# Patient Record
Sex: Male | Born: 1975 | Race: White | Hispanic: No | Marital: Married | Smoking: Never smoker
Health system: Southern US, Community
[De-identification: ages and names within clinical notes are randomized; demographics above are authoritative.]

## PROBLEM LIST (undated history)

## (undated) DIAGNOSIS — J342 Deviated nasal septum: Principal | ICD-10-CM

## (undated) DIAGNOSIS — F419 Anxiety disorder, unspecified: Secondary | ICD-10-CM

## (undated) DIAGNOSIS — E781 Pure hyperglyceridemia: Secondary | ICD-10-CM

## (undated) DIAGNOSIS — F32A Depression, unspecified: Secondary | ICD-10-CM

## (undated) DIAGNOSIS — M5126 Other intervertebral disc displacement, lumbar region: Secondary | ICD-10-CM

## (undated) DIAGNOSIS — Z98811 Dental restoration status: Secondary | ICD-10-CM

## (undated) DIAGNOSIS — F329 Major depressive disorder, single episode, unspecified: Secondary | ICD-10-CM

## (undated) DIAGNOSIS — F429 Obsessive-compulsive disorder, unspecified: Principal | ICD-10-CM

## (undated) DIAGNOSIS — G4730 Sleep apnea, unspecified: Secondary | ICD-10-CM

## (undated) DIAGNOSIS — J343 Hypertrophy of nasal turbinates: Secondary | ICD-10-CM

## (undated) HISTORY — DX: Sleep apnea, unspecified: G47.30

## (undated) HISTORY — DX: Anxiety disorder, unspecified: F41.9

## (undated) HISTORY — DX: Obsessive-compulsive disorder, unspecified: F42.9

## (undated) HISTORY — PX: APPENDECTOMY: SHX54

---

## 2005-09-30 ENCOUNTER — Ambulatory Visit (HOSPITAL_COMMUNITY): Payer: Self-pay | Admitting: Psychiatry

## 2011-03-13 ENCOUNTER — Other Ambulatory Visit: Payer: Self-pay | Admitting: Family Medicine

## 2011-03-13 DIAGNOSIS — R7989 Other specified abnormal findings of blood chemistry: Secondary | ICD-10-CM

## 2011-03-17 ENCOUNTER — Ambulatory Visit
Admission: RE | Admit: 2011-03-17 | Discharge: 2011-03-17 | Disposition: A | Discharge status: Home / Self Care | Payer: BC Managed Care – PPO | Source: Ambulatory Visit | Attending: Family Medicine | Admitting: Family Medicine

## 2011-03-17 DIAGNOSIS — R7989 Other specified abnormal findings of blood chemistry: Secondary | ICD-10-CM

## 2011-04-15 ENCOUNTER — Encounter (HOSPITAL_BASED_OUTPATIENT_CLINIC_OR_DEPARTMENT_OTHER): Payer: BC Managed Care – PPO

## 2011-04-22 ENCOUNTER — Encounter: Payer: Self-pay | Admitting: Family Medicine

## 2011-11-23 ENCOUNTER — Ambulatory Visit (INDEPENDENT_AMBULATORY_CARE_PROVIDER_SITE_OTHER): Payer: 59

## 2011-11-23 DIAGNOSIS — R1013 Epigastric pain: Secondary | ICD-10-CM

## 2011-11-26 ENCOUNTER — Other Ambulatory Visit: Payer: Self-pay | Admitting: Internal Medicine

## 2011-12-28 ENCOUNTER — Other Ambulatory Visit: Payer: Self-pay | Admitting: Family Medicine

## 2011-12-28 DIAGNOSIS — J329 Chronic sinusitis, unspecified: Secondary | ICD-10-CM

## 2011-12-30 ENCOUNTER — Ambulatory Visit
Admission: RE | Admit: 2011-12-30 | Discharge: 2011-12-30 | Disposition: A | Discharge status: Home / Self Care | Payer: No Typology Code available for payment source | Source: Ambulatory Visit | Attending: Family Medicine | Admitting: Family Medicine

## 2011-12-30 DIAGNOSIS — J329 Chronic sinusitis, unspecified: Secondary | ICD-10-CM

## 2012-08-04 ENCOUNTER — Other Ambulatory Visit: Payer: Self-pay

## 2012-08-09 ENCOUNTER — Other Ambulatory Visit: Payer: Self-pay

## 2012-08-23 ENCOUNTER — Other Ambulatory Visit: Payer: Self-pay | Admitting: Family Medicine

## 2012-08-23 ENCOUNTER — Ambulatory Visit
Admission: RE | Admit: 2012-08-23 | Discharge: 2012-08-23 | Disposition: A | Discharge status: Home / Self Care | Payer: BC Managed Care – PPO | Source: Ambulatory Visit | Attending: Family Medicine | Admitting: Family Medicine

## 2012-08-23 DIAGNOSIS — R102 Pelvic and perineal pain unspecified side: Secondary | ICD-10-CM

## 2012-08-23 DIAGNOSIS — N5082 Scrotal pain: Secondary | ICD-10-CM

## 2012-12-27 ENCOUNTER — Encounter: Payer: Self-pay | Admitting: *Deleted

## 2013-01-11 ENCOUNTER — Emergency Department (HOSPITAL_COMMUNITY)
Admission: EM | Admit: 2013-01-11 | Discharge: 2013-01-11 | Disposition: A | Discharge status: Home / Self Care | Payer: No Typology Code available for payment source | Attending: Emergency Medicine | Admitting: Emergency Medicine

## 2013-01-11 ENCOUNTER — Encounter (HOSPITAL_COMMUNITY): Payer: Self-pay | Admitting: Emergency Medicine

## 2013-01-11 DIAGNOSIS — Z79899 Other long term (current) drug therapy: Secondary | ICD-10-CM | POA: Insufficient documentation

## 2013-01-11 DIAGNOSIS — F411 Generalized anxiety disorder: Secondary | ICD-10-CM | POA: Insufficient documentation

## 2013-01-11 DIAGNOSIS — G479 Sleep disorder, unspecified: Secondary | ICD-10-CM | POA: Insufficient documentation

## 2013-01-11 DIAGNOSIS — Z862 Personal history of diseases of the blood and blood-forming organs and certain disorders involving the immune mechanism: Secondary | ICD-10-CM | POA: Insufficient documentation

## 2013-01-11 DIAGNOSIS — F419 Anxiety disorder, unspecified: Secondary | ICD-10-CM

## 2013-01-11 DIAGNOSIS — Z8719 Personal history of other diseases of the digestive system: Secondary | ICD-10-CM | POA: Insufficient documentation

## 2013-01-11 DIAGNOSIS — Z8639 Personal history of other endocrine, nutritional and metabolic disease: Secondary | ICD-10-CM | POA: Insufficient documentation

## 2013-01-11 LAB — URINALYSIS, ROUTINE W REFLEX MICROSCOPIC
Glucose, UA: NEGATIVE mg/dL
Ketones, ur: NEGATIVE mg/dL
Leukocytes, UA: NEGATIVE
Nitrite: NEGATIVE
Protein, ur: NEGATIVE mg/dL
pH: 5.5 (ref 5.0–8.0)

## 2013-01-11 LAB — CBC
HCT: 43.4 % (ref 39.0–52.0)
MCH: 30.7 pg (ref 26.0–34.0)
MCHC: 35.3 g/dL (ref 30.0–36.0)
MCV: 87 fL (ref 78.0–100.0)
Platelets: 222 10*3/uL (ref 150–400)
RDW: 13.1 % (ref 11.5–15.5)

## 2013-01-11 LAB — BASIC METABOLIC PANEL
BUN: 18 mg/dL (ref 6–23)
Calcium: 9.4 mg/dL (ref 8.4–10.5)
Creatinine, Ser: 0.94 mg/dL (ref 0.50–1.35)
GFR calc Af Amer: >90 mL/min (ref >90)

## 2013-01-11 LAB — RAPID URINE DRUG SCREEN, HOSP PERFORMED
Barbiturates: NOT DETECTED
Benzodiazepines: NOT DETECTED

## 2013-01-11 LAB — ETHANOL: Alcohol, Ethyl (B): <11 mg/dL (ref 0–11)

## 2013-01-11 MED ORDER — LORAZEPAM 1 MG PO TABS
1.0000 mg | ORAL_TABLET | Freq: Once | ORAL | Status: AC
  Administered 2013-01-11: 1 mg via ORAL
  Filled 2013-01-11: qty 1

## 2013-01-11 MED ORDER — ONDANSETRON 4 MG PO TBDP
4.0000 mg | ORAL_TABLET | Freq: Once | ORAL | Status: AC
Start: 2013-01-11 — End: 2013-01-11
  Administered 2013-01-11: 4 mg via ORAL
  Filled 2013-01-11: qty 1

## 2013-01-11 MED ORDER — ALPRAZOLAM 0.5 MG PO TABS: 0.5000 mg | ORAL_TABLET | Freq: Three times a day (TID) | ORAL | Status: DC | PRN

## 2013-01-11 NOTE — ED Notes (Signed)
TelePsych faxed and initiated.

## 2013-01-11 NOTE — ED Notes (Signed)
Per EMS, pt felt anxious yesterday with "mood swings" and periods of sadness.  Pt has hx of anxiety since he was 19. Pt used to take Effexor, Ambien PRN.  Pt took Benadryl 50 mg for sleep last night with reverse effects and felt more anxious.  Pt vomited with EMS, Zofran given en route.

## 2013-01-11 NOTE — ED Notes (Signed)
[id number]<AV> Expected date:<BR> Expected time:<BR> Means of arrival:<BR> Comments:<BR> Medic 212, 36 M, Anxiety

## 2013-01-11 NOTE — ED Provider Notes (Signed)
0900.  Received sign-over at 0730.  Pt was awaiting evaluation and recommendations by psychiatrist.  He has denied SI/HI.  He will b discharged home with instructions to f/u with a local mental health professional.  He will be prescribed xanax to be used as needed for acute anxiety/panic.   Tobin Chad, MD 01/11/13 [phone]

## 2013-01-11 NOTE — ED Provider Notes (Signed)
History     CSN: [id number]  Arrival date & time 01/11/13  0309   First MD Initiated Contact with Patient 01/11/13 [phone]      Chief Complaint  Patient presents with  . Anxiety  . Emesis    (Consider location/radiation/quality/duration/timing/severity/associated sxs/prior treatment) HPI History provided by patient. At home tonight had a severe anxiety reaction. He tells me it was so severe he was having thoughts of hurting his wife and family.  No suicidal ideation. He called EMS.  He has not male asleep the last few weeks. he was recently prescribed Ambien by his primary care physician Dr. Tanya Nones [facility].  Medication not working so tonight instead he took Benadryl. Shortly afterwards developed this anxiety reaction - history of same but never this bad.  Has seen a psychologist in the past and was previously on psych medications.  Has remote history of suicidal ideation with psych admission. 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  Past Medical History  Diagnosis Date  . NASH (nonalcoholic steatohepatitis)   . Hyperlipidemia     Past Surgical History  Procedure Laterality Date  . Appendectomy      Family History  Problem Relation Age of Onset  . Heart disease Maternal Uncle 52    heart attack  . Heart disease Maternal Uncle 61    CVA    History  Substance Use Topics  . Smoking status: Never Smoker   . Smokeless tobacco: Not on file  . Alcohol Use: Not on file      Review of Systems  Constitutional: Negative for fever and chills.  HENT: Negative for neck pain and neck stiffness.   Eyes: Negative for pain.  Respiratory: Negative for shortness of breath.   Cardiovascular: Negative for chest pain.  Gastrointestinal: Negative for abdominal pain.  Genitourinary: Negative for dysuria.  Musculoskeletal: Negative for back pain.  Skin: Negative for rash.  Neurological: Negative for headaches.  Psychiatric/Behavioral:  Positive for sleep disturbance. The patient is nervous/anxious.   All other systems reviewed and are negative.    Allergies  Review of patient's allergies indicates no known allergies.  Home Medications   Current Outpatient Rx  Name  Route  Sig  Dispense  Refill  . diphenhydrAMINE (BENADRYL) 25 MG tablet   Oral   Take 25 mg by mouth at bedtime as needed for sleep.         Marland Kitchen venlafaxine (EFFEXOR) 37.5 MG tablet   Oral   Take 75 mg by mouth daily.          Marland Kitchen zolpidem (AMBIEN) 10 MG tablet   Oral   Take 10 mg by mouth at bedtime as needed for sleep.           BP 149/94  Pulse 81  Temp(Src) 98.6 F (37 C) (Oral)  Resp 22  SpO2 96%  Physical Exam  Constitutional: He is oriented to person, place, and time. He appears well-developed and well-nourished.  HENT:  Head: Normocephalic and atraumatic.  Eyes: EOM are normal. Pupils are equal, round, and reactive to light.  Neck: Neck supple.  Cardiovascular: Regular rhythm and intact distal pulses.   Pulmonary/Chest: Effort normal. No respiratory distress.  Musculoskeletal: Normal range of motion. He exhibits no edema.  Neurological: He is alert and oriented to person, place, and time.  Skin: Skin is warm and dry.  Psychiatric: His behavior is normal. Judgment and thought content normal.  Anxious    ED  Course  Procedures (including critical care time)  Results for orders placed during the hospital encounter of 01/11/13  CBC      Result Value Range   WBC 9.0  4.0 - 10.5 K/uL   RBC 4.99  4.22 - 5.81 MIL/uL   Hemoglobin 15.3  13.0 - 17.0 g/dL   HCT 16.1  09.6 - 04.5 %   MCV 87.0  78.0 - 100.0 fL   MCH 30.7  26.0 - 34.0 pg   MCHC 35.3  30.0 - 36.0 g/dL   RDW 40.9  81.1 - 91.4 %   Platelets 222  150 - 400 K/uL    Ativan provided.  5:53 AM discussed with ACT - will evaluate   MDM  Anxiety, sleep disturbance and thoughts of harming family at home  ACT and telepsych consult requested  Labs, UA/  UDS        Sunnie Nielsen, MD 01/11/13 [phone]

## 2013-01-24 ENCOUNTER — Telehealth: Payer: Self-pay | Admitting: Family Medicine

## 2013-01-24 DIAGNOSIS — F419 Anxiety disorder, unspecified: Secondary | ICD-10-CM

## 2013-01-24 MED ORDER — ALPRAZOLAM 0.5 MG PO TABS: 0.5000 mg | ORAL_TABLET | Freq: Three times a day (TID) | ORAL | Status: DC | PRN

## 2013-01-24 NOTE — Telephone Encounter (Signed)
Medication refilled per protocol.Patient needs to be seen before any further refills 

## 2013-01-24 NOTE — Telephone Encounter (Signed)
CPE 11/29/2012  Last refill 01/12/2013 #30.  Xanax 0.5mg  1-2 tabs Q8hrs as needed for panic attacks Need approval for controlled medication.Marland Kitchen

## 2013-01-24 NOTE — Telephone Encounter (Signed)
Ok to call out 30, NTBS if getting worse

## 2013-02-06 ENCOUNTER — Telehealth: Payer: Self-pay | Admitting: Family Medicine

## 2013-02-06 ENCOUNTER — Encounter: Payer: Self-pay | Admitting: Family Medicine

## 2013-02-06 ENCOUNTER — Ambulatory Visit (INDEPENDENT_AMBULATORY_CARE_PROVIDER_SITE_OTHER): Payer: PRIVATE HEALTH INSURANCE | Admitting: Family Medicine

## 2013-02-06 VITALS — BP 120/78 | HR 80 | Temp 101.3°F | Resp 20 | Wt 210.0 lb

## 2013-02-06 DIAGNOSIS — J020 Streptococcal pharyngitis: Secondary | ICD-10-CM

## 2013-02-06 DIAGNOSIS — F419 Anxiety disorder, unspecified: Secondary | ICD-10-CM

## 2013-02-06 DIAGNOSIS — F411 Generalized anxiety disorder: Secondary | ICD-10-CM | POA: Insufficient documentation

## 2013-02-06 LAB — RAPID STREP SCREEN (MED CTR MEBANE ONLY)

## 2013-02-06 MED ORDER — ALPRAZOLAM 0.5 MG PO TABS: 0.5000 mg | ORAL_TABLET | Freq: Three times a day (TID) | ORAL | Status: DC | PRN

## 2013-02-06 MED ORDER — AMOXICILLIN 875 MG PO TABS: 875.0000 mg | ORAL_TABLET | Freq: Two times a day (BID) | ORAL | Status: DC

## 2013-02-06 NOTE — Telephone Encounter (Signed)
Discussed at office visit.

## 2013-02-06 NOTE — Telephone Encounter (Signed)
?  ok to refill °

## 2013-02-06 NOTE — Progress Notes (Signed)
Subjective:     Patient ID: Robert Mcpherson, male   DOB: Mar 09, 1976, 37 y.o.   MRN: [id number]  HPI  Sore throat since Saturday. Fever to 101. Nausea and vomiting. No cough no rhinorrhea. No diarrhea. No rash. Review of Systems  Constitutional: Positive for fever, chills, activity change, appetite change and fatigue.  HENT: Positive for neck pain. Negative for ear pain, congestion, rhinorrhea and sneezing.   Respiratory: Negative.   Cardiovascular: Negative.   Gastrointestinal: Positive for nausea, vomiting and abdominal pain. Negative for diarrhea.  Musculoskeletal: Positive for myalgias.  Skin: Negative.        Objective:   Physical Exam  Constitutional: He appears well-developed and well-nourished.  HENT:  Right Ear: Tympanic membrane normal.  Left Ear: Tympanic membrane normal.  Nose: Nose normal.  Mouth/Throat: Uvula is midline and mucous membranes are normal. Oropharyngeal exudate, posterior oropharyngeal edema and posterior oropharyngeal erythema present. No tonsillar abscesses.       Assessment:    Tonsillitis   Plan:    1. Streptococcal sore throat Amoxicillin 875 mg by mouth twice a day x10 days. - Rapid strep screen #2 anxiety disorder-refill Xanax 0.5 mg by mouth every 8 hours when necessary and gave him 30 pills. Instructed the patient that this is 30 pills and just 2 weeks indicating he's using it twice a day. Stated that when he did come up with a better preventative medicine. When he is not as ill I asked him to come back in so we can discuss better preventative strategies .

## 2013-02-06 NOTE — Telephone Encounter (Signed)
Too soon, that's 2 per day.  We need to discuss better options.  Can have 30 more until he can be seen.

## 2013-02-08 ENCOUNTER — Telehealth: Payer: Self-pay | Admitting: Family Medicine

## 2013-02-08 DIAGNOSIS — R110 Nausea: Secondary | ICD-10-CM

## 2013-02-08 MED ORDER — PROMETHAZINE HCL 25 MG PO TABS: ORAL_TABLET | ORAL | Status: DC

## 2013-02-08 NOTE — Telephone Encounter (Signed)
Ok to call in; Phenergan 25mg   1/2 to 1 Q 4-6hprn nausea. # 30/0. Caution him reg drowsiness. Donot take prior to driving/work.

## 2013-02-08 NOTE — Telephone Encounter (Signed)
Pt called and Rx sent to pharmacy.

## 2013-02-08 NOTE — Telephone Encounter (Signed)
Treated for strep throat on Monday. On Amoxicillin. Throat feeling better but still a lot of nausea with eating.  Diff to eat anything.  Can he have something to calm his stomach down.

## 2013-02-16 ENCOUNTER — Telehealth: Payer: Self-pay | Admitting: Family Medicine

## 2013-02-16 MED ORDER — CIPROFLOXACIN HCL 500 MG PO TABS: 500.0000 mg | ORAL_TABLET | Freq: Two times a day (BID) | ORAL | Status: DC

## 2013-02-16 NOTE — Telephone Encounter (Signed)
cipro 500 bid, for 7 days, ntbs if no better

## 2013-02-16 NOTE — Telephone Encounter (Signed)
Pt called told rx sent to [facility].  If not better after abx will NTBS.

## 2013-02-22 ENCOUNTER — Ambulatory Visit (INDEPENDENT_AMBULATORY_CARE_PROVIDER_SITE_OTHER): Payer: PRIVATE HEALTH INSURANCE | Admitting: Physician Assistant

## 2013-02-22 ENCOUNTER — Encounter: Payer: Self-pay | Admitting: Physician Assistant

## 2013-02-22 VITALS — BP 144/86 | HR 80 | Temp 98.4°F | Resp 20 | Ht 68.25 in | Wt 202.0 lb

## 2013-02-22 DIAGNOSIS — J029 Acute pharyngitis, unspecified: Secondary | ICD-10-CM

## 2013-02-22 DIAGNOSIS — R5383 Other fatigue: Secondary | ICD-10-CM

## 2013-02-22 DIAGNOSIS — R300 Dysuria: Secondary | ICD-10-CM

## 2013-02-22 DIAGNOSIS — R5381 Other malaise: Secondary | ICD-10-CM

## 2013-02-22 LAB — URINALYSIS, MICROSCOPIC ONLY

## 2013-02-22 LAB — CBC WITH DIFFERENTIAL/PLATELET
Basophils Absolute: 0 10*3/uL (ref 0.0–0.1)
Basophils Relative: 1 % (ref 0–1)
Eosinophils Absolute: 0.1 10*3/uL (ref 0.0–0.7)
Eosinophils Relative: 2 % (ref 0–5)
Lymphs Abs: 2.2 10*3/uL (ref 0.7–4.0)
MCH: 30.3 pg (ref 26.0–34.0)
MCHC: 34.7 g/dL (ref 30.0–36.0)
MCV: 87.2 fL (ref 78.0–100.0)
Neutrophils Relative %: 57 % (ref 43–77)
Platelets: 308 10*3/uL (ref 150–400)
RDW: 14.2 % (ref 11.5–15.5)

## 2013-02-22 LAB — COMPLETE METABOLIC PANEL WITH GFR
ALT: 23 U/L (ref 0–53)
Alkaline Phosphatase: 70 U/L (ref 39–117)
CO2: 30 mEq/L (ref 19–32)
Creat: 0.97 mg/dL (ref 0.50–1.35)
GFR, Est African American: >89 mL/min
Total Bilirubin: 0.3 mg/dL (ref 0.3–1.2)

## 2013-02-22 LAB — URINALYSIS, ROUTINE W REFLEX MICROSCOPIC
Bilirubin Urine: NEGATIVE
Glucose, UA: NEGATIVE mg/dL
Protein, ur: NEGATIVE mg/dL
Urobilinogen, UA: 0.2 mg/dL (ref 0.0–1.0)

## 2013-02-22 LAB — TSH: TSH: 1.437 u[IU]/mL (ref 0.350–4.500)

## 2013-02-22 MED ORDER — CIPROFLOXACIN HCL 500 MG PO TABS: 500.0000 mg | ORAL_TABLET | Freq: Two times a day (BID) | ORAL | Status: DC

## 2013-02-23 LAB — MONONUCLEOSIS SCREEN: Mono Screen: NEGATIVE

## 2013-02-23 NOTE — Progress Notes (Signed)
Patient ID: Robert Mcpherson MRN: [id number], DOB: [birth year], 37 y.o. Date of Encounter: @DATE @  Chief Complaint:  Chief Complaint  Patient presents with  . c/o unresolved uti and extreme fatigue    see ph mess last week  . painful intercourse    HPI: 37 y.o. year old male  presents with c/o:  1. On 02/16/13 he called with c/o dysuria. Cipro 500mg  BID x 7 days was called in. (No UA was done). He took that as directed. Took last dose today. Now is no longer has burning with urination but 2 days ago when had sex, hashe had severe pain and burning in his penis with ejaculation. Has seen no penile discharge. Has seen no blood from penis. No testicular pain or tenderness. Knows that he absolutely has been with no one except his wife and he know for certain that she has been with no one else. Refuses to test for STD.   2. Also c/o severe fatigue and exhaustion. 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 "Cant afford to be exhausted."" Wants to check everything possible to check" (except stds-see above). Had strep throat recently. No longer has sore throat but "wants to make sure strep is gone." No other area of pain or problem. No fever/chills.   Past Medical History  Diagnosis Date  . NASH (nonalcoholic steatohepatitis)   . Hyperlipidemia      Home Meds: Current Outpatient Prescriptions on File Prior to Visit  Medication Sig Dispense Refill  . ALPRAZolam (XANAX) 0.5 MG tablet Take 1 tablet (0.5 mg total) by mouth 3 (three) times daily as needed for anxiety.  30 tablet  0  . ciprofloxacin (CIPRO) 500 MG tablet Take 1 tablet (500 mg total) by mouth 2 (two) times daily.  14 tablet  0  . amoxicillin (AMOXIL) 875 MG tablet Take 1 tablet (875 mg total) by mouth 2 (two) times daily.  20 tablet  0  . promethazine (PHENERGAN) 25 MG tablet Take 1/2 to 1 tab every 4-6 hrs as needed for nausea.  30 tablet  0   No current facility-administered medications on file prior to visit.    Allergies: No  Known Allergies  History   Social History  . Marital Status: Married    Spouse Name: N/A    Number of Children: N/A  . Years of Education: N/A   Occupational History  . Not on file.   Social History Main Topics  . Smoking status: Never Smoker   . Smokeless tobacco: Not on file  . Alcohol Use: Not on file  . Drug Use: Not on file  . Sexually Active: Not on file   Other Topics Concern  . Not on file   Social History Narrative  . No narrative on file    Family History  Problem Relation Age of Onset  . Heart disease Maternal Uncle 52    heart attack  . Heart disease Maternal Uncle 72    CVA     Review of Systems: Constitutional: negative for chills, fever, night sweats, weight changes  HEENT: negative for vision changes, hearing loss, congestion, rhinorrhea, ST, epistaxis, or sinus pressure Cardiovascular: negative for chest pain or palpitations. No new/increased shortness of breath or dyspnea on exertion Respiratory: negative for hemoptysis, wheezing, shortness of breath, or cough Abdominal: negative for abdominal pain, nausea, vomiting, diarrhea, or constipation Dermatological: negative for rash or concerning skin lesions Neurologic: negative for headache, dizziness, or syncope All other systems reviewed and are otherwise negative with  the exception to those above and in the HPI.   Physical Exam: Blood pressure 144/86, pulse 80, temperature 98.4 F (36.9 C), temperature source Oral, resp. rate 20, height 5' 8.25" (1.734 m), weight 202 lb (91.627 kg)., Body mass index is 30.47 kg/(m^2). General: Well developed, well nourished,WM in no acute distress. Head: Normocephalic, atraumatic, eyes without discharge, sclera non-icteric, nares are without discharge. Bilateral auditory canals clear, TM's are without perforation, pearly grey and translucent with reflective cone of light bilaterally. Oral cavity moist, posterior pharynx without exudate, erythema, peritonsillar  abscess, or post nasal drip.  Neck: Supple. No thyromegaly. Full ROM. No lymphadenopathy. Lungs: Clear bilaterally to auscultation without wheezes, rales, or rhonchi. Breathing is unlabored. Heart: RRR with S1 S2. No murmurs, rubs, or gallops. Musculoskeletal:  Strength and tone normal for age. Extremities/Skin: Warm and dry. No clubbing or cyanosis. No edema. No rashes or suspicious lesions. Neuro: Alert and oriented X 3. Moves all extremities spontaneously. Gait is normal. CNII-XII grossly in tact. Psych:  Responds to questions appropriately with a normal affect. He is very embarassed to be seeing me and defers exam of genitalia. See HPI. Reports no penile discharge or blood. Reports that testicles are normal with no pain, tenderness, swelling, erythema.     ASSESSMENT AND PLAN:  37 y.o. year old male with  1. Dysuria - Urinalysis, Routine w reflex microscopic - Urine culture - CBC with Differential - ciprofloxacin (CIPRO) 500 MG tablet; Take 1 tablet (500 mg total) by mouth 2 (two) times daily.  Dispense: 20 tablet; Refill: 0 - Ambulatory referral to Urology  2. Other malaise and fatigue - TSH - Mononucleosis screen - CBC with Differential - COMPLETE METABOLIC PANEL WITH GFR  3. Acute pharyngitis - Mononucleosis screen - Throat culture Loney Loh)  Will check labs. Will send urine culture. Will treat with another round of abx. I repeatedly recommended check for STDs and he repeatedly defers. If labs give [place] no explanation to his symptoms, will proceed with urology referral. He is agreeable with this approach.  Murray Hodgkins [place], [place], [facility] 02/23/2013 3:26 PM

## 2013-02-24 LAB — CULTURE, GROUP A STREP

## 2013-02-24 LAB — URINE CULTURE: Colony Count: NO GROWTH

## 2013-02-28 ENCOUNTER — Encounter: Payer: Self-pay | Admitting: Family Medicine

## 2013-02-28 ENCOUNTER — Ambulatory Visit (INDEPENDENT_AMBULATORY_CARE_PROVIDER_SITE_OTHER): Payer: PRIVATE HEALTH INSURANCE | Admitting: Family Medicine

## 2013-02-28 VITALS — BP 120/64 | HR 80 | Temp 98.1°F | Resp 18 | Wt 207.0 lb

## 2013-02-28 DIAGNOSIS — F411 Generalized anxiety disorder: Secondary | ICD-10-CM

## 2013-02-28 MED ORDER — ESCITALOPRAM OXALATE 10 MG PO TABS: 10.0000 mg | ORAL_TABLET | Freq: Every day | ORAL | Status: DC

## 2013-02-28 NOTE — Progress Notes (Signed)
  Subjective:    Patient ID: Robert Mcpherson, male    DOB: Mar 04, 1976, 37 y.o.   MRN: [id number]  HPI Patient is now having he uses Xanax on a daily basis to sleep. He finds that one Xanax at night is not helping him sleep. He states he cannot turn his mind off at night. His mind is constantly ruminating over the days events and and financial stresses.  His wife is going to part-time. He is now the main breadwinner for the family. He has 4 children to care for. 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 He has had panic attacks and sent to the hospital. Past Medical History  Diagnosis Date  . NASH (nonalcoholic steatohepatitis)   . Hyperlipidemia    No current outpatient prescriptions on file prior to visit.   No current facility-administered medications on file prior to visit.   No Known Allergies History   Social History  . Marital Status: Married    Spouse Name: N/A    Number of Children: N/A  . Years of Education: N/A   Occupational History  . Not on file.   Social History Main Topics  . Smoking status: Never Smoker   . Smokeless tobacco: Not on file  . Alcohol Use: Not on file  . Drug Use: Not on file  . Sexually Active: Not on file   Other Topics Concern  . Not on file   Social History Narrative  . No narrative on file      Review of Systems Review of systems is negative    Objective:   Physical Exam  Cardiovascular: Normal rate, regular rhythm and normal heart sounds.   No murmur heard. Pulmonary/Chest: Effort normal and breath sounds normal. No respiratory distress. He has no wheezes. He has no rales. He exhibits no tenderness.  Abdominal: Soft. Bowel sounds are normal. He exhibits no distension. There is no tenderness. There is no rebound and no guarding.          Assessment & Plan:  GAD (generalized anxiety disorder) - Plan: escitalopram (LEXAPRO) 10 MG tablet  Start Lexapro 10 mg by mouth daily. The patient can use  Xanax 1 mg at night for sleep. History of recheck in one month.

## 2013-03-01 ENCOUNTER — Telehealth: Payer: Self-pay | Admitting: Family Medicine

## 2013-03-01 MED ORDER — ALPRAZOLAM 0.5 MG PO TABS: 0.5000 mg | ORAL_TABLET | Freq: Three times a day (TID) | ORAL | Status: DC

## 2013-03-01 NOTE — Telephone Encounter (Signed)
Rx Refilled  

## 2013-03-01 NOTE — Telephone Encounter (Signed)
?   OK to Refill  

## 2013-03-01 NOTE — Telephone Encounter (Signed)
Ok to refill 

## 2013-03-17 ENCOUNTER — Telehealth: Payer: Self-pay | Admitting: Family Medicine

## 2013-03-17 MED ORDER — ALPRAZOLAM 0.5 MG PO TABS: 0.5000 mg | ORAL_TABLET | Freq: Three times a day (TID) | ORAL | Status: DC

## 2013-03-17 NOTE — Telephone Encounter (Signed)
Okay to refill? 

## 2013-03-17 NOTE — Telephone Encounter (Signed)
?   OK to Refill  

## 2013-03-17 NOTE — Telephone Encounter (Signed)
Rx Refilled  

## 2013-06-02 ENCOUNTER — Telehealth: Payer: Self-pay | Admitting: Family Medicine

## 2013-06-02 HISTORY — PX: VASECTOMY: SHX75

## 2013-06-06 ENCOUNTER — Ambulatory Visit (INDEPENDENT_AMBULATORY_CARE_PROVIDER_SITE_OTHER): Payer: PRIVATE HEALTH INSURANCE | Admitting: Physician Assistant

## 2013-06-06 VITALS — BP 120/72 | HR 94 | Temp 98.0°F | Resp 17 | Ht 70.0 in | Wt 215.0 lb

## 2013-06-06 DIAGNOSIS — L509 Urticaria, unspecified: Secondary | ICD-10-CM

## 2013-06-06 DIAGNOSIS — T7840XA Allergy, unspecified, initial encounter: Secondary | ICD-10-CM

## 2013-06-06 MED ORDER — PREDNISONE 20 MG PO TABS: ORAL_TABLET | ORAL | Status: DC

## 2013-06-06 MED ORDER — METHYLPREDNISOLONE SODIUM SUCC 125 MG IJ SOLR
125.0000 mg | Freq: Once | INTRAMUSCULAR | Status: AC
  Administered 2013-06-06: 125 mg via INTRAMUSCULAR

## 2013-06-06 NOTE — Patient Instructions (Addendum)
Start prednisone taper tomorrow Recommend Benadryl 25-50 mg at bedtime and Zyrtec (cetirizine) daily in the morning Call [facility] and follow up with them if needed.  Return or go to ER with any trouble breathing or lip/tongue swelling     Drug Allergy Allergic reactions to medicines are common. Some allergic reactions are mild. A delayed type of drug allergy that occurs 1 week or more after exposure to a medicine or vaccine is called serum sickness. A life-threatening, sudden (acute) allergic reaction that involves the whole body is called anaphylaxis. CAUSES  "True" drug allergies occur when there is an allergic reaction to a medicine. This is caused by overactivity of the immune system. First, the body becomes sensitized. The immune system is triggered by your first exposure to the medicine. Following this first exposure, future exposure to the same medicine may be life-threatening. Almost any medicine can cause an allergic reaction. Common ones are:  Penicillin.  Sulfonamides (sulfa drugs).  Local anesthetics.  X-ray dyes that contain iodine. SYMPTOMS  Common symptoms of a minor allergic reaction are:  Swelling around the mouth.  An itchy red rash or hives.  Vomiting or diarrhea. Anaphylaxis can cause swelling of the mouth and throat. This makes it difficult to breathe and swallow. Severe reactions can be fatal within seconds, even after exposure to only a trace amount of the drug that causes the reaction. HOME CARE INSTRUCTIONS   If you are unsure of what caused your reaction, keep a diary of foods and medicines used. Include the symptoms that followed. Avoid anything that causes reactions.  You may want to follow up with an allergy specialist after the reaction has cleared in order to be tested to confirm the allergy. It is important to confirm that your reaction is an allergy, not just a side effect to the medicine. If you have a true allergy to a medicine, this may  prevent that medicine and related medicines from being given to you when you are very ill.  If you have hives or a rash:  Take medicines as directed by your caregiver.  You may use an over-the-counter antihistamine (diphenhydramine) as needed.  Apply cold compresses to the skin or take baths in cool water. Avoid hot baths or showers.  If you are severely allergic:  Continuous observation after a severe reaction may be needed. Hospitalization is often required.  Wear a medical alert bracelet or necklace stating your allergy.  You and your family must learn how to use an anaphylaxis kit or give an epinephrine injection to temporarily treat an emergency allergic reaction. If you have had a severe reaction, always carry your epinephrine injection or anaphylaxis kit with you. This can be lifesaving if you have a severe reaction.  Do not drive or perform tasks after treatment until the medicines used to treat your reaction have worn off, or until your caregiver says it is okay. SEEK MEDICAL CARE IF:   You think you had an allergic reaction. Symptoms usually start within 30 minutes after exposure.  Symptoms are getting worse rather than better.  You develop new symptoms.  The symptoms that brought you to your caregiver return. SEEK IMMEDIATE MEDICAL CARE IF:   You have swelling of the mouth, difficulty breathing, or wheezing.  You have a tight feeling in your chest or throat.  You develop hives, swelling, or itching all over your body.  You develop severe vomiting or diarrhea.  You feel faint or pass out. This is an emergency. Use  your epinephrine injection or anaphylaxis kit as you have been instructed. Call for emergency medical help. Even if you improve after the injection, you need to be examined at a hospital emergency department. MAKE SURE YOU:   Understand these instructions.  Will watch your condition.  Will get help right away if you are not doing well or get  worse. Document Released: 10/19/2005 Document Revised: 01/11/2012 Document Reviewed: 03/25/2011 [facility] Patient Information 2014 [place], [place].

## 2013-06-06 NOTE — Progress Notes (Signed)
  Subjective:    Patient ID: Robert Mcpherson, male    DOB: 08/18/1976, 37 y.o.   MRN: [id number]  HPI 37 year old male presents with eruption of hives and pruritic rash over his entire body.  Had a vasectomy on 06/02/13 at [facility] and was placed on Keflex and hydrocodone. Has been taking both regularly since the surgery. Pruritis and rash started on 06/03/13 and have progressively worsened.  Stopped Keflex on 06/04/13 but has continued hydrocodone.  Admits the rash still seems to be getting worse. Complains of hives over his entire body.  Called [facility] this a.m but they did not call him back. Has taken several doses of Benadryl which has not seemed to help. Denies SOB, trouble breathing, lip/tongue swelling.  No hx of allergies to medications.  Surgical site doing well. Pain much improved - ok to take tylenol prn.  Patient is otherwise doing well without any concerns today.      Review of Systems  Gastrointestinal: Negative for nausea and vomiting.  Musculoskeletal: Negative for arthralgias.  Skin: Positive for rash.       Objective:   Physical Exam  Constitutional: He is oriented to person, place, and time. He appears well-developed and well-nourished.  HENT:  Head: Normocephalic and atraumatic.  Right Ear: External ear normal.  Left Ear: External ear normal.  Eyes: Conjunctivae are normal.  Neck: Normal range of motion.  Cardiovascular: Normal rate.   Pulmonary/Chest: Effort normal.  Neurological: He is alert and oriented to person, place, and time.  Skin:  Hives diffusely located over entire body.  Superficial abrasions from scratching on bilateral legs. No drainage, warmth, or induration.    Psychiatric: He has a normal mood and affect. His behavior is normal. Judgment and thought content normal.          Assessment & Plan:  .Allergic reaction, initial encounter - Plan: methylPREDNISolone sodium succinate (SOLU-MEDROL) 125 mg/2 mL injection 125 mg  Hives - Plan:  methylPREDNISolone sodium succinate (SOLU-MEDROL) 125 mg/2 mL injection 125 mg, predniSONE (DELTASONE) 20 MG tablet  D/C both hydrocodone and Keflex Call [facility] again tomorrow for follow up instructions Start prednisone taper tomorrow Recommend Zyrtec daily in the morning and Benadryl 25-50 mg at bedtime.   Follow up or go to ER if symptoms worsen or fail to improve.

## 2013-06-06 NOTE — Telephone Encounter (Signed)
Patient can have 30 xanax.  Please call in.

## 2013-06-06 NOTE — Telephone Encounter (Signed)
Med c/o and Patient aware per vm

## 2013-06-16 ENCOUNTER — Other Ambulatory Visit: Payer: Self-pay | Admitting: Family Medicine

## 2013-06-16 ENCOUNTER — Ambulatory Visit (INDEPENDENT_AMBULATORY_CARE_PROVIDER_SITE_OTHER): Payer: PRIVATE HEALTH INSURANCE | Admitting: Family Medicine

## 2013-06-16 ENCOUNTER — Encounter: Payer: Self-pay | Admitting: Family Medicine

## 2013-06-16 ENCOUNTER — Telehealth: Payer: Self-pay | Admitting: Family Medicine

## 2013-06-16 VITALS — BP 110/68 | HR 100 | Temp 99.5°F | Resp 18 | Wt 212.0 lb

## 2013-06-16 DIAGNOSIS — J329 Chronic sinusitis, unspecified: Secondary | ICD-10-CM

## 2013-06-16 DIAGNOSIS — J019 Acute sinusitis, unspecified: Secondary | ICD-10-CM

## 2013-06-16 DIAGNOSIS — F419 Anxiety disorder, unspecified: Secondary | ICD-10-CM | POA: Insufficient documentation

## 2013-06-16 MED ORDER — LEVOFLOXACIN 500 MG PO TABS: 500.0000 mg | ORAL_TABLET | Freq: Every day | ORAL | Status: DC

## 2013-06-16 MED ORDER — METHYLPREDNISOLONE ACETATE 80 MG/ML IJ SUSP
80.0000 mg | Freq: Once | INTRAMUSCULAR | Status: AC
  Administered 2013-06-16: 80 mg via INTRAMUSCULAR

## 2013-06-16 MED ORDER — HYDROCODONE-HOMATROPINE 5-1.5 MG/5ML PO SYRP: 5.0000 mL | ORAL_SOLUTION | Freq: Three times a day (TID) | ORAL | Status: DC | PRN

## 2013-06-16 NOTE — Progress Notes (Signed)
Subjective:    Patient ID: Robert Mcpherson, male    DOB: 25-Jul-1976, 36 y.o.   MRN: [id number]  HPI Patient gets 3 severe sinus infections a year. He is considering sinus surgery through his ENT doctor.  He reports one week of constant sinus pressure, headaches, postnasal drip, nonproductive cough, aches and chills.  He recently had a diffuse allergic reaction to cephalosporin.  He denies any shortness of breath or chest pain. However the cough is keeping him awake at night. He is unable to work and he needs to get back out there and finish several jobs he has pending. Past Medical History  Diagnosis Date  . NASH (nonalcoholic steatohepatitis)   . Hyperlipidemia   . Anxiety     GAD   Current Outpatient Prescriptions on File Prior to Visit  Medication Sig Dispense Refill  . ALPRAZolam (XANAX) 0.5 MG tablet Take 1 tablet (0.5 mg total) by mouth every 8 (eight) hours.  30 tablet  0  . escitalopram (LEXAPRO) 10 MG tablet Take 1 tablet (10 mg total) by mouth daily.  30 tablet  5  . HYDROcodone-acetaminophen (NORCO/VICODIN) 5-325 MG per tablet Take 1 tablet by mouth every 6 (six) hours as needed for pain.       No current facility-administered medications on file prior to visit.   Allergies  Allergen Reactions  . Cephalexin     Hives    History   Social History  . Marital Status: Married    Spouse Name: N/A    Number of Children: N/A  . Years of Education: N/A   Occupational History  . Not on file.   Social History Main Topics  . Smoking status: Never Smoker   . Smokeless tobacco: Not on file  . Alcohol Use: Not on file  . Drug Use: Not on file  . Sexual Activity: Not on file   Other Topics Concern  . Not on file   Social History Narrative  . No narrative on file      Review of Systems  All other systems reviewed and are negative.       Objective:   Physical Exam  Vitals reviewed. Constitutional: He appears well-developed and well-nourished.  HENT:  Right Ear:  External ear normal.  Left Ear: External ear normal.  Nose: Mucosal edema and rhinorrhea present. Right sinus exhibits maxillary sinus tenderness and frontal sinus tenderness. Left sinus exhibits maxillary sinus tenderness and frontal sinus tenderness.  Mouth/Throat: Oropharynx is clear and moist. No oropharyngeal exudate.  Eyes: Conjunctivae are normal. Pupils are equal, round, and reactive to light.  Neck: Neck supple.  Cardiovascular: Normal rate, regular rhythm, normal heart sounds and intact distal pulses.   No murmur heard. Pulmonary/Chest: Effort normal and breath sounds normal. No respiratory distress. He has no wheezes. He has no rales. He exhibits no tenderness.  Lymphadenopathy:    He has no cervical adenopathy.          Assessment & Plan:  1. Acute rhinosinusitis Given his recent allergic reaction, I will try Levaquin 500 mg by mouth daily for 7 days. I also prescribed patient Hycodan cough syrup 1 teaspoon by mouth every 4 hours when necessary cough. I also gave him a shot of 80 mg of Depo-Medrol x1. Recheck next week if no better or sooner if worse. - levofloxacin (LEVAQUIN) 500 MG tablet; Take 1 tablet (500 mg total) by mouth daily.  Dispense: 7 tablet; Refill: 0 - methylPREDNISolone acetate (DEPO-MEDROL) injection 80 mg; Inject 1 mL (  80 mg total) into the muscle once.

## 2013-06-19 NOTE — Telephone Encounter (Signed)
Dr. Pickard aware 

## 2013-07-05 ENCOUNTER — Telehealth: Payer: Self-pay | Admitting: Family Medicine

## 2013-07-05 ENCOUNTER — Ambulatory Visit (INDEPENDENT_AMBULATORY_CARE_PROVIDER_SITE_OTHER): Payer: PRIVATE HEALTH INSURANCE | Admitting: Physician Assistant

## 2013-07-05 ENCOUNTER — Encounter: Payer: Self-pay | Admitting: Physician Assistant

## 2013-07-05 VITALS — BP 144/96 | HR 80 | Temp 98.5°F | Resp 20 | Wt 212.0 lb

## 2013-07-05 DIAGNOSIS — M67919 Unspecified disorder of synovium and tendon, unspecified shoulder: Secondary | ICD-10-CM

## 2013-07-05 DIAGNOSIS — M778 Other enthesopathies, not elsewhere classified: Secondary | ICD-10-CM

## 2013-07-05 MED ORDER — MELOXICAM 7.5 MG PO TABS: 7.5000 mg | ORAL_TABLET | Freq: Every day | ORAL | Status: DC

## 2013-07-05 NOTE — Progress Notes (Signed)
Patient ID: Robert Mcpherson MRN: [id number], DOB: 1976/06/27, 37 y.o. Date of Encounter: 07/05/2013, 5:28 PM    Chief Complaint:  Chief Complaint  Patient presents with  . c/o right shoulder pain getting worse     HPI: 37 y.o. year old white male presents with complaints of right shoulder pain. He said he had an injury to this shoulder when he was a senior in high school. He had a cortisone injection to the shoulder at that time.  He also reports that in 2001 the [employer] [facility]	[place] medically discharged him secondary to right shoulder pain.  However he states that he has never had an MRI or any surgery to the shoulder.  He works doing Aeronautical engineer. He began to develop pain in the right shoulder this spring. He had no known injury or trauma. No acute onset of pain. Instead he had gradual insidious onset of pain.  he has pain in the anterior and posterior aspects of the shoulder. It aches even when he is just sitting at rest. As well anytime he does move against any resistance about shoulder level he has significant pain. Also he has woken with pain during the night.  Home Meds: See attached medication section for any medications that were entered at today's visit. The computer does not put those onto this list.The following list is a list of meds entered prior to today's visit.   Current Outpatient Prescriptions on File Prior to Visit  Medication Sig Dispense Refill  . ALPRAZolam (XANAX) 0.5 MG tablet Take 1 tablet (0.5 mg total) by mouth every 8 (eight) hours.  30 tablet  0  . escitalopram (LEXAPRO) 10 MG tablet Take 1 tablet (10 mg total) by mouth daily.  30 tablet  5  . HYDROcodone-acetaminophen (NORCO/VICODIN) 5-325 MG per tablet Take 1 tablet by mouth every 6 (six) hours as needed for pain.      Marland Kitchen HYDROcodone-homatropine (HYCODAN) 5-1.5 MG/5ML syrup Take 5 mL by mouth every 8 (eight) hours as needed for cough.  120 mL  0  . levofloxacin (LEVAQUIN) 500 MG tablet Take 1 tablet (500 mg total) by mouth  daily.  7 tablet  0   No current facility-administered medications on file prior to visit.    Allergies:  Allergies  Allergen Reactions  . Cephalexin     Hives       Review of Systems: See HPI for pertinent ROS. All other ROS negative.    Physical Exam: Blood pressure 144/96, pulse 80, temperature 98.5 F (36.9 C), temperature source Oral, resp. rate 20, weight 212 lb (96.163 kg)., Body mass index is 30.42 kg/(m^2). General: Well-nourished well-developed white male. Appears in no acute distress. Lungs: Clear bilaterally to auscultation without wheezes, rales, or rhonchi. Breathing is unlabored. Heart: Regular rhythm. No murmurs, rubs, or gallops. Msk:  Strength and tone normal for age. Right shoulder: There is tenderness with palpation at the anterior aspect as well as posterior aspect of the shoulder joint. Forward extension of the shoulder is normal. Empty can is positive. He cannot fully abduct to 90. This causes pain in the anterior and posterior aspects of the shoulder. Forearm strength is 5/5 with both abduction and adduction. Extremities/Skin: Warm and dry.. No edema. Neuro: Alert and oriented X 3. Moves all extremities spontaneously. Gait is normal. CNII-XII grossly in tact. Psych:  Responds to questions appropriately with a normal affect.     ASSESSMENT AND PLAN:  37 y.o. year old male with  1. Tendonitis of shoulder, right I  reassured him that I do not think he has a rotator cuff tear. I gave and reviewed a handout regarding shoulder tendinitis and impingement syndrome. Discussed posture changes as well as position changes during certain activities. Also gave handout with stretches to do. However he states that his wife works with Dr. supple and he really wants to see Dr. supple but his insurance required him to have a referral. Therefore we'll go ahead and do this referral. He is to go ahead and do the stretches in the meantime. As well he will take Mobic daily with food  in the interim he waits for this appointment. - meloxicam (MOBIC) 7.5 MG tablet; Take 1 tablet (7.5 mg total) by mouth daily.  Dispense: 30 tablet; Refill: 0 - Ambulatory referral to Orthopedic Surgery   Signed, Shon Hale Franklin, [place], [facility] 07/05/2013 5:28 PM

## 2013-07-05 NOTE — Telephone Encounter (Signed)
States injury to right shoulder.  Does not know when happened but wants to see Orthopedist.  Wife works at [employer].  Pt wants [place] to put in referral so he can be seen there.  Wants to see Dr. Hosie Poisson, shoulder specialist.  Told patient in order for [place] to make referral we need to see you.  Referring provider expect [place] to provide them with medical notes and primary treatment and or imaging.  Appt given to pt to be seen here

## 2013-07-13 ENCOUNTER — Telehealth: Payer: Self-pay | Admitting: Family Medicine

## 2013-07-13 MED ORDER — ALPRAZOLAM 0.5 MG PO TABS: 0.5000 mg | ORAL_TABLET | Freq: Three times a day (TID) | ORAL | Status: DC

## 2013-07-13 NOTE — Telephone Encounter (Signed)
Xanax 0.5 mg (pt wants a 30 day supply because he uses it when he has trouble sleeping)

## 2013-07-13 NOTE — Telephone Encounter (Signed)
Rx Refilled  

## 2013-07-13 NOTE — Telephone Encounter (Signed)
30 ok

## 2013-07-13 NOTE — Telephone Encounter (Signed)
?   OK to Refill - we had been giving him 30 do you want to increase that?

## 2013-07-31 ENCOUNTER — Telehealth: Payer: Self-pay | Admitting: Family Medicine

## 2013-07-31 MED ORDER — ALPRAZOLAM 0.5 MG PO TABS: 0.5000 mg | ORAL_TABLET | Freq: Three times a day (TID) | ORAL | Status: DC

## 2013-07-31 NOTE — Telephone Encounter (Signed)
ok 

## 2013-07-31 NOTE — Telephone Encounter (Signed)
Rx Refilled  

## 2013-07-31 NOTE — Telephone Encounter (Signed)
?   OK to Refill  

## 2013-07-31 NOTE — Telephone Encounter (Signed)
Xanax 0.5 mg 1 q8 hours prn (pt said that he is completely out of the ones he got on 07/13/13 because sometimes he has to take 2 at night to sleep)

## 2013-08-07 ENCOUNTER — Other Ambulatory Visit: Payer: Self-pay | Admitting: Family Medicine

## 2013-08-10 ENCOUNTER — Encounter: Payer: Self-pay | Admitting: Family Medicine

## 2013-08-10 ENCOUNTER — Ambulatory Visit (INDEPENDENT_AMBULATORY_CARE_PROVIDER_SITE_OTHER): Payer: PRIVATE HEALTH INSURANCE | Admitting: Family Medicine

## 2013-08-10 VITALS — BP 120/78 | HR 72 | Temp 98.2°F | Resp 16 | Ht 70.0 in | Wt 216.0 lb

## 2013-08-10 DIAGNOSIS — G4710 Hypersomnia, unspecified: Secondary | ICD-10-CM

## 2013-08-10 DIAGNOSIS — F411 Generalized anxiety disorder: Secondary | ICD-10-CM

## 2013-08-10 MED ORDER — CLONAZEPAM 0.5 MG PO TABS: 0.5000 mg | ORAL_TABLET | Freq: Two times a day (BID) | ORAL | Status: DC | PRN

## 2013-08-10 NOTE — Progress Notes (Signed)
  Subjective:    Patient ID: Robert Mcpherson, male    DOB: 1976/09/20, 37 y.o.   MRN: [id number]  HPI Patient has a history of generalized anxiety disorder with occasional panic attacks. In the past he tried Effexor which caused sexual side effects and he discontinued the medication. He is also tried Lexapro. However this medicine caused him to feel "numb" and he stopped the medication. Since stopping the Lexapro, his anxiety has worsened. He is now having to use the Xanax twice a day to function. He is here today to discuss options. He also says he feels a lot of his anxiety is due to his inability to sleep. He sleeps sometimes 10 hours a night, but when he wakes, he feels just as tired like he hasn't slept. He has a family history of obstructive sleep apnea. He denies any restless leg syndrome symptoms. His wife is a heavy sleeper and he says she has never complained of him snoring, breathing awkwardly, or moving in his sleep. Past Medical History  Diagnosis Date  . NASH (nonalcoholic steatohepatitis)   . Hyperlipidemia   . Anxiety     GAD   No current outpatient prescriptions on file prior to visit.   No current facility-administered medications on file prior to visit.   Allergies  Allergen Reactions  . Cephalexin     Hives    History   Social History  . Marital Status: Married    Spouse Name: N/A    Number of Children: N/A  . Years of Education: N/A   Occupational History  . Not on file.   Social History Main Topics  . Smoking status: Never Smoker   . Smokeless tobacco: Not on file  . Alcohol Use: Not on file  . Drug Use: Not on file  . Sexual Activity: Not on file   Other Topics Concern  . Not on file   Social History Narrative  . No narrative on file      Review of Systems  All other systems reviewed and are negative.       Objective:   Physical Exam  Vitals reviewed. Cardiovascular: Normal rate and regular rhythm.   Pulmonary/Chest: Effort normal and breath  sounds normal.  Abdominal: Soft. Bowel sounds are normal.  Psychiatric: He has a normal mood and affect. His behavior is normal. Judgment and thought content normal.          Assessment & Plan:  1. GAD (generalized anxiety disorder) Due to the risk of habituation, we will discontinue Xanax and replace with Klonopin 0.5 mg by mouth twice a day. I am hoping that a longer half-life will provide better coverage of his anxiety. Also begin a workup for problem #2. Pending the results of his sleep study, we may consider trying the patient on Wellbutrin. - clonazePAM (KLONOPIN) 0.5 MG tablet; Take 1 tablet (0.5 mg total) by mouth 2 (two) times daily as needed for anxiety.  Dispense: 60 tablet; Refill: 1  2. Hypersomnolence - Ambulatory referral to Sleep Studies

## 2013-08-11 ENCOUNTER — Other Ambulatory Visit: Payer: Self-pay | Admitting: Family Medicine

## 2013-08-11 DIAGNOSIS — G4710 Hypersomnia, unspecified: Secondary | ICD-10-CM

## 2013-09-02 DIAGNOSIS — J342 Deviated nasal septum: Secondary | ICD-10-CM

## 2013-09-02 DIAGNOSIS — J343 Hypertrophy of nasal turbinates: Secondary | ICD-10-CM

## 2013-09-02 HISTORY — DX: Deviated nasal septum: J34.2

## 2013-09-02 HISTORY — DX: Hypertrophy of nasal turbinates: J34.3

## 2013-09-07 ENCOUNTER — Other Ambulatory Visit: Payer: Self-pay

## 2013-09-11 ENCOUNTER — Ambulatory Visit (HOSPITAL_BASED_OUTPATIENT_CLINIC_OR_DEPARTMENT_OTHER): Payer: No Typology Code available for payment source | Attending: Family Medicine

## 2013-09-11 VITALS — Ht 69.0 in | Wt 210.0 lb

## 2013-09-11 DIAGNOSIS — G4710 Hypersomnia, unspecified: Secondary | ICD-10-CM

## 2013-09-11 DIAGNOSIS — R259 Unspecified abnormal involuntary movements: Secondary | ICD-10-CM | POA: Insufficient documentation

## 2013-09-11 DIAGNOSIS — G4733 Obstructive sleep apnea (adult) (pediatric): Secondary | ICD-10-CM | POA: Insufficient documentation

## 2013-09-22 ENCOUNTER — Encounter (HOSPITAL_BASED_OUTPATIENT_CLINIC_OR_DEPARTMENT_OTHER): Payer: Self-pay | Admitting: *Deleted

## 2013-09-23 DIAGNOSIS — G4710 Hypersomnia, unspecified: Secondary | ICD-10-CM

## 2013-09-23 DIAGNOSIS — G4733 Obstructive sleep apnea (adult) (pediatric): Secondary | ICD-10-CM

## 2013-09-24 NOTE — Procedures (Signed)
NAME:  Robert Mcpherson, Robert Mcpherson                 ACCOUNT NO.:  [id number]  MEDICAL RECORD NO.:  [id number]          PATIENT TYPE:  OUT  LOCATION:  SLEEP CENTER                 FACILITY:  [facility]  PHYSICIAN:  Clinton D. Maple Hudson, MD, FCCP, FACPDATE OF BIRTH:  07/15/77  DATE OF STUDY:  09/11/2013                           NOCTURNAL POLYSOMNOGRAM  REFERRING PHYSICIAN:  Georges Mouse II, MD  INDICATION FOR STUDY:  Insomnia with sleep apnea.  EPWORTH SLEEPINESS SCORE:  4/24.  BMI 31.  Weight 210 pounds.  Height 69 inches.  Neck 17.5, inches.  MEDICATIONS:  Home medications are charted for review.  SLEEP ARCHITECTURE:  Total sleep time 283.5 minutes with sleep efficiency 78.3%.  Stage I was 11.1%, stage II 72.8%, stage III 0.2% REM 15.9% of total sleep time.  Sleep latency 45 minutes, REM latency 78 minutes.  Awake after sleep onset 32 minutes, arousal index 11.  BEDTIME MEDICATION:  None.  RESPIRATORY DATA:  Apnea-hypopnea index (AHI) 11.0 per hour.  A total of 52 events was scored, all was hypopneas and mostly while supine.  REM AHI 34.7 per hour.  There were not enough events for split protocol CPAP titration.  OXYGEN DATA:  Moderate snoring with oxygen desaturation to a nadir of 83% and mean oxygen saturation through the study of 93.2% on room air.  CARDIAC DATA:  Normal sinus rhythm.  MOVEMENT-PARASOMNIA:  A total of 20 limb jerks were scored of which 1 was associated with arousal or awakening for periodic limb movements with arousal index of 0.2 per hour.  No bathroom trips.  IMPRESSION-RECOMMENDATION: 1. Unremarkable sleep architecture without bedtime medication. 2. Mild obstructive sleep apnea/hypopnea syndrome, AHI 11 per hour.     Events were hypopneas seen in all sleep positions.  Moderate     snoring with oxygen desaturation to a nadir of 83% and mean oxygen     saturation through the study of 93.2% on room air. 3. There were not enough early events to permit application of  split     protocol CPAP titration.  If appropriate, this patient can return     for dedicated CPAP titration study.     Clinton D. Maple Hudson, MD, [facility], FACP Diplomate, American Board of Sleep Medicine    CDY/MEDQ  D:  09/23/2013 09:31:38  T:  09/24/2013 01:53:54  Job:  [id number]

## 2013-09-27 ENCOUNTER — Encounter: Payer: Self-pay | Admitting: Family Medicine

## 2013-09-27 NOTE — H&P (Signed)
Assessment  Chronic pansinusitis (473.8) (J32.4). 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 Orders  CT Maxillofacial w/o contrast; Requested for: 11 Sep 2013. Discussed  Since his last visit last year, he continues to have chronic nasal obstruction, difficulty breathing, and chronic loss of energy. His fifth child is on the way and he is very concerned that he needs to have better energy levels. On exam, there is a septal deviation up high toward the right causing partial obstruction. No signs of polyps or exudate.   CT is negative for any persistent sinus disease or polyps.   Consider nasal septal surgery with turbinate reduction to help the nasal airways. Risks and benefits discussed in detail. He would like to do this in early in December. Reason For Visit  Pt here for follow up for reoccurring sinus infections. Allergies  Keflex TABS. Current Meds  ClonazePAM TABS;; RPT. Active Problems  Chronic pansinusitis   (473.8) (J32.4). PSH  Appendectomy. Family Hx  Coronary Artery Disease (V17.49) Family history of cerebrovascular accident: Uncle (V17.1) (Z82.3) Family history of diabetes mellitus (V18.0) (Z83.3) Family history of malignant neoplasm: Grandparent (V16.9) (Z80.9) Family history of mental retardation: Uncle (V18.4) (Z81.0) Seasonal allergies: Mother (J30.2). Personal Hx  Alcohol Use (History); 2 drinks or less  a month Caffeine use (V49.89) (F15.929) Marital History - Currently Married Never a smoker (Z78.9). Signature  Electronically signed by : Serena Colonel  M.D.; 09/11/2013 4:07 PM EST.

## 2013-10-02 ENCOUNTER — Ambulatory Visit (HOSPITAL_BASED_OUTPATIENT_CLINIC_OR_DEPARTMENT_OTHER)
Admission: RE | Admit: 2013-10-02 | Discharge: 2013-10-02 | Disposition: A | Discharge status: Home / Self Care | Payer: No Typology Code available for payment source | Source: Ambulatory Visit | Attending: Otolaryngology | Admitting: Otolaryngology

## 2013-10-02 ENCOUNTER — Ambulatory Visit (HOSPITAL_BASED_OUTPATIENT_CLINIC_OR_DEPARTMENT_OTHER): Payer: No Typology Code available for payment source | Admitting: *Deleted

## 2013-10-02 ENCOUNTER — Encounter (HOSPITAL_BASED_OUTPATIENT_CLINIC_OR_DEPARTMENT_OTHER)
Admission: RE | Disposition: A | Discharge status: Home / Self Care | Payer: Self-pay | Source: Ambulatory Visit | Attending: Otolaryngology

## 2013-10-02 ENCOUNTER — Encounter (HOSPITAL_BASED_OUTPATIENT_CLINIC_OR_DEPARTMENT_OTHER): Payer: Self-pay

## 2013-10-02 ENCOUNTER — Encounter (HOSPITAL_BASED_OUTPATIENT_CLINIC_OR_DEPARTMENT_OTHER): Payer: No Typology Code available for payment source | Admitting: *Deleted

## 2013-10-02 DIAGNOSIS — J328 Other chronic sinusitis: Secondary | ICD-10-CM | POA: Insufficient documentation

## 2013-10-02 DIAGNOSIS — J342 Deviated nasal septum: Secondary | ICD-10-CM | POA: Insufficient documentation

## 2013-10-02 DIAGNOSIS — J343 Hypertrophy of nasal turbinates: Secondary | ICD-10-CM | POA: Insufficient documentation

## 2013-10-02 HISTORY — PX: NASAL SEPTOPLASTY W/ TURBINOPLASTY: SHX2070

## 2013-10-02 HISTORY — DX: Hypertrophy of nasal turbinates: J34.3

## 2013-10-02 HISTORY — DX: Deviated nasal septum: J34.2

## 2013-10-02 HISTORY — DX: Dental restoration status: Z98.811

## 2013-10-02 LAB — POCT HEMOGLOBIN-HEMACUE: Hemoglobin: 14.8 g/dL (ref 13.0–17.0)

## 2013-10-02 SURGERY — SEPTOPLASTY, NOSE, WITH NASAL TURBINATE REDUCTION
Anesthesia: General | Site: Nose | Laterality: Bilateral | Wound class: Clean Contaminated

## 2013-10-02 MED ORDER — CLINDAMYCIN HCL 300 MG PO CAPS: 300.0000 mg | ORAL_CAPSULE | Freq: Three times a day (TID) | ORAL | Status: DC

## 2013-10-02 MED ORDER — BACITRACIN ZINC 500 UNIT/GM EX OINT
TOPICAL_OINTMENT | CUTANEOUS | Status: AC
  Filled 2013-10-02: qty 0.9

## 2013-10-02 MED ORDER — FENTANYL CITRATE 0.05 MG/ML IJ SOLN: 50.0000 ug | INTRAMUSCULAR | Status: DC | PRN

## 2013-10-02 MED ORDER — FENTANYL CITRATE 0.05 MG/ML IJ SOLN
INTRAMUSCULAR | Status: DC | PRN
  Administered 2013-10-02: 50 ug via INTRAVENOUS
  Administered 2013-10-02: 100 ug via INTRAVENOUS
  Administered 2013-10-02: 50 ug via INTRAVENOUS

## 2013-10-02 MED ORDER — OXYCODONE HCL 5 MG PO TABS: 5.0000 mg | ORAL_TABLET | Freq: Once | ORAL | Status: DC | PRN

## 2013-10-02 MED ORDER — LIDOCAINE-EPINEPHRINE 1 %-1:100000 IJ SOLN
INTRAMUSCULAR | Status: AC
  Filled 2013-10-02: qty 1

## 2013-10-02 MED ORDER — OXYMETAZOLINE HCL 0.05 % NA SOLN
NASAL | Status: DC | PRN
  Administered 2013-10-02: 1 via NASAL

## 2013-10-02 MED ORDER — ONDANSETRON HCL 4 MG/2ML IJ SOLN
INTRAMUSCULAR | Status: DC | PRN
  Administered 2013-10-02: 4 mg via INTRAVENOUS

## 2013-10-02 MED ORDER — OXYCODONE HCL 5 MG/5ML PO SOLN: 5.0000 mg | Freq: Once | ORAL | Status: DC | PRN

## 2013-10-02 MED ORDER — FENTANYL CITRATE 0.05 MG/ML IJ SOLN
INTRAMUSCULAR | Status: AC
  Filled 2013-10-02: qty 4

## 2013-10-02 MED ORDER — BACITRACIN ZINC 500 UNIT/GM EX OINT
TOPICAL_OINTMENT | CUTANEOUS | Status: DC | PRN
  Administered 2013-10-02: 1 via TOPICAL

## 2013-10-02 MED ORDER — BACITRACIN ZINC 500 UNIT/GM EX OINT
TOPICAL_OINTMENT | CUTANEOUS | Status: AC
  Filled 2013-10-02: qty 28.35

## 2013-10-02 MED ORDER — PROMETHAZINE HCL 25 MG/ML IJ SOLN: 6.2500 mg | INTRAMUSCULAR | Status: DC | PRN

## 2013-10-02 MED ORDER — CLINDAMYCIN PHOSPHATE 600 MG/50ML IV SOLN
INTRAVENOUS | Status: AC
  Filled 2013-10-02: qty 50

## 2013-10-02 MED ORDER — HYDROMORPHONE HCL PF 1 MG/ML IJ SOLN
INTRAMUSCULAR | Status: AC
  Filled 2013-10-02: qty 1

## 2013-10-02 MED ORDER — MIDAZOLAM HCL 2 MG/ML PO SYRP: 12.0000 mg | ORAL_SOLUTION | Freq: Once | ORAL | Status: DC | PRN

## 2013-10-02 MED ORDER — DEXAMETHASONE SODIUM PHOSPHATE 4 MG/ML IJ SOLN
INTRAMUSCULAR | Status: DC | PRN
  Administered 2013-10-02: 10 mg via INTRAVENOUS

## 2013-10-02 MED ORDER — LIDOCAINE-EPINEPHRINE 1 %-1:100000 IJ SOLN
INTRAMUSCULAR | Status: DC | PRN
  Administered 2013-10-02: 4 mL

## 2013-10-02 MED ORDER — SUCCINYLCHOLINE CHLORIDE 20 MG/ML IJ SOLN
INTRAMUSCULAR | Status: DC | PRN
  Administered 2013-10-02: 140 mg via INTRAVENOUS

## 2013-10-02 MED ORDER — HYDROCODONE-ACETAMINOPHEN 7.5-325 MG PO TABS: 1.0000 | ORAL_TABLET | Freq: Four times a day (QID) | ORAL | Status: DC | PRN

## 2013-10-02 MED ORDER — OXYMETAZOLINE HCL 0.05 % NA SOLN
NASAL | Status: AC
  Filled 2013-10-02: qty 15

## 2013-10-02 MED ORDER — CLINDAMYCIN PHOSPHATE 600 MG/50ML IV SOLN
600.0000 mg | INTRAVENOUS | Status: AC
  Administered 2013-10-02: 600 mg via INTRAVENOUS

## 2013-10-02 MED ORDER — LIDOCAINE HCL (CARDIAC) 20 MG/ML IV SOLN
INTRAVENOUS | Status: DC | PRN
  Administered 2013-10-02 (×2): 50 mg via INTRAVENOUS

## 2013-10-02 MED ORDER — HYDROMORPHONE HCL PF 1 MG/ML IJ SOLN
0.2500 mg | INTRAMUSCULAR | Status: DC | PRN
  Administered 2013-10-02 (×2): 0.5 mg via INTRAVENOUS

## 2013-10-02 MED ORDER — PROPOFOL 10 MG/ML IV BOLUS
INTRAVENOUS | Status: DC | PRN
  Administered 2013-10-02: 180 mg via INTRAVENOUS
  Administered 2013-10-02: 20 mg via INTRAVENOUS

## 2013-10-02 MED ORDER — LACTATED RINGERS IV SOLN
INTRAVENOUS | Status: DC
  Administered 2013-10-02 (×2): via INTRAVENOUS

## 2013-10-02 MED ORDER — SUCCINYLCHOLINE CHLORIDE 20 MG/ML IJ SOLN
INTRAMUSCULAR | Status: AC
  Filled 2013-10-02: qty 1

## 2013-10-02 MED ORDER — MIDAZOLAM HCL 5 MG/5ML IJ SOLN
INTRAMUSCULAR | Status: DC | PRN
  Administered 2013-10-02: 2 mg via INTRAVENOUS

## 2013-10-02 MED ORDER — MIDAZOLAM HCL 2 MG/2ML IJ SOLN: 1.0000 mg | INTRAMUSCULAR | Status: DC | PRN

## 2013-10-02 MED ORDER — PROPOFOL 10 MG/ML IV EMUL
INTRAVENOUS | Status: AC
  Filled 2013-10-02: qty 50

## 2013-10-02 MED ORDER — MIDAZOLAM HCL 2 MG/2ML IJ SOLN
INTRAMUSCULAR | Status: AC
  Filled 2013-10-02: qty 2

## 2013-10-02 MED ORDER — PROMETHAZINE HCL 25 MG RE SUPP: 25.0000 mg | Freq: Four times a day (QID) | RECTAL | Status: DC | PRN

## 2013-10-02 MED ORDER — OXYMETAZOLINE HCL 0.05 % NA SOLN
2.0000 | NASAL | Status: DC
  Administered 2013-10-02: 2 via NASAL

## 2013-10-02 SURGICAL SUPPLY — 28 items
ATTRACTOMAT 16X20 MAGNETIC DRP (DRAPES) IMPLANT
CANISTER SUCT 1200ML W/VALVE (MISCELLANEOUS) ×2 IMPLANT
DECANTER SPIKE VIAL GLASS SM (MISCELLANEOUS) IMPLANT
DRSG NASOPORE 8CM (GAUZE/BANDAGES/DRESSINGS) IMPLANT
DRSG TELFA 3X8 NADH (GAUZE/BANDAGES/DRESSINGS) ×2 IMPLANT
GAUZE SPONGE 4X4 16PLY XRAY LF (GAUZE/BANDAGES/DRESSINGS) IMPLANT
GLOVE BIOGEL PI IND STRL 7.0 (GLOVE) IMPLANT
GLOVE BIOGEL PI INDICATOR 7.0 (GLOVE) ×1
GLOVE ECLIPSE 6.5 STRL STRAW (GLOVE) ×1 IMPLANT
GLOVE ECLIPSE 7.5 STRL STRAW (GLOVE) ×2 IMPLANT
GOWN PREVENTION PLUS XLARGE (GOWN DISPOSABLE) ×4 IMPLANT
HEMOSTAT SURGICEL .5X2 ABSORB (HEMOSTASIS) IMPLANT
NEEDLE 27GAX1X1/2 (NEEDLE) ×2 IMPLANT
NS IRRIG 1000ML POUR BTL (IV SOLUTION) ×1 IMPLANT
PACK BASIN DAY SURGERY FS (CUSTOM PROCEDURE TRAY) ×2 IMPLANT
PACK ENT DAY SURGERY (CUSTOM PROCEDURE TRAY) ×2 IMPLANT
PAD DRESSING TELFA 3X8 NADH (GAUZE/BANDAGES/DRESSINGS) IMPLANT
PATTIES SURGICAL .5 X3 (DISPOSABLE) ×2 IMPLANT
SHEET SILASTIC 8X6X.030 25-30 (MISCELLANEOUS) IMPLANT
SPONGE GAUZE 2X2 8PLY STRL LF (GAUZE/BANDAGES/DRESSINGS) ×3 IMPLANT
STRIP CLOSURE SKIN 1/2X4 (GAUZE/BANDAGES/DRESSINGS) IMPLANT
SUT CHROMIC 4 0 P 3 18 (SUTURE) ×2 IMPLANT
SUT ETHILON 3 0 PS 1 (SUTURE) IMPLANT
SUT ETHILON 4 0 CL P 3 (SUTURE) IMPLANT
SUT ETHILON 6 0 P 1 (SUTURE) IMPLANT
SUT PLAIN 4 0 ~~LOC~~ 1 (SUTURE) ×2 IMPLANT
TOWEL OR 17X24 6PK STRL BLUE (TOWEL DISPOSABLE) ×2 IMPLANT
YANKAUER SUCT BULB TIP NO VENT (SUCTIONS) ×2 IMPLANT

## 2013-10-02 NOTE — Interval H&P Note (Signed)
History and Physical Interval Note:  10/02/2013 7:23 AM  Robert Mcpherson  has presented today for surgery, with the diagnosis of DEVIATED NASAL SEPTUM/TURBINATE HYPERTROPHY  The various methods of treatment have been discussed with the patient and family. After consideration of risks, benefits and other options for treatment, the patient has consented to  Procedure(s): BILATERAL NASAL SEPTOPLASTY WITH TURBINATE REDUCTION (Bilateral) as a surgical intervention .  The patient's history has been reviewed, patient examined, no change in status, stable for surgery.  I have reviewed the patient's chart and labs.  Questions were answered to the patient's satisfaction.     Ardella Chhim

## 2013-10-02 NOTE — Transfer of Care (Signed)
Immediate Anesthesia Transfer of Care Note  Patient: Robert Mcpherson  Procedure(s) Performed: Procedure(s): BILATERAL NASAL SEPTOPLASTY WITH TURBINATE REDUCTION (Bilateral)  Patient Location: PACU  Anesthesia Type:General  Level of Consciousness: awake, alert , oriented and patient cooperative  Airway & Oxygen Therapy: Patient Spontanous Breathing and Patient connected to face mask oxygen  Post-op Assessment: Report given to PACU RN, Post -op Vital signs reviewed and stable and Patient moving all extremities  Post vital signs: Reviewed and stable  Complications: No apparent anesthesia complications

## 2013-10-02 NOTE — Op Note (Signed)
OPERATIVE REPORT  DATE OF SURGERY: 10/02/2013  PATIENT:  Robert Mcpherson,  37 y.o. male  PRE-OPERATIVE DIAGNOSIS:  DEVIATED NASAL SEPTUM/TURBINATE HYPERTROPHY  POST-OPERATIVE DIAGNOSIS:  DEVIATED NASAL SEPTUM/TURBINATE HYPERTROPHY  PROCEDURE:  Procedure(s): BILATERAL NASAL SEPTOPLASTY WITH TURBINATE REDUCTION  SURGEON:  Susy Frizzle, MD  ASSISTANTS: none  ANESTHESIA:   General   EBL:  10 ml  DRAINS: none  LOCAL MEDICATIONS USED:  1% Xylocaine with epinephrine  SPECIMEN:  none  COUNTS:  Correct  PROCEDURE DETAILS: The patient was taken to the operating room and placed on the operating table in the supine position. Following induction of general endotracheal anesthesia, the table was positioned. Face was prepped and draped in a standard fashion. Afrin spray was used preoperatively. 1% Xylocaine with epinephrine was infiltrated into the septum on both sides, columella and the inferior turbinates bilaterally. Afrin pledges were placed in the nasal cavities bilaterally.  #1-septoplasty. A left hemitransfixion incision was used to approach the septal cartilage. A Cottle elevator was used to elevate mucosa posteriorly down the left side all the way to the sphenoid rostrum. There is a significant band in the bony septum towards the left side causing major obstruction. There is also a curvature of the maxillary crest causing a large spur posteriorly down the left side. There is an additional anterior maxillary crest spur with corresponding cartilaginous deflection to the right side anteriorly. This was approached through a separate incision on the right side about 1/2 cm posterior to the left-sided incision. Ethmoid plate  bone, maxillary crest  bone, vomer  bone, and small fragment of posterior quadrangular cartilage were all resected. This  allowed the septum to lie in midline position opening of the airways bilaterally. The left-sided incision was reapproximated with chromic suture. A  quilting stitch was placed to reapposed the septal flaps bilaterally.  #2-submucous resection inferior turbinates. The leading edge of inferior turbinates was incised in a vertical fashion with a 15 scalpel. Cottle elevator was used to elevate mucosa off bone in all directions. Bony fragments were crushed and small pieces were removed. Turbinate remnants were outfractured bilaterally. Nasal cavities were suctioned of blood and secretions and packed with rolled up Telfa coated with bacitracin ointment. Pharynx was suctioned of blood and secretions. Patient was awakened extubated and transferred to recovery in stable condition.    PATIENT DISPOSITION:  To PACU, stable

## 2013-10-02 NOTE — Anesthesia Postprocedure Evaluation (Signed)
  Anesthesia Post-op Note  Patient: Robert Mcpherson  Procedure(s) Performed: Procedure(s): BILATERAL NASAL SEPTOPLASTY WITH TURBINATE REDUCTION (Bilateral)  Patient Location: PACU  Anesthesia Type:General  Level of Consciousness: awake and alert   Airway and Oxygen Therapy: Patient Spontanous Breathing  Post-op Pain: mild  Post-op Assessment: Post-op Vital signs reviewed  Post-op Vital Signs: stable  Complications: No apparent anesthesia complications

## 2013-10-02 NOTE — Anesthesia Preprocedure Evaluation (Addendum)
Anesthesia Evaluation  Patient identified by MRN, date of birth, ID band Patient awake    Reviewed: Allergy & Precautions, H&P , NPO status , Patient's Chart, lab work & pertinent test results  History of Anesthesia Complications Negative for: history of anesthetic complications  Airway Mallampati: I      Dental  (+) Teeth Intact, Loose, Dental Advisory Given and Caps,    Pulmonary neg pulmonary ROS,  breath sounds clear to auscultation        Cardiovascular negative cardio ROS  Rhythm:Regular Rate:Normal     Neuro/Psych negative neurological ROS     GI/Hepatic   Endo/Other    Renal/GU      Musculoskeletal   Abdominal   Peds  Hematology   Anesthesia Other Findings Upper R cap is very loose  Reproductive/Obstetrics                          Anesthesia Physical Anesthesia Plan  ASA: I  Anesthesia Plan: General   Post-op Pain Management:    Induction: Intravenous  Airway Management Planned: LMA and Oral ETT  Additional Equipment:   Intra-op Plan:   Post-operative Plan: Extubation in OR  Informed Consent: I have reviewed the patients History and Physical, chart, labs and discussed the procedure including the risks, benefits and alternatives for the proposed anesthesia with the patient or authorized representative who has indicated his/her understanding and acceptance.     Plan Discussed with: CRNA  Anesthesia Plan Comments:         Anesthesia Quick Evaluation

## 2013-10-02 NOTE — Anesthesia Procedure Notes (Signed)
Procedure Name: Intubation Date/Time: 10/02/2013 7:52 AM Performed by: Serena Colonel Pre-anesthesia Checklist: Patient identified, Emergency Drugs available, Suction available and Patient being monitored Patient Re-evaluated:Patient Re-evaluated prior to inductionOxygen Delivery Method: Circle System Utilized Preoxygenation: Pre-oxygenation with 100% oxygen Intubation Type: IV induction Ventilation: Mask ventilation without difficulty Laryngoscope Size: 4 Tube type: Oral Tube size: 8.0 mm Number of attempts: 1 Airway Equipment and Method: stylet and Video-laryngoscopy (gauze bite block placed, teeth unchanged) Placement Confirmation: ETT inserted through vocal cords under direct vision,  positive ETCO2 and breath sounds checked- equal and bilateral Secured at: 23 cm Tube secured with: Tape Dental Injury: Teeth and Oropharynx as per pre-operative assessment

## 2013-10-03 ENCOUNTER — Encounter (HOSPITAL_BASED_OUTPATIENT_CLINIC_OR_DEPARTMENT_OTHER): Payer: Self-pay | Admitting: Otolaryngology

## 2013-10-10 ENCOUNTER — Other Ambulatory Visit: Payer: Self-pay | Admitting: Family Medicine

## 2013-10-10 NOTE — Telephone Encounter (Signed)
Last RF 10/9 #60 + 1  Last OV 10/9  OK refill?

## 2013-10-10 NOTE — Telephone Encounter (Signed)
ok 

## 2013-10-11 ENCOUNTER — Ambulatory Visit: Payer: PRIVATE HEALTH INSURANCE | Admitting: Physician Assistant

## 2013-11-02 HISTORY — PX: SHOULDER ARTHROSCOPY: SHX128

## 2013-11-11 ENCOUNTER — Other Ambulatory Visit: Payer: Self-pay | Admitting: Family Medicine

## 2013-11-13 NOTE — Telephone Encounter (Signed)
Ok to refill 

## 2013-11-15 NOTE — Telephone Encounter (Signed)
?   OK to Refill  

## 2013-11-16 ENCOUNTER — Telehealth: Payer: Self-pay | Admitting: Family Medicine

## 2013-11-16 NOTE — Telephone Encounter (Signed)
Med called out to pharm 

## 2013-11-16 NOTE — Telephone Encounter (Signed)
ok 

## 2013-11-16 NOTE — Telephone Encounter (Signed)
PT is needing a refill on his Klonopin Call back number is [phone]

## 2013-11-24 ENCOUNTER — Ambulatory Visit (INDEPENDENT_AMBULATORY_CARE_PROVIDER_SITE_OTHER): Payer: BC Managed Care – PPO | Admitting: Family Medicine

## 2013-11-24 ENCOUNTER — Encounter: Payer: Self-pay | Admitting: Family Medicine

## 2013-11-24 VITALS — BP 136/78 | HR 76 | Temp 97.3°F | Resp 16 | Ht 70.0 in | Wt 228.0 lb

## 2013-11-24 DIAGNOSIS — H6590 Unspecified nonsuppurative otitis media, unspecified ear: Secondary | ICD-10-CM

## 2013-11-24 MED ORDER — FLUTICASONE PROPIONATE 50 MCG/ACT NA SUSP: 2.0000 | Freq: Every day | NASAL | Status: DC

## 2013-11-24 MED ORDER — AMOXICILLIN-POT CLAVULANATE 875-125 MG PO TABS: 1.0000 | ORAL_TABLET | Freq: Two times a day (BID) | ORAL | Status: DC

## 2013-11-24 NOTE — Progress Notes (Signed)
Subjective:    Patient ID: Robert Mcpherson, male    DOB: [birth year], 38 y.o.   MRN: [id number]  HPI Patient was seen in urgent care one week ago and was given amoxicillin for a sore throat. The patient developed a sore throat. That subsequently developed into rhinorrhea and nasal congestion as well as a cough that is nonproductive. Patient also developed body aches. His son was also having similar symptoms. It sounds like a flulike illness. Overall he is improving. However he complains of pain and pressure in his right ear.  He states it feels like his ear is under water. He cannot hear as well his right ear. He denies any fevers. No sinus pain. He denies any sore throat at the present time. He denies any headaches. Past Medical History  Diagnosis Date  . Deviated nasal septum 09/2013  . Nasal turbinate hypertrophy 09/2013  . Dental crown present   . Anxiety    Current Outpatient Prescriptions on File Prior to Visit  Medication Sig Dispense Refill  . clonazePAM (KLONOPIN) 0.5 MG tablet TAKE 1 TABLET BY MOUTH TWICE DAILY AS NEEDED FOR ANXIETY  60 tablet  0   No current facility-administered medications on file prior to visit.   Allergies  Allergen Reactions  . Cephalexin Hives   History   Social History  . Marital Status: Married    Spouse Name: N/A    Number of Children: N/A  . Years of Education: N/A   Occupational History  . Not on file.   Social History Main Topics  . Smoking status: Never Smoker   . Smokeless tobacco: Never Used  . Alcohol Use: No  . Drug Use: No  . Sexual Activity: Not on file   Other Topics Concern  . Not on file   Social History Narrative  . No narrative on file      Review of Systems  All other systems reviewed and are negative.       Objective:   Physical Exam  Vitals reviewed. Constitutional: He appears well-developed and well-nourished.  HENT:  Right Ear: External ear and ear canal normal. Tympanic membrane is bulging. A middle  ear effusion is present.  Left Ear: Tympanic membrane and ear canal normal.  Nose: Mucosal edema and rhinorrhea present. Epistaxis is observed. Right sinus exhibits no maxillary sinus tenderness and no frontal sinus tenderness. Left sinus exhibits no maxillary sinus tenderness and no frontal sinus tenderness.  Mouth/Throat: Oropharynx is clear and moist. No oropharyngeal exudate.  Cardiovascular: Normal rate, regular rhythm and normal heart sounds.   No murmur heard. Pulmonary/Chest: Effort normal and breath sounds normal. No respiratory distress. He has no wheezes. He has no rales.  Abdominal: Soft. Bowel sounds are normal. He exhibits no distension. There is no tenderness. There is no rebound and no guarding.          Assessment & Plan:  1. Serous otitis media I believe the patient likely had a flulike illness. I explained to the patient to allow tincture of time for this to totally improve. He is having ultrasound for possibly 7 days. Anticipate spontaneous resolution in 3 more days.  However the patient has developed assess effusion behind his eardrum. Recommended Sudafed 60 mg every 6 hours for congestion, I recommended Flonase 2 sprays each nostril daily, I recommended Claritin 10 mg by mouth daily. I hope this will help dry up the effusion by calming down the swelling and inflammation in his sinuses. If the patient develops  a fever or any pain in his right ear he is to start taking Augmentin for possible ear infection - fluticasone (FLONASE) 50 MCG/ACT nasal spray; Place 2 sprays into both nostrils daily.  Dispense: 16 g; Refill: 6 - amoxicillin-clavulanate (AUGMENTIN) 875-125 MG per tablet; Take 1 tablet by mouth 2 (two) times daily.  Dispense: 20 tablet; Refill: 0

## 2013-12-20 ENCOUNTER — Telehealth: Payer: Self-pay | Admitting: *Deleted

## 2013-12-20 NOTE — Telephone Encounter (Signed)
?   OK to Refill  

## 2013-12-20 NOTE — Telephone Encounter (Signed)
Received VM. Call returned.    Reported that pharmacy requires refill on Clonazepam.

## 2013-12-21 MED ORDER — CLONAZEPAM 0.5 MG PO TABS: ORAL_TABLET | ORAL | Status: DC

## 2013-12-21 NOTE — Telephone Encounter (Signed)
Rx Refilled  

## 2013-12-21 NOTE — Telephone Encounter (Signed)
ok 

## 2014-01-18 ENCOUNTER — Ambulatory Visit (INDEPENDENT_AMBULATORY_CARE_PROVIDER_SITE_OTHER): Payer: BC Managed Care – PPO | Admitting: Physician Assistant

## 2014-01-18 ENCOUNTER — Encounter: Payer: Self-pay | Admitting: Physician Assistant

## 2014-01-18 VITALS — BP 122/82 | HR 60 | Temp 97.8°F | Resp 18 | Ht 69.5 in | Wt 226.0 lb

## 2014-01-18 DIAGNOSIS — H6590 Unspecified nonsuppurative otitis media, unspecified ear: Secondary | ICD-10-CM

## 2014-01-18 DIAGNOSIS — L259 Unspecified contact dermatitis, unspecified cause: Secondary | ICD-10-CM

## 2014-01-18 DIAGNOSIS — J309 Allergic rhinitis, unspecified: Secondary | ICD-10-CM

## 2014-01-18 DIAGNOSIS — L239 Allergic contact dermatitis, unspecified cause: Secondary | ICD-10-CM

## 2014-01-18 MED ORDER — CETIRIZINE HCL 10 MG PO TABS: 10.0000 mg | ORAL_TABLET | Freq: Every day | ORAL | Status: DC

## 2014-01-18 MED ORDER — METHYLPREDNISOLONE ACETATE 80 MG/ML IJ SUSP
80.0000 mg | Freq: Once | INTRAMUSCULAR | Status: AC
  Administered 2014-01-18: 80 mg via INTRAMUSCULAR

## 2014-01-18 MED ORDER — FLUTICASONE PROPIONATE 50 MCG/ACT NA SUSP: 2.0000 | Freq: Every day | NASAL | Status: DC

## 2014-01-18 NOTE — Progress Notes (Signed)
Patient ID: JAHAD OLD MRN: [id number], DOB: 01-25-1976, 38 y.o. Date of Encounter: 01/18/2014, 3:49 PM    Chief Complaint:  Chief Complaint  Patient presents with  . 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   Says he knows he is allergic to cedar trees. Says they are Blooming right now.  Says that his eyes have been itchy and watery. His nose is  congested and stopped up. Also having sneezing.  Says he doesn't know what he got into but his arms or itching like crazy with itchy rash for the past 4 days. Says he has been spreading pine straw and thinks some poison oak or poison ivy must have been in there. Using Calamyne lotion, Benadryl and hydrocortisone cream- with no relief.     Home Meds: See attached medication section for any medications that were entered at today's visit. The computer does not put those onto this list.The following list is a list of meds entered prior to today's visit.   Current Outpatient Prescriptions on File Prior to Visit  Medication Sig Dispense Refill  . clonazePAM (KLONOPIN) 0.5 MG tablet TAKE 1 TABLET BY MOUTH TWICE DAILY AS NEEDED FOR ANXIETY  60 tablet  2  . naproxen (NAPROSYN) 250 MG tablet Take 250 mg by mouth as needed.        No current facility-administered medications on file prior to visit.    Allergies:  Allergies  Allergen Reactions  . Cephalexin Hives      Review of Systems: See HPI for pertinent ROS. All other ROS negative.    Physical Exam: Blood pressure 122/82, pulse 60, temperature 97.8 F (36.6 C), temperature source Oral, resp. rate 18, height 5' 9.5" (1.765 m), weight 226 lb (102.513 kg)., Body mass index is 32.91 kg/(m^2). General: WNWD WM. Appears in no acute distress. HEENT: Normocephalic, atraumatic, eyes without discharge, sclera non-icteric, nares are without discharge. Bilateral auditory canals clear, TM's are  without perforation, pearly grey and translucent with reflective cone of light bilaterally. Oral cavity moist, posterior pharynx without exudate, erythema, peritonsillar abscess.  Neck: Supple. No thyromegaly. No lymphadenopathy. Lungs: Clear bilaterally to auscultation without wheezes, rales, or rhonchi. Breathing is unlabored. Heart: Regular rhythm. No murmurs, rubs, or gallops. Msk:  Strength and tone normal for age. Extremities/Skin: Warm and dry. Bilateral forearms covered with scattered small pink papules. Neuro: Alert and oriented X 3. Moves all extremities spontaneously. Gait is normal. CNII-XII grossly in tact. Psych:  Responds to questions appropriately with a normal affect.     ASSESSMENT AND PLAN:  38 y.o. year old male with  1. Allergic dermatitis Discussed  using IM versus oral prednisone. Patient prefers to get an injection with more rapid onset. Give Depo-Medrol 80 mg now.  2. Allergic rhinitis - fluticasone (FLONASE) 50 MCG/ACT nasal spray; Place 2 sprays into both nostrils daily.  Dispense: 16 g; Refill: 6---  discussed that he is to use this on days that he is having problems with nasal                 congestion. - cetirizine (ZYRTEC) 10 MG tablet; Take 1 tablet (10 mg total) by mouth daily.  Dispense: 30 tablet; Refill: 11----discussed that he can use this on the days that he is having any sneezing or clear  rhinorrhea or itchy watery eyes.  [address], Mary Beth CongressDixon, [place]PA, [facility] IncBSFM 01/18/2014 3:49 PM

## 2014-01-30 ENCOUNTER — Telehealth: Payer: Self-pay | Admitting: Family Medicine

## 2014-01-30 NOTE — Telephone Encounter (Signed)
Per [facility] - He could have atarax 25 mg poqhs prn insomnia.

## 2014-01-30 NOTE — Telephone Encounter (Signed)
?   OK to Refill  

## 2014-01-30 NOTE — Telephone Encounter (Signed)
Would not mix xanax and clonopin, needs to try one or the other.

## 2014-01-30 NOTE — Telephone Encounter (Signed)
Call back number is [phone] Pt is calling because in the past Dr Tanya NonesPickard has wrote him a prescription for xanax because the pt is having trouble sleeping and he would like to have a refill

## 2014-01-31 NOTE — Telephone Encounter (Signed)
LMTRC

## 2014-02-01 MED ORDER — HYDROXYZINE HCL 25 MG PO TABS: 25.0000 mg | ORAL_TABLET | Freq: Every evening | ORAL | Status: DC | PRN

## 2014-02-01 NOTE — Telephone Encounter (Signed)
Med sent to pharm and pt aware 

## 2014-02-13 ENCOUNTER — Encounter: Payer: BC Managed Care – PPO | Admitting: Family Medicine

## 2014-03-02 ENCOUNTER — Encounter: Payer: BC Managed Care – PPO | Admitting: Family Medicine

## 2014-03-29 ENCOUNTER — Telehealth: Payer: Self-pay | Admitting: *Deleted

## 2014-03-29 NOTE — Telephone Encounter (Signed)
Received call from patient.   Reports that he has been taking Clonazepam for the past 6-7 months. States that over the last few weeks he has noticed that the medication is not as effective and he is starting to feel slightly depressed as well.   Requested MD to either increase dosage or to advise new medication.   MD to be made aware.

## 2014-03-29 NOTE — Telephone Encounter (Signed)
ntbs to discuss medication changes/etc.

## 2014-03-30 MED ORDER — CLONAZEPAM 0.5 MG PO TABS: ORAL_TABLET | ORAL | Status: DC

## 2014-03-30 NOTE — Telephone Encounter (Signed)
LMTRC

## 2014-03-30 NOTE — Telephone Encounter (Signed)
Call placed to patient and patient made aware.   Requested refill on current dose. MD made aware and approved.   Medication called to pharmacy.

## 2014-04-02 ENCOUNTER — Ambulatory Visit (INDEPENDENT_AMBULATORY_CARE_PROVIDER_SITE_OTHER): Payer: BC Managed Care – PPO | Admitting: Family Medicine

## 2014-04-02 ENCOUNTER — Encounter: Payer: Self-pay | Admitting: Family Medicine

## 2014-04-02 VITALS — BP 128/78 | HR 98 | Temp 97.2°F | Resp 22 | Ht 70.0 in | Wt 223.0 lb

## 2014-04-02 DIAGNOSIS — F429 Obsessive-compulsive disorder, unspecified: Secondary | ICD-10-CM | POA: Insufficient documentation

## 2014-04-02 DIAGNOSIS — F411 Generalized anxiety disorder: Secondary | ICD-10-CM

## 2014-04-02 HISTORY — DX: Obsessive-compulsive disorder, unspecified: F42.9

## 2014-04-02 NOTE — Progress Notes (Signed)
Subjective:    Patient ID: Robert Mcpherson, male    DOB: 15-Sep-1976, 38 y.o.   MRN: [id number]  HPI October 2014 Patient has a history of generalized anxiety disorder with occasional panic attacks. In the past he tried Effexor which caused sexual side effects and he discontinued the medication. He is also tried Lexapro. However this medicine caused him to feel "numb" and he stopped the medication. Since stopping the Lexapro, his anxiety has worsened. He is now having to use the Xanax twice a day to function. He is here today to discuss options. He also says he feels a lot of his anxiety is due to his inability to sleep. He sleeps sometimes 10 hours a night, but when he wakes, he feels just as tired like he hasn't slept. He has a family history of obstructive sleep apnea. He denies any restless leg syndrome symptoms. His wife is a heavy sleeper and he says she has never complained of him snoring, breathing awkwardly, or moving in his sleep.  At that time, my plan was: 1. GAD (generalized anxiety disorder) Due to the risk of habituation, we will discontinue Xanax and replace with Klonopin 0.5 mg by mouth twice a day. I am hoping that a longer half-life will provide better coverage of his anxiety. Also begin a workup for problem #2. Pending the results of his sleep study, we may consider trying the patient on Wellbutrin. - clonazePAM (KLONOPIN) 0.5 MG tablet; Take 1 tablet (0.5 mg total) by mouth 2 (two) times daily as needed for anxiety.  Dispense: 60 tablet; Refill: 1  2. Hypersomnolence - Ambulatory referral to Sleep Studies  04/02/14 Patient has been seeing a psychologist who also feels he suffers from obsessive-compulsive disorder. Patient has been speaking with a psychologist who has recommended Prozac or Zoloft to help treat his ICD as well as generalized anxiety disorder to act as medical negative rather than the symptomatic relief provided by Klonopin.  However patient had severe sexual side effects  on both Lexapro and Effexor.  He is here today to discuss other methods to control generalized anxiety disorder as well as OCD. Past Medical History  Diagnosis Date  . Deviated nasal septum 09/2013  . Nasal turbinate hypertrophy 09/2013  . Dental crown present   . Anxiety   . OCD (obsessive compulsive disorder) 04/02/2014   Current Outpatient Prescriptions on File Prior to Visit  Medication Sig Dispense Refill  . cetirizine (ZYRTEC) 10 MG tablet Take 1 tablet (10 mg total) by mouth daily.  30 tablet  11  . clonazePAM (KLONOPIN) 0.5 MG tablet TAKE 1 TABLET BY MOUTH TWICE DAILY AS NEEDED FOR ANXIETY  60 tablet  0  . fluticasone (FLONASE) 50 MCG/ACT nasal spray Place 2 sprays into both nostrils daily.  16 g  6  . hydrOXYzine (ATARAX/VISTARIL) 25 MG tablet Take 1 tablet (25 mg total) by mouth at bedtime as needed.  30 tablet  3   No current facility-administered medications on file prior to visit.   Allergies  Allergen Reactions  . Cephalexin Hives   History   Social History  . Marital Status: Married    Spouse Name: N/A    Number of Children: N/A  . Years of Education: N/A   Occupational History  . Not on file.   Social History Main Topics  . Smoking status: Never Smoker   . Smokeless tobacco: Never Used  . Alcohol Use: No  . Drug Use: No  . Sexual Activity: Not on  file   Other Topics Concern  . Not on file   Social History Narrative  . No narrative on file      Review of Systems  All other systems reviewed and are negative.      Objective:   Physical Exam  Vitals reviewed. Cardiovascular: Normal rate and regular rhythm.   Pulmonary/Chest: Effort normal and breath sounds normal.  Abdominal: Soft. Bowel sounds are normal.  Psychiatric: He has a normal mood and affect. His behavior is normal. Judgment and thought content normal.          Assessment & Plan:   OCD (obsessive compulsive disorder)  GAD (generalized anxiety disorder)  Although it is not  indicated for OCD, I believe Brintellix provides an excellent option with minimal risk of erectile dysfunction. I believe this medicine will help not only with his anxiety but also contains OCD tendencies given its focus on the neurotransmitter serotonin while being selective to avoid side effects.  Begin Brintellix 5 mg poqday and increase to 10 mg poqday after 1 week.  Recheck in 1 month.  Wean the patient off klonopin slowly as tolerated in the future if Brintellix is effective.  Consider zoloft for cost if brintellix fails.

## 2014-05-03 ENCOUNTER — Telehealth: Payer: Self-pay | Admitting: Family Medicine

## 2014-05-03 MED ORDER — VORTIOXETINE HBR 10 MG PO TABS: 10.0000 mg | ORAL_TABLET | Freq: Every day | ORAL | Status: DC

## 2014-05-03 MED ORDER — CLONAZEPAM 0.5 MG PO TABS: ORAL_TABLET | ORAL | Status: DC

## 2014-05-03 NOTE — Telephone Encounter (Signed)
Med refilled for 30 days pt is going to make appt in 30 days for follow up

## 2014-05-03 NOTE — Telephone Encounter (Signed)
ok 

## 2014-05-03 NOTE — Telephone Encounter (Signed)
[phone] Patient is calling to get a rx for britellix, he said we gave him samples of this, and also clonazepam    Pharmacy [facility] [place]

## 2014-05-03 NOTE — Telephone Encounter (Signed)
?   OK to Refill  

## 2014-06-06 ENCOUNTER — Other Ambulatory Visit: Payer: Self-pay | Admitting: Physician Assistant

## 2014-06-07 NOTE — Telephone Encounter (Signed)
ok 

## 2014-06-07 NOTE — Telephone Encounter (Signed)
Medication called to pharmacy. 

## 2014-06-07 NOTE — Telephone Encounter (Signed)
Ok to refill??  Last office visit 04/02/2014.  Last refill 05/03/2014.

## 2014-06-23 ENCOUNTER — Telehealth: Payer: Self-pay | Admitting: *Deleted

## 2014-06-23 NOTE — Telephone Encounter (Signed)
Received PA determination.   PA approved.  

## 2014-06-23 NOTE — Telephone Encounter (Signed)
Received fax requesting PA for Brintellix.   PA submitted.

## 2014-07-07 ENCOUNTER — Other Ambulatory Visit: Payer: Self-pay | Admitting: *Deleted

## 2014-07-07 MED ORDER — VORTIOXETINE HBR 10 MG PO TABS: 10.0000 mg | ORAL_TABLET | Freq: Every day | ORAL | Status: DC

## 2014-07-07 NOTE — Telephone Encounter (Signed)
Received fax requesting refill on Brintillex.   Refill appropriate and filled per protocol.

## 2014-07-12 ENCOUNTER — Ambulatory Visit (INDEPENDENT_AMBULATORY_CARE_PROVIDER_SITE_OTHER): Payer: BC Managed Care – PPO | Admitting: Family Medicine

## 2014-07-12 ENCOUNTER — Encounter: Payer: Self-pay | Admitting: Family Medicine

## 2014-07-12 VITALS — BP 128/74 | HR 78 | Temp 98.1°F | Resp 18 | Ht 69.5 in | Wt 227.0 lb

## 2014-07-12 DIAGNOSIS — F411 Generalized anxiety disorder: Secondary | ICD-10-CM

## 2014-07-12 MED ORDER — ALPRAZOLAM 0.5 MG PO TABS: 0.5000 mg | ORAL_TABLET | Freq: Two times a day (BID) | ORAL | Status: DC | PRN

## 2014-07-12 NOTE — Progress Notes (Signed)
Subjective:    Patient ID: Robert Mcpherson, male    DOB: 05/23/1976, 38 y.o.   MRN: [id number]  HPI Patient history generalized anxiety disorder as well as excessive compulsive disorder. Last saw the patient he was taking Klonopin 2-3 times a day for anxiety they will control. Will try him on SSRIs in the past the patient had discontinued medication due to sexual side effects. There last office visit, I started the patient on Brintellix 10 mg by mouth daily as a preventative/maintenance medicine for his generalized anxiety disorder as well as depression and OCD. Patient states the medicine is substantially helping him. He states the depression and anxiety as much better controlled than it was at his last visit. Medicine also seems to help calm some of his OCD tendencies. However he feels like he is going to need an/tolerant of Klonopin during his years of use. The medication does not seem to help his panic attacks when he takes the medication. He requires a medication usually once a day occasionally twice a day. He would like to switch back to Xanax as that medicine worked  better for him in the past. Past Medical History  Diagnosis Date  . Deviated nasal septum 09/2013  . Nasal turbinate hypertrophy 09/2013  . Dental crown present   . Anxiety   . OCD (obsessive compulsive disorder) 04/02/2014   Past Surgical History  Procedure Laterality Date  . Appendectomy    . Vasectomy  06/02/2013  . Nasal septoplasty w/ turbinoplasty Bilateral 10/02/2013    Procedure: BILATERAL NASAL SEPTOPLASTY WITH TURBINATE REDUCTION;  Surgeon: Serena Colonel, MD;  Location: [facility];  Service: ENT;  Laterality: Bilateral;  . Shoulder arthroscopy Right 11/2013   Current Outpatient Prescriptions on File Prior to Visit  Medication Sig Dispense Refill  . clonazePAM (KLONOPIN) 0.5 MG tablet take 1 tablet by mouth twice a day if needed for anxiety  60 tablet  0  . Vortioxetine HBr (BRINTELLIX) 10 MG TABS Take 1  tablet (10 mg total) by mouth daily. Take one tablet po q d  30 tablet  3  . cetirizine (ZYRTEC) 10 MG tablet Take 1 tablet (10 mg total) by mouth daily.  30 tablet  11  . fluticasone (FLONASE) 50 MCG/ACT nasal spray Place 2 sprays into both nostrils daily.  16 g  6  . hydrOXYzine (ATARAX/VISTARIL) 25 MG tablet Take 1 tablet (25 mg total) by mouth at bedtime as needed.  30 tablet  3   No current facility-administered medications on file prior to visit.   Allergies  Allergen Reactions  . Cephalexin Hives   History   Social History  . Marital Status: Married    Spouse Name: N/A    Number of Children: N/A  . Years of Education: N/A   Occupational History  . Not on file.   Social History Main Topics  . Smoking status: Never Smoker   . Smokeless tobacco: Never Used  . Alcohol Use: No  . Drug Use: No  . Sexual Activity: Not on file   Other Topics Concern  . Not on file   Social History Narrative  . No narrative on file      Review of Systems  All other systems reviewed and are negative.      Objective:   Physical Exam  Vitals reviewed. Cardiovascular: Normal rate, regular rhythm and normal heart sounds.   No murmur heard. Pulmonary/Chest: Effort normal and breath sounds normal. No respiratory distress. He has  no wheezes. He has no rales.  Abdominal: Soft. Bowel sounds are normal. He exhibits no distension. There is no tenderness. There is no rebound and no guarding.  Musculoskeletal: He exhibits no edema.          Assessment & Plan:  GAD (generalized anxiety disorder) - Plan: ALPRAZolam (XANAX) 0.5 MG tablet  John anxiety disorder seems much better controlled. I like to continue Brintellix 10 mg by mouth daily. I will discontinue Klonopin and start the patient on Xanax 0.5 mg by mouth every 12 hours when necessary anxiety. Given 60 tablets. This should last 45-60 days.  I refilled her medicine every 2 months. Also recommended complete physical exam including  fasting lab work and a flu shot. The patient declines a flu shot at the present time. He will return fasting for lab work.

## 2014-07-17 ENCOUNTER — Other Ambulatory Visit: Payer: BC Managed Care – PPO

## 2014-07-21 ENCOUNTER — Telehealth: Payer: Self-pay | Admitting: *Deleted

## 2014-07-21 NOTE — Telephone Encounter (Signed)
Received fax requesting refill on Clonazepam.   Ok to refill??  Last office visit 07/12/2014.  Last refill 06/07/2014.

## 2014-07-23 NOTE — Telephone Encounter (Signed)
Noted  

## 2014-07-23 NOTE — Telephone Encounter (Signed)
Denied, at last ov he was switched to xanax

## 2014-07-26 ENCOUNTER — Telehealth: Payer: Self-pay | Admitting: *Deleted

## 2014-07-26 NOTE — Telephone Encounter (Signed)
Received fax requesting refill on Clonazepam.   Ok to refill??  Last office visit 07/12/2014.  Last refill 06/07/2014. 

## 2014-07-27 NOTE — Telephone Encounter (Signed)
Noted  

## 2014-07-27 NOTE — Telephone Encounter (Signed)
Denied, he switched to xanax at last ov.  Ok to refill xanax if due.

## 2014-07-31 ENCOUNTER — Encounter: Payer: BC Managed Care – PPO | Admitting: Family Medicine

## 2014-08-03 ENCOUNTER — Telehealth: Payer: Self-pay | Admitting: Family Medicine

## 2014-08-03 NOTE — Telephone Encounter (Signed)
[phone] Patient is calling to get switched back to klonopin instead of the xanax he dosen't like the way he feels when he is taking it

## 2014-08-06 NOTE — Telephone Encounter (Signed)
Ok with d/c xanax and switch back to klonopin 0.5 bid (60/month).

## 2014-08-07 NOTE — Telephone Encounter (Signed)
Pt states that he was switched to the xanax b/c klonopin was no longer effective and since you are switching him back he would like to have it increased if possible?

## 2014-08-07 NOTE — Telephone Encounter (Signed)
No, this is habit forming.  I recommend a psychiatry evaluation if not working.

## 2014-08-08 NOTE — Telephone Encounter (Signed)
LMTRC

## 2014-08-09 MED ORDER — CLONAZEPAM 0.5 MG PO TABS: ORAL_TABLET | ORAL | Status: DC

## 2014-08-09 NOTE — Telephone Encounter (Signed)
Pt aware and med called to pharm 

## 2014-08-13 ENCOUNTER — Encounter: Payer: Self-pay | Admitting: Physician Assistant

## 2014-08-13 ENCOUNTER — Ambulatory Visit (INDEPENDENT_AMBULATORY_CARE_PROVIDER_SITE_OTHER): Payer: BC Managed Care – PPO | Admitting: Physician Assistant

## 2014-08-13 VITALS — BP 110/70 | HR 84 | Temp 98.3°F | Resp 20 | Wt 227.0 lb

## 2014-08-13 DIAGNOSIS — B9689 Other specified bacterial agents as the cause of diseases classified elsewhere: Principal | ICD-10-CM

## 2014-08-13 DIAGNOSIS — J988 Other specified respiratory disorders: Secondary | ICD-10-CM

## 2014-08-13 MED ORDER — AZITHROMYCIN 250 MG PO TABS: ORAL_TABLET | ORAL | Status: DC

## 2014-08-13 NOTE — Progress Notes (Signed)
    Patient ID: Robert Mcpherson MRN: [id number], DOB: February 22, 1976, 38 y.o. Date of Encounter: 08/13/2014, 4:36 PM    Chief Complaint:  Chief Complaint  Patient presents with  . sick x 1 week    sinus infection     HPI: 38 y.o. year old white male says that he has been sick for over a week. Says in the beginning his throat was sore but now his throat just feels a little irritated. Says in the beginning and was all in his head and nose. Says now it is also moved into his chest. However he still also has quite a bit in his head and nose as well and is still getting out a lot of thick dark mucus from his nose. Also coughing up thick dark phlegm. Has had no known fevers or chills.     Home Meds:   Outpatient Prescriptions Prior to Visit  Medication Sig Dispense Refill  . clonazePAM (KLONOPIN) 0.5 MG tablet take 1 tablet by mouth twice a day if needed for anxiety  60 tablet  2  . Vortioxetine HBr (BRINTELLIX) 10 MG TABS Take 1 tablet (10 mg total) by mouth daily. Take one tablet po q d  30 tablet  3  . cetirizine (ZYRTEC) 10 MG tablet Take 1 tablet (10 mg total) by mouth daily.  30 tablet  11  . fluticasone (FLONASE) 50 MCG/ACT nasal spray Place 2 sprays into both nostrils daily.  16 g  6  . hydrOXYzine (ATARAX/VISTARIL) 25 MG tablet Take 1 tablet (25 mg total) by mouth at bedtime as needed.  30 tablet  3   No facility-administered medications prior to visit.    Allergies:  Allergies  Allergen Reactions  . Cephalexin Hives      Review of Systems: See HPI for pertinent ROS. All other ROS negative.    Physical Exam: Blood pressure 110/70, pulse 84, temperature 98.3 F (36.8 C), temperature source Oral, resp. rate 20, weight 227 lb (102.967 kg)., Body mass index is 33.05 kg/(m^2). General: WNWD WM.  Appears in no acute distress. HEENT: Normocephalic, atraumatic, eyes without discharge, sclera non-icteric, nares are without discharge. Bilateral auditory canals clear, TM's are without  perforation, pearly grey and translucent with reflective cone of light bilaterally. Oral cavity moist, posterior pharynx without exudate, erythema, peritonsillar abscess, or post nasal drip.  Neck: Supple. No thyromegaly. No lymphadenopathy. Lungs: Clear bilaterally to auscultation without wheezes, rales, or rhonchi. Breathing is unlabored. Heart: Regular rhythm. No murmurs, rubs, or gallops. Msk:  Strength and tone normal for age. Extremities/Skin: Warm and dry. Neuro: Alert and oriented X 3. Moves all extremities spontaneously. Gait is normal. CNII-XII grossly in tact. Psych:  Responds to questions appropriately with a normal affect.     ASSESSMENT AND PLAN:  38 y.o. year old male with  1. Bacterial respiratory infection - azithromycin (ZITHROMAX) 250 MG tablet; Day 1: Take 2 daily. Days 2-5: Take 1 daily.  Dispense: 6 tablet; Refill: 0 Complete all of the antibiotic. Recommend use over-the-counter Mucinex DM or Robitussin-DM as expectorant. Also can use cough suppressant and decongestant as needed for symptom relief. Followup if symptoms do not resolve within one week after completion of antibiotic.  [id number]igned, Adarrius Graeff Beth Hoffman EstatesDixon, [place]PA, [facility] 08/13/2014 4:36 PM

## 2014-10-13 ENCOUNTER — Ambulatory Visit (INDEPENDENT_AMBULATORY_CARE_PROVIDER_SITE_OTHER): Payer: BC Managed Care – PPO

## 2014-10-13 ENCOUNTER — Ambulatory Visit (INDEPENDENT_AMBULATORY_CARE_PROVIDER_SITE_OTHER): Payer: BC Managed Care – PPO | Admitting: Family Medicine

## 2014-10-13 VITALS — BP 120/80 | HR 75 | Temp 98.0°F | Resp 20 | Ht 69.75 in | Wt 229.1 lb

## 2014-10-13 DIAGNOSIS — R0602 Shortness of breath: Secondary | ICD-10-CM

## 2014-10-13 DIAGNOSIS — J0141 Acute recurrent pansinusitis: Secondary | ICD-10-CM

## 2014-10-13 DIAGNOSIS — J029 Acute pharyngitis, unspecified: Secondary | ICD-10-CM

## 2014-10-13 LAB — POCT CBC
Granulocyte percent: 69.8 %G (ref 37–80)
HEMATOCRIT: 50.6 % (ref 43.5–53.7)
Hemoglobin: 17 g/dL (ref 14.1–18.1)
LYMPH, POC: 2.7 (ref 0.6–3.4)
MCH, POC: 31.1 pg (ref 27–31.2)
MCHC: 33.7 g/dL (ref 31.8–35.4)
MCV: 92.3 fL (ref 80–97)
MID (cbc): 0.6 (ref 0–0.9)
MPV: 7.3 fL (ref 0–99.8)
POC Granulocyte: 7.7 — AB (ref 2–6.9)
POC LYMPH PERCENT: 24.8 %L (ref 10–50)
POC MID %: 5.4 %M (ref 0–12)
Platelet Count, POC: 238 10*3/uL (ref 142–424)
RBC: 5.48 M/uL (ref 4.69–6.13)
RDW, POC: 13.8 %
WBC: 11 10*3/uL — AB (ref 4.6–10.2)

## 2014-10-13 LAB — POCT RAPID STREP A (OFFICE): RAPID STREP A SCREEN: NEGATIVE

## 2014-10-13 MED ORDER — AMOXICILLIN-POT CLAVULANATE 875-125 MG PO TABS: 1.0000 | ORAL_TABLET | Freq: Two times a day (BID) | ORAL | Status: DC

## 2014-10-13 MED ORDER — HYDROCOD POLST-CHLORPHEN POLST 10-8 MG/5ML PO LQCR: 5.0000 mL | Freq: Two times a day (BID) | ORAL | Status: DC | PRN

## 2014-10-13 MED ORDER — ALBUTEROL SULFATE (2.5 MG/3ML) 0.083% IN NEBU
2.5000 mg | INHALATION_SOLUTION | Freq: Once | RESPIRATORY_TRACT | Status: AC
  Administered 2014-10-13: 2.5 mg via RESPIRATORY_TRACT

## 2014-10-13 MED ORDER — METHYLPREDNISOLONE ACETATE 80 MG/ML IJ SUSP
120.0000 mg | Freq: Once | INTRAMUSCULAR | Status: AC
  Administered 2014-10-13: 120 mg via INTRAMUSCULAR

## 2014-10-13 NOTE — Progress Notes (Addendum)
Subjective:    Patient ID: Robert Mcpherson, male    DOB: 21-Jun-1976, 38 y.o.   MRN: [id number] This chart was scribed for Robert SorensonEva Demarri Elie, MD by Littie Deedsichard Sun, Medical Scribe. This patient was seen in Room 2 and the patient's care was started at 1:55 PM.  Chief Complaint  Patient presents with  . Sore Throat  . Cough    with Green Phelgm  . Nasal Congestion  . Ears Feel Full    HPI HPI Comments: Robert Mcpherson is a 38 y.o. male who presents to the [facility] complaining of gradual onset, gradually worsening URI symptoms that began 6 days ago. His symptoms started with a sore throat, then he developed chills, nasal congestion, rhinorrhea, occasional dizziness, productive cough without blood, mild SOB, sinus pressure and ears feeling full. He has had some difficulty sleeping due to symptoms. Patient states his mother experienced similar symptoms and was the first in his family to have these symptoms. He has tried ibuprofen for his symptoms. Patient denies fever and wheezing. He also denies hx of smoking and asthma.  Patient had a nasal septoplasty with turbinoplasty done last year. He does not think the surgery worked very well because he had a sinus infection 3 months ago in September.  Patient works as a Administratorlandscaper.  Past Medical History  Diagnosis Date  . Deviated nasal septum 09/2013  . Nasal turbinate hypertrophy 09/2013  . Dental crown present   . Anxiety   . OCD (obsessive compulsive disorder) 04/02/2014   Current Outpatient Prescriptions on File Prior to Visit  Medication Sig Dispense Refill  . clonazePAM (KLONOPIN) 0.5 MG tablet take 1 tablet by mouth twice a day if needed for anxiety 60 tablet 2  . Vortioxetine HBr (BRINTELLIX) 10 MG TABS Take 1 tablet (10 mg total) by mouth daily. Take one tablet po q d 30 tablet 3   No current facility-administered medications on file prior to visit.   Allergies  Allergen Reactions  . Cephalexin Hives    Past Surgical History    Procedure Laterality Date  . Appendectomy    . Vasectomy  06/02/2013  . Nasal septoplasty w/ turbinoplasty Bilateral 10/02/2013    Procedure: BILATERAL NASAL SEPTOPLASTY WITH TURBINATE REDUCTION;  Surgeon: Serena ColonelJefry Rosen, MD;  Location: [facility];  Service: ENT;  Laterality: Bilateral;  . Shoulder arthroscopy Right 11/2013     Review of Systems  Constitutional: Positive for chills. Negative for fever.  HENT: Positive for congestion, ear pain, rhinorrhea and sinus pressure.   Respiratory: Positive for cough and shortness of breath. Negative for wheezing.   Neurological: Positive for dizziness.  Psychiatric/Behavioral: Positive for sleep disturbance.       Objective:  BP 120/80 mmHg  Pulse 75  Temp(Src) 98 F (36.7 C) (Oral)  Resp 20  Ht 5' 9.75" (1.772 m)  Wt 229 lb 2 oz (103.93 kg)  BMI 33.10 kg/m2  SpO2 96%  Physical Exam  Constitutional: He is oriented to person, place, and time. He appears well-developed and well-nourished. No distress.  HENT:  Head: Normocephalic and atraumatic.  Right Ear: Tympanic membrane is erythematous. A middle ear effusion is present.  Left Ear: Tympanic membrane is erythematous. A middle ear effusion is present.  Nose: Rhinorrhea and septal deviation present.  Mouth/Throat: Posterior oropharyngeal erythema present. No oropharyngeal exudate.  Fluid behind ears bilaterally. Large amounts of green purulent mucous discharge from nose with septum deviated towards left. Copious amounts of purulent congestion seen in  oropharynx, erythema with 3+ tonsils, but no exudate.  Eyes: Pupils are equal, round, and reactive to light.  Neck: Neck supple. No thyromegaly present.  Thyroid normal.  Cardiovascular: Normal rate, regular rhythm and normal heart sounds.   No murmur heard. Pulmonary/Chest: Effort normal. He has decreased breath sounds. He has rhonchi.  Coarse breath sounds with expiratory rhonchi worse on right, worse in upper lobes.  Decreased breath sounds on left.   Musculoskeletal: He exhibits no edema.  Lymphadenopathy:       Head (right side): Tonsillar adenopathy present.       Head (left side): Tonsillar adenopathy present.    He has cervical adenopathy.       Right: No supraclavicular adenopathy present.       Left: No supraclavicular adenopathy present.  Neurological: He is alert and oriented to person, place, and time. No cranial nerve deficit.  Skin: Skin is warm and dry. No rash noted.  Psychiatric: He has a normal mood and affect. His behavior is normal.  Nursing note and vitals reviewed.  Results for orders placed or performed in visit on 10/13/14  POCT CBC  Result Value Ref Range   WBC 11.0 (A) 4.6 - 10.2 K/uL   Lymph, poc 2.7 0.6 - 3.4   POC LYMPH PERCENT 24.8 10 - 50 %L   MID (cbc) 0.6 0 - 0.9   POC MID % 5.4 0 - 12 %M   POC Granulocyte 7.7 (A) 2 - 6.9   Granulocyte percent 69.8 37 - 80 %G   RBC 5.48 4.69 - 6.13 M/uL   Hemoglobin 17.0 14.1 - 18.1 g/dL   HCT, POC 45.450.6 09.843.5 - 53.7 %   MCV 92.3 80 - 97 fL   MCH, POC 31.1 27 - 31.2 pg   MCHC 33.7 31.8 - 35.4 g/dL   RDW, POC 11.913.8 %   Platelet Count, POC 238 142 - 424 K/uL   MPV 7.3 0 - 99.8 fL  POCT rapid strep A  Result Value Ref Range   Rapid Strep A Screen Negative Negative   [facility] reading (PRIMARY) by  Dr. Clelia CroftShaw. CXR: no acute abnormality     Assessment & Plan:  Acute recurrent pansinusitis - Plan: methylPREDNISolone acetate (DEPO-MEDROL) injection 120 mg, Culture, Group A Strep  Shortness of breath - Plan: POCT CBC, DG Chest 2 View, albuterol (PROVENTIL) (2.5 MG/3ML) 0.083% nebulizer solution 2.5 mg - try albuterol prior to exercise  Acute pharyngitis, unspecified pharyngitis type - Plan: POCT rapid strep A, methylPREDNISolone acetate (DEPO-MEDROL) injection 120 mg  Meds ordered this encounter  Medications  . albuterol (PROVENTIL) (2.5 MG/3ML) 0.083% nebulizer solution 2.5 mg    Sig:   . methylPREDNISolone acetate (DEPO-MEDROL)  injection 120 mg    Sig:   . amoxicillin-clavulanate (AUGMENTIN) 875-125 MG per tablet    Sig: Take 1 tablet by mouth 2 (two) times daily.    Dispense:  20 tablet    Refill:  0  . chlorpheniramine-HYDROcodone (TUSSIONEX PENNKINETIC ER) 10-8 MG/5ML LQCR    Sig: Take 5 mLs by mouth every 12 (twelve) hours as needed.    Dispense:  90 mL    Refill:  0    I personally performed the services described in this documentation, which was scribed in my presence. The recorded information has been reviewed and considered, and addended by me as needed.  Robert SorensonEva Cordie Beazley, MD MPH

## 2014-10-13 NOTE — Patient Instructions (Signed)
Bronchospasm A bronchospasm is a spasm or tightening of the airways going into the lungs. During a bronchospasm breathing becomes more difficult because the airways get smaller. When this happens there can be coughing, a whistling sound when breathing (wheezing), and difficulty breathing. Bronchospasm is often associated with asthma, but not all patients who experience a bronchospasm have asthma. CAUSES  A bronchospasm is caused by inflammation or irritation of the airways. The inflammation or irritation may be triggered by:   Allergies (such as to animals, pollen, food, or mold). Allergens that cause bronchospasm may cause wheezing immediately after exposure or many hours later.   Infection. Viral infections are believed to be the most common cause of bronchospasm.   Exercise.   Irritants (such as pollution, cigarette smoke, strong odors, aerosol sprays, and paint fumes).   Weather changes. Winds increase molds and pollens in the air. Rain refreshes the air by washing irritants out. Cold air may cause inflammation.   Stress and emotional upset.  SIGNS AND SYMPTOMS   Wheezing.   Excessive nighttime coughing.   Frequent or severe coughing with a simple cold.   Chest tightness.   Shortness of breath.  DIAGNOSIS  Bronchospasm is usually diagnosed through a history and physical exam. Tests, such as chest X-rays, are sometimes done to look for other conditions. TREATMENT   Inhaled medicines can be given to open up your airways and help you breathe. The medicines can be given using either an inhaler or a nebulizer machine.  Corticosteroid medicines may be given for severe bronchospasm, usually when it is associated with asthma. HOME CARE INSTRUCTIONS   Always have a plan prepared for seeking medical care. Know when to call your health care provider and local emergency services (911 in the [place] Know where you can access local emergency care.  Only take medicines as  directed by your health care provider.  If you were prescribed an inhaler or nebulizer machine, ask your health care provider to explain how to use it correctly. Always use a spacer with your inhaler if you were given one.  It is necessary to remain calm during an attack. Try to relax and breathe more slowly.  Control your home environment in the following ways:   Change your heating and air conditioning filter at least once a month.   Limit your use of fireplaces and wood stoves.  Do not smoke and do not allow smoking in your home.   Avoid exposure to perfumes and fragrances.   Get rid of pests (such as roaches and mice) and their droppings.   Throw away plants if you see mold on them.   Keep your house clean and dust free.   Replace carpet with wood, tile, or vinyl flooring. Carpet can trap dander and dust.   Use allergy-proof pillows, mattress covers, and box spring covers.   Wash bed sheets and blankets every week in hot water and dry them in a dryer.   Use blankets that are made of polyester or cotton.   Wash hands frequently. SEEK MEDICAL CARE IF:   You have muscle aches.   You have chest pain.   The sputum changes from clear or white to yellow, green, gray, or bloody.   The sputum you cough up gets thicker.   There are problems that may be related to the medicine you are given, such as a rash, itching, swelling, or trouble breathing.  SEEK IMMEDIATE MEDICAL CARE IF:   You have worsening wheezing and coughing even   after taking your prescribed medicines.   You have increased difficulty breathing.   You develop severe chest pain. MAKE SURE YOU:   Understand these instructions.  Will watch your condition.  Will get help right away if you are not doing well or get worse. Document Released: 10/22/2003 Document Revised: 10/24/2013 Document Reviewed: 04/10/2013 [facility] Patient Information 2015 [employer], [place]. This information is not  intended to replace advice given to you by your health care provider. Make sure you discuss any questions you have with your health care provider.  Sinusitis Sinusitis is redness, soreness, and inflammation of the paranasal sinuses. Paranasal sinuses are air pockets within the bones of your face (beneath the eyes, the middle of the forehead, or above the eyes). In healthy paranasal sinuses, mucus is able to drain out, and air is able to circulate through them by way of your nose. However, when your paranasal sinuses are inflamed, mucus and air can become trapped. This can allow bacteria and other germs to grow and cause infection. Sinusitis can develop quickly and last only a short time (acute) or continue over a long period (chronic). Sinusitis that lasts for more than 12 weeks is considered chronic.  CAUSES  Causes of sinusitis include:  Allergies.  Structural abnormalities, such as displacement of the cartilage that separates your nostrils (deviated septum), which can decrease the air flow through your nose and sinuses and affect sinus drainage.  Functional abnormalities, such as when the small hairs (cilia) that line your sinuses and help remove mucus do not work properly or are not present. SIGNS AND SYMPTOMS  Symptoms of acute and chronic sinusitis are the same. The primary symptoms are pain and pressure around the affected sinuses. Other symptoms include:  Upper toothache.  Earache.  Headache.  Bad breath.  Decreased sense of smell and taste.  A cough, which worsens when you are lying flat.  Fatigue.  Fever.  Thick drainage from your nose, which often is green and may contain pus (purulent).  Swelling and warmth over the affected sinuses. DIAGNOSIS  Your health care provider will perform a physical exam. During the exam, your health care provider may:  Look in your nose for signs of abnormal growths in your nostrils (nasal polyps).  Tap over the affected sinus to check  for signs of infection.  View the inside of your sinuses (endoscopy) using an imaging device that has a light attached (endoscope). If your health care provider suspects that you have chronic sinusitis, one or more of the following tests may be recommended:  Allergy tests.  Nasal culture. A sample of mucus is taken from your nose, sent to a lab, and screened for bacteria.  Nasal cytology. A sample of mucus is taken from your nose and examined by your health care provider to determine if your sinusitis is related to an allergy. TREATMENT  Most cases of acute sinusitis are related to a viral infection and will resolve on their own within 10 days. Sometimes medicines are prescribed to help relieve symptoms (pain medicine, decongestants, nasal steroid sprays, or saline sprays).  However, for sinusitis related to a bacterial infection, your health care provider will prescribe antibiotic medicines. These are medicines that will help kill the bacteria causing the infection.  Rarely, sinusitis is caused by a fungal infection. In theses cases, your health care provider will prescribe antifungal medicine. For some cases of chronic sinusitis, surgery is needed. Generally, these are cases in which sinusitis recurs more than 3 times per year, despite  other treatments. HOME CARE INSTRUCTIONS   Drink plenty of water. Water helps thin the mucus so your sinuses can drain more easily.  Use a humidifier.  Inhale steam 3 to 4 times a day (for example, sit in the bathroom with the shower running).  Apply a warm, moist washcloth to your face 3 to 4 times a day, or as directed by your health care provider.  Use saline nasal sprays to help moisten and clean your sinuses.  Take medicines only as directed by your health care provider.  If you were prescribed either an antibiotic or antifungal medicine, finish it all even if you start to feel better. SEEK IMMEDIATE MEDICAL CARE IF:  You have increasing pain or  severe headaches.  You have nausea, vomiting, or drowsiness.  You have swelling around your face.  You have vision problems.  You have a stiff neck.  You have difficulty breathing. MAKE SURE YOU:   Understand these instructions.  Will watch your condition.  Will get help right away if you are not doing well or get worse. Document Released: 10/19/2005 Document Revised: 03/05/2014 Document Reviewed: 11/03/2011 [facility] Patient Information 2015 [place], [place]. This information is not intended to replace advice given to you by your health care provider. Make sure you discuss any questions you have with your health care provider.

## 2014-10-15 LAB — CULTURE, GROUP A STREP: Organism ID, Bacteria: NORMAL

## 2014-11-09 ENCOUNTER — Telehealth: Payer: Self-pay | Admitting: Family Medicine

## 2014-11-09 NOTE — Telephone Encounter (Signed)
Requesting a refill on clonazepam - ? OK to Refill

## 2014-11-12 MED ORDER — CLONAZEPAM 0.5 MG PO TABS: ORAL_TABLET | ORAL | Status: DC

## 2014-11-12 NOTE — Telephone Encounter (Signed)
Requested med call to pharm

## 2014-11-12 NOTE — Telephone Encounter (Signed)
ok 

## 2014-12-26 ENCOUNTER — Encounter (HOSPITAL_COMMUNITY): Payer: Self-pay | Admitting: *Deleted

## 2014-12-26 DIAGNOSIS — R112 Nausea with vomiting, unspecified: Secondary | ICD-10-CM | POA: Insufficient documentation

## 2014-12-26 DIAGNOSIS — R197 Diarrhea, unspecified: Secondary | ICD-10-CM | POA: Diagnosis not present

## 2014-12-26 DIAGNOSIS — Z79899 Other long term (current) drug therapy: Secondary | ICD-10-CM | POA: Insufficient documentation

## 2014-12-26 DIAGNOSIS — R109 Unspecified abdominal pain: Secondary | ICD-10-CM | POA: Insufficient documentation

## 2014-12-26 DIAGNOSIS — F419 Anxiety disorder, unspecified: Secondary | ICD-10-CM | POA: Insufficient documentation

## 2014-12-26 DIAGNOSIS — Z98811 Dental restoration status: Secondary | ICD-10-CM | POA: Insufficient documentation

## 2014-12-26 DIAGNOSIS — Z9049 Acquired absence of other specified parts of digestive tract: Secondary | ICD-10-CM | POA: Diagnosis not present

## 2014-12-26 DIAGNOSIS — Z8709 Personal history of other diseases of the respiratory system: Secondary | ICD-10-CM | POA: Insufficient documentation

## 2014-12-26 LAB — CBC WITH DIFFERENTIAL/PLATELET
BASOS PCT: 0 % (ref 0–1)
Basophils Absolute: 0 10*3/uL (ref 0.0–0.1)
Eosinophils Absolute: 0.1 10*3/uL (ref 0.0–0.7)
Eosinophils Relative: 0 % (ref 0–5)
HCT: 51.9 % (ref 39.0–52.0)
Hemoglobin: 18.3 g/dL — ABNORMAL HIGH (ref 13.0–17.0)
Lymphocytes Relative: 4 % — ABNORMAL LOW (ref 12–46)
Lymphs Abs: 0.5 10*3/uL — ABNORMAL LOW (ref 0.7–4.0)
MCH: 31.6 pg (ref 26.0–34.0)
MCHC: 35.3 g/dL (ref 30.0–36.0)
MCV: 89.6 fL (ref 78.0–100.0)
MONOS PCT: 6 % (ref 3–12)
Monocytes Absolute: 0.8 10*3/uL (ref 0.1–1.0)
NEUTROS PCT: 90 % — AB (ref 43–77)
Neutro Abs: 11.6 10*3/uL — ABNORMAL HIGH (ref 1.7–7.7)
Platelets: 208 10*3/uL (ref 150–400)
RBC: 5.79 MIL/uL (ref 4.22–5.81)
RDW: 13.1 % (ref 11.5–15.5)
WBC: 12.9 10*3/uL — ABNORMAL HIGH (ref 4.0–10.5)

## 2014-12-26 MED ORDER — SODIUM CHLORIDE 0.9 % IV SOLN
Freq: Once | INTRAVENOUS | Status: AC
  Administered 2014-12-26: 23:00:00 via INTRAVENOUS

## 2014-12-26 MED ORDER — ONDANSETRON HCL 4 MG/2ML IJ SOLN
4.0000 mg | Freq: Once | INTRAMUSCULAR | Status: AC
  Administered 2014-12-26: 4 mg via INTRAVENOUS

## 2014-12-26 MED ORDER — ONDANSETRON HCL 4 MG/2ML IJ SOLN
INTRAMUSCULAR | Status: AC
  Filled 2014-12-26: qty 2

## 2014-12-26 NOTE — ED Notes (Addendum)
Patient presents stating he started with N/V/D about 330pm  Vomited frequently and yellowish diarrhea fequently

## 2014-12-26 NOTE — ED Notes (Signed)
Pt escorted to restroom.

## 2014-12-27 ENCOUNTER — Telehealth: Payer: Self-pay | Admitting: Family Medicine

## 2014-12-27 ENCOUNTER — Emergency Department (HOSPITAL_COMMUNITY)
Admission: EM | Admit: 2014-12-27 | Discharge: 2014-12-27 | Disposition: A | Discharge status: Home / Self Care | Payer: BLUE CROSS/BLUE SHIELD | Attending: Emergency Medicine | Admitting: Emergency Medicine

## 2014-12-27 DIAGNOSIS — R197 Diarrhea, unspecified: Secondary | ICD-10-CM

## 2014-12-27 DIAGNOSIS — R112 Nausea with vomiting, unspecified: Secondary | ICD-10-CM

## 2014-12-27 LAB — BASIC METABOLIC PANEL
ANION GAP: 12 (ref 5–15)
BUN: 23 mg/dL (ref 6–23)
CO2: 18 mmol/L — AB (ref 19–32)
Calcium: 9.7 mg/dL (ref 8.4–10.5)
Chloride: 109 mmol/L (ref 96–112)
Creatinine, Ser: 1.03 mg/dL (ref 0.50–1.35)
GFR calc non Af Amer: >90 mL/min (ref >90)
Glucose, Bld: 113 mg/dL — ABNORMAL HIGH (ref 70–99)
Potassium: 4.4 mmol/L (ref 3.5–5.1)
SODIUM: 139 mmol/L (ref 135–145)

## 2014-12-27 LAB — URINALYSIS, ROUTINE W REFLEX MICROSCOPIC
GLUCOSE, UA: NEGATIVE mg/dL
HGB URINE DIPSTICK: NEGATIVE
KETONES UR: 15 mg/dL — AB
Leukocytes, UA: NEGATIVE
Nitrite: NEGATIVE
Protein, ur: NEGATIVE mg/dL
Specific Gravity, Urine: 1.037 — ABNORMAL HIGH (ref 1.005–1.030)
Urobilinogen, UA: 0.2 mg/dL (ref 0.0–1.0)
pH: 5 (ref 5.0–8.0)

## 2014-12-27 MED ORDER — KETOROLAC TROMETHAMINE 30 MG/ML IJ SOLN
30.0000 mg | Freq: Once | INTRAMUSCULAR | Status: AC
  Administered 2014-12-27: 30 mg via INTRAVENOUS
  Filled 2014-12-27: qty 1

## 2014-12-27 MED ORDER — SODIUM CHLORIDE 0.9 % IV BOLUS (SEPSIS)
1000.0000 mL | Freq: Once | INTRAVENOUS | Status: AC
  Administered 2014-12-27: 1000 mL via INTRAVENOUS

## 2014-12-27 MED ORDER — DICYCLOMINE HCL 10 MG PO CAPS
10.0000 mg | ORAL_CAPSULE | Freq: Once | ORAL | Status: AC
  Administered 2014-12-27: 10 mg via ORAL
  Filled 2014-12-27: qty 1

## 2014-12-27 MED ORDER — MORPHINE SULFATE 4 MG/ML IJ SOLN
6.0000 mg | Freq: Once | INTRAMUSCULAR | Status: AC
  Administered 2014-12-27: 6 mg via INTRAVENOUS
  Filled 2014-12-27: qty 2

## 2014-12-27 MED ORDER — DICYCLOMINE HCL 20 MG PO TABS: 20.0000 mg | ORAL_TABLET | Freq: Three times a day (TID) | ORAL | Status: DC | PRN

## 2014-12-27 MED ORDER — ONDANSETRON 8 MG PO TBDP: 8.0000 mg | ORAL_TABLET | Freq: Three times a day (TID) | ORAL | Status: DC | PRN

## 2014-12-27 NOTE — Discharge Instructions (Signed)

## 2014-12-27 NOTE — Telephone Encounter (Signed)
[name] states that he is having some burning with urination. Informed her that the urinalysis that was done in the ER was neg for infection but did show some dehydration. Recommended that he make sure he is drinking fluids and if it gets worse to call our office back that he will need to be seen. [name] verbalized understanding.

## 2014-12-27 NOTE — ED Notes (Signed)
Pt given Sprite to drink; maintaining PO fluids at this time

## 2014-12-27 NOTE — Telephone Encounter (Signed)
Patient was at [place] last night for a stomach bug, according to wife he is having some additional symptoms today that she would like to speak with you or dr pickard about  Please call debra at [phone]

## 2014-12-27 NOTE — ED Provider Notes (Signed)
CSN: [id number]     Arrival date & time 12/26/14  2226 History  This chart was scribed for Lyanne Co, MD by Tanda Rockers, ED Scribe. This patient was seen in room D35C/D35C and the patient's care was started at 12:39 AM.    Chief Complaint  Patient presents with  . Nausea  . Emesis  . Diarrhea   The history is provided by the patient. No language interpreter was used.    HPI Comments: Robert Mcpherson is a 39 y.o. male who presents to the Emergency Department complaining of nausea, vomiting, and diarrhea that began earlier today. Pt also complains of abdominal cramping and a bloated feeling. Admits to previous appendectomty. He denies fever or recent sick contact with similar symptoms.    Past Medical History  Diagnosis Date  . Deviated nasal septum 09/2013  . Nasal turbinate hypertrophy 09/2013  . Dental crown present   . Anxiety   . OCD (obsessive compulsive disorder) 04/02/2014   Past Surgical History  Procedure Laterality Date  . Appendectomy    . Vasectomy  06/02/2013  . Nasal septoplasty w/ turbinoplasty Bilateral 10/02/2013    Procedure: BILATERAL NASAL SEPTOPLASTY WITH TURBINATE REDUCTION;  Surgeon: Serena Colonel, MD;  Location: [facility];  Service: ENT;  Laterality: Bilateral;  . Shoulder arthroscopy Right 11/2013   Family History  Problem Relation Age of Onset  . Heart disease Maternal Uncle 52    heart attack  . Heart disease Maternal Uncle 61    CVA   History  Substance Use Topics  . Smoking status: Never Smoker   . Smokeless tobacco: Never Used  . Alcohol Use: No    Review of Systems    A complete 10 system review of systems was obtained and all systems are negative except as noted in the HPI and PMH.    Allergies  Cephalexin  Home Medications   Prior to Admission medications   Medication Sig Start Date End Date Taking? Authorizing Provider  amoxicillin-clavulanate (AUGMENTIN) 875-125 MG per tablet Take 1 tablet by mouth 2 (two) times  daily. 10/13/14   Sherren Mocha, MD  chlorpheniramine-HYDROcodone (TUSSIONEX PENNKINETIC ER) 10-8 MG/5ML LQCR Take 5 mLs by mouth every 12 (twelve) hours as needed. 10/13/14   Sherren Mocha, MD  clonazePAM (KLONOPIN) 0.5 MG tablet take 1 tablet by mouth twice a day if needed for anxiety 11/12/14   Donita Brooks, MD  Vortioxetine HBr (BRINTELLIX) 10 MG TABS Take 1 tablet (10 mg total) by mouth daily. Take one tablet po q d 07/07/14   Donita Brooks, MD   Triage Vitals: BP 135/84 mmHg  Pulse 141  Temp(Src) 99 F (37.2 C) (Oral)  Resp 22  Ht  (1.753 m)  Wt 220 lb (99.791 kg)  BMI 32.47 kg/m2  SpO2 95%  Physical Exam  Constitutional: He is oriented to person, place, and time. He appears well-developed and well-nourished.  HENT:  Head: Normocephalic and atraumatic.  Eyes: EOM are normal.  Neck: Normal range of motion.  Cardiovascular: Normal rate, regular rhythm, normal heart sounds and intact distal pulses.   Pulmonary/Chest: Effort normal and breath sounds normal. No respiratory distress.  Abdominal: Soft. He exhibits no distension. There is no tenderness.  Musculoskeletal: Normal range of motion.  Neurological: He is alert and oriented to person, place, and time.  Skin: Skin is warm and dry.  Psychiatric: He has a normal mood and affect. Judgment normal.  Nursing note and vitals reviewed.  ED Course  Procedures (including critical care time)  DIAGNOSTIC STUDIES: Oxygen Saturation is 95% on RA, normal by my interpretation.    COORDINATION OF CARE:   12:41 AM-Discussed treatment plan which includes pain and nausea medication with pt at bedside and pt agreed to plan.   Labs Review Labs Reviewed  CBC WITH DIFFERENTIAL/PLATELET - Abnormal; Notable for the following:    WBC 12.9 (*)    Hemoglobin 18.3 (*)    Neutrophils Relative % 90 (*)    Neutro Abs 11.6 (*)    Lymphocytes Relative 4 (*)    Lymphs Abs 0.5 (*)    All other components within normal limits  BASIC  METABOLIC PANEL - Abnormal; Notable for the following:    CO2 18 (*)    Glucose, Bld 113 (*)    All other components within normal limits  URINALYSIS, ROUTINE W REFLEX MICROSCOPIC    Imaging Review No results found.   EKG Interpretation   Date/Time:  Wednesday December 26 2014 22:38:04 EST Ventricular Rate:  132 PR Interval:  144 QRS Duration: 80 QT Interval:  300 QTC Calculation: 444 R Axis:   90 Text Interpretation:  Sinus tachycardia Rightward axis Borderline ECG No  old tracing to compare Confirmed by Katessa Attridge  MD, Caryn BeeKEVIN ([id number]) on 12/27/2014  2:23:46 AM      MDM   Final diagnoses:  Nausea vomiting and diarrhea    Suspect bowel gastroenteritis.  Patient feels much better after 2 L IV fluids.  Initial heart rate was 141.  Abdominal exam is benign.  Tolerating oral fluids.  I personally performed the services described in this documentation, which was scribed in my presence. The recorded information has been reviewed and is accurate.       Lyanne CoKevin M Trust Crago, MD 12/27/14 [phone]

## 2014-12-27 NOTE — ED Notes (Signed)
Pt. Refused wheelchair and left with all belongings 

## 2015-01-15 ENCOUNTER — Other Ambulatory Visit: Payer: Self-pay | Admitting: Family Medicine

## 2015-01-15 MED ORDER — VORTIOXETINE HBR 10 MG PO TABS: 10.0000 mg | ORAL_TABLET | Freq: Every day | ORAL | Status: DC

## 2015-01-15 NOTE — Telephone Encounter (Signed)
Med sent to pharm 

## 2015-03-05 ENCOUNTER — Telehealth: Payer: Self-pay | Admitting: Family Medicine

## 2015-03-05 NOTE — Telephone Encounter (Signed)
ok 

## 2015-03-05 NOTE — Telephone Encounter (Signed)
Requesting a refill on Clonazepam - ? OK to Refill

## 2015-03-06 MED ORDER — CLONAZEPAM 0.5 MG PO TABS: ORAL_TABLET | ORAL | Status: DC

## 2015-03-06 NOTE — Telephone Encounter (Signed)
Medication called/sent to requested pharmacy  

## 2015-03-14 ENCOUNTER — Encounter: Payer: Self-pay | Admitting: Family Medicine

## 2015-03-14 ENCOUNTER — Ambulatory Visit (INDEPENDENT_AMBULATORY_CARE_PROVIDER_SITE_OTHER): Payer: BLUE CROSS/BLUE SHIELD | Admitting: Family Medicine

## 2015-03-14 VITALS — BP 136/78 | HR 88 | Temp 98.4°F | Resp 20 | Ht 70.0 in | Wt 223.0 lb

## 2015-03-14 DIAGNOSIS — J0140 Acute pansinusitis, unspecified: Secondary | ICD-10-CM

## 2015-03-14 MED ORDER — CLONAZEPAM 0.5 MG PO TABS: ORAL_TABLET | ORAL | Status: DC

## 2015-03-14 MED ORDER — AMOXICILLIN-POT CLAVULANATE 875-125 MG PO TABS: 1.0000 | ORAL_TABLET | Freq: Two times a day (BID) | ORAL | Status: DC

## 2015-03-14 MED ORDER — PREDNISONE 20 MG PO TABS: ORAL_TABLET | ORAL | Status: DC

## 2015-03-14 NOTE — Progress Notes (Signed)
   Subjective:    Patient ID: Robert Mcpherson, male    DOB: 03-07-1976, 39 y.o.   MRN: [id number]  HPI Patient presents with greater than 1 week of head congestion, postnasal drip, rhinorrhea, sore scratchy throat, and cough productive of thick mucus. Patient is having subjective fevers. He denies any headache but he does have a lot of sinus pressure Past Medical History  Diagnosis Date  . Deviated nasal septum 09/2013  . Nasal turbinate hypertrophy 09/2013  . Dental crown present   . Anxiety   . OCD (obsessive compulsive disorder) 04/02/2014   Past Surgical History  Procedure Laterality Date  . Appendectomy    . Vasectomy  06/02/2013  . Nasal septoplasty w/ turbinoplasty Bilateral 10/02/2013    Procedure: BILATERAL NASAL SEPTOPLASTY WITH TURBINATE REDUCTION;  Surgeon: Serena ColonelJefry Rosen, MD;  Location: [facility];  Service: ENT;  Laterality: Bilateral;  . Shoulder arthroscopy Right 11/2013   Current Outpatient Prescriptions on File Prior to Visit  Medication Sig Dispense Refill  . clonazePAM (KLONOPIN) 0.5 MG tablet take 1 tablet by mouth twice a day if needed for anxiety 60 tablet 2  . Vortioxetine HBr (BRINTELLIX) 10 MG TABS Take 1 tablet (10 mg total) by mouth daily. Take one tablet po q d 30 tablet 5  . chlorpheniramine-HYDROcodone (TUSSIONEX PENNKINETIC ER) 10-8 MG/5ML LQCR Take 5 mLs by mouth every 12 (twelve) hours as needed. (Patient not taking: Reported on 03/14/2015) 90 mL 0   No current facility-administered medications on file prior to visit.   Allergies  Allergen Reactions  . Cephalexin Hives   History   Social History  . Marital Status: Married    Spouse Name: N/A  . Number of Children: N/A  . Years of Education: N/A   Occupational History  . Not on file.   Social History Main Topics  . Smoking status: Never Smoker   . Smokeless tobacco: Never Used  . Alcohol Use: No  . Drug Use: No  . Sexual Activity: Yes   Other Topics Concern  . Not on file    Social History Narrative      Review of Systems  All other systems reviewed and are negative.      Objective:   Physical Exam  Constitutional: He appears well-developed and well-nourished.  HENT:  Right Ear: Tympanic membrane, external ear and ear canal normal.  Left Ear: Tympanic membrane, external ear and ear canal normal.  Nose: Mucosal edema and rhinorrhea present.  Mouth/Throat: Oropharynx is clear and moist.  Neck: Neck supple.  Cardiovascular: Normal rate, regular rhythm and normal heart sounds.   No murmur heard. Pulmonary/Chest: Effort normal and breath sounds normal. No respiratory distress. He has no wheezes. He has no rales. He exhibits no tenderness.  Abdominal: Soft. Bowel sounds are normal. He exhibits no distension. There is no tenderness. There is no rebound and no guarding.  Vitals reviewed.         Assessment & Plan:  Acute pansinusitis, recurrence not specified - Plan: amoxicillin-clavulanate (AUGMENTIN) 875-125 MG per tablet, predniSONE (DELTASONE) 20 MG tablet  Begin Augmentin 875 mg by mouth twice a day for 10 days. Also gave the patient a prednisone taper pack to decrease some of the thick swelling in his nasal mucosa/turbinates. Also refilled his Klonopin to 3 months

## 2015-06-12 ENCOUNTER — Other Ambulatory Visit: Payer: Self-pay | Admitting: Family Medicine

## 2015-06-12 MED ORDER — CLONAZEPAM 0.5 MG PO TABS: ORAL_TABLET | ORAL | Status: DC

## 2015-06-12 NOTE — Telephone Encounter (Signed)
Medication refilled per protocol. 

## 2015-06-12 NOTE — Telephone Encounter (Signed)
ok 

## 2015-06-12 NOTE — Telephone Encounter (Signed)
LRF 03/14/15 #60 + 2   LOV 03/14/15  OK refill?

## 2015-07-02 ENCOUNTER — Encounter: Payer: Self-pay | Admitting: Family Medicine

## 2015-07-02 ENCOUNTER — Ambulatory Visit (INDEPENDENT_AMBULATORY_CARE_PROVIDER_SITE_OTHER): Payer: BLUE CROSS/BLUE SHIELD | Admitting: Family Medicine

## 2015-07-02 VITALS — BP 126/78 | HR 84 | Temp 98.4°F | Resp 18 | Ht 70.0 in | Wt 229.0 lb

## 2015-07-02 DIAGNOSIS — S034XXA Sprain of jaw, initial encounter: Secondary | ICD-10-CM

## 2015-07-02 DIAGNOSIS — S0340XA Sprain of jaw, unspecified side, initial encounter: Secondary | ICD-10-CM

## 2015-07-02 MED ORDER — DICLOFENAC SODIUM 75 MG PO TBEC: 75.0000 mg | DELAYED_RELEASE_TABLET | Freq: Two times a day (BID) | ORAL | Status: DC

## 2015-07-02 MED ORDER — CLONAZEPAM 0.5 MG PO TABS: ORAL_TABLET | ORAL | Status: DC

## 2015-07-02 MED ORDER — DIAZEPAM 10 MG PO TABS: 10.0000 mg | ORAL_TABLET | Freq: Three times a day (TID) | ORAL | Status: DC | PRN

## 2015-07-02 NOTE — Progress Notes (Signed)
   Subjective:    Patient ID: Robert Mcpherson, male    DOB: 1976/05/21, 39 y.o.   MRN: [id number]  HPI Patient presents with over a week of pain in his right temporomandibular joint. It began after he had oral surgery for implants done. The patient reports pain with opening his mouth wide. The pain seems deep inside his ear. However on examination today the tympanic membrane appears pearly-gray and healthy with no evidence of infection. Patient does have some pain with range of motion in the temporomandibular joint. Past Medical History  Diagnosis Date  . Deviated nasal septum 09/2013  . Nasal turbinate hypertrophy 09/2013  . Dental crown present   . Anxiety   . OCD (obsessive compulsive disorder) 04/02/2014   Past Surgical History  Procedure Laterality Date  . Appendectomy    . Vasectomy  06/02/2013  . Nasal septoplasty w/ turbinoplasty Bilateral 10/02/2013    Procedure: BILATERAL NASAL SEPTOPLASTY WITH TURBINATE REDUCTION;  Surgeon: Serena Colonel, MD;  Location: [facility];  Service: ENT;  Laterality: Bilateral;  . Shoulder arthroscopy Right 11/2013   Current Outpatient Prescriptions on File Prior to Visit  Medication Sig Dispense Refill  . Vortioxetine HBr (BRINTELLIX) 10 MG TABS Take 1 tablet (10 mg total) by mouth daily. Take one tablet po q d 30 tablet 5   No current facility-administered medications on file prior to visit.   Allergies  Allergen Reactions  . Cephalexin Hives   Social History   Social History  . Marital Status: Married    Spouse Name: N/A  . Number of Children: N/A  . Years of Education: N/A   Occupational History  . Not on file.   Social History Main Topics  . Smoking status: Never Smoker   . Smokeless tobacco: Never Used  . Alcohol Use: No  . Drug Use: No  . Sexual Activity: Yes   Other Topics Concern  . Not on file   Social History Narrative      Review of Systems  All other systems reviewed and are negative.      Objective:     Physical Exam  Constitutional: He appears well-developed and well-nourished.  HENT:  Right Ear: Tympanic membrane, external ear and ear canal normal.  Left Ear: Tympanic membrane, external ear and ear canal normal.  Nose: Nose normal.  Mouth/Throat: Oropharynx is clear and moist. No oropharyngeal exudate.  Eyes: Conjunctivae are normal.  Neck: Neck supple.  Cardiovascular: Normal rate, regular rhythm and normal heart sounds.   Lymphadenopathy:    He has no cervical adenopathy.  Vitals reviewed.         Assessment & Plan:  TMJ (sprain of temporomandibular joint), initial encounter - Plan: diazepam (VALIUM) 10 MG tablet, diclofenac (VOLTAREN) 75 MG EC tablet  Temporarily hold Klonopin. Used IM 10 mg by mouth daily at bedtime to relax the masseter muscle. Use diclofenac 75 mg by mouth twice a day for the next week or so to calm irritation in the temporomandibular joint. Once the patient stops these medications he can go back on a standard dose of Klonopin. I did refill the patient's Klonopin which he takes 1-2 times a day as needed for anxiety and OCD.

## 2015-07-25 ENCOUNTER — Ambulatory Visit (INDEPENDENT_AMBULATORY_CARE_PROVIDER_SITE_OTHER): Payer: BLUE CROSS/BLUE SHIELD | Admitting: Family Medicine

## 2015-07-25 ENCOUNTER — Encounter: Payer: Self-pay | Admitting: Family Medicine

## 2015-07-25 VITALS — BP 122/84 | HR 86 | Temp 98.4°F | Resp 18 | Ht 70.0 in | Wt 226.0 lb

## 2015-07-25 DIAGNOSIS — F411 Generalized anxiety disorder: Secondary | ICD-10-CM | POA: Diagnosis not present

## 2015-07-25 DIAGNOSIS — T148 Other injury of unspecified body region: Secondary | ICD-10-CM | POA: Diagnosis not present

## 2015-07-25 DIAGNOSIS — L910 Hypertrophic scar: Secondary | ICD-10-CM | POA: Diagnosis not present

## 2015-07-25 DIAGNOSIS — R202 Paresthesia of skin: Secondary | ICD-10-CM

## 2015-07-25 DIAGNOSIS — W57XXXA Bitten or stung by nonvenomous insect and other nonvenomous arthropods, initial encounter: Secondary | ICD-10-CM

## 2015-07-25 DIAGNOSIS — S034XXA Sprain of jaw, initial encounter: Secondary | ICD-10-CM

## 2015-07-25 DIAGNOSIS — S0340XA Sprain of jaw, unspecified side, initial encounter: Secondary | ICD-10-CM

## 2015-07-25 MED ORDER — DIAZEPAM 10 MG PO TABS: 10.0000 mg | ORAL_TABLET | Freq: Three times a day (TID) | ORAL | Status: DC | PRN

## 2015-07-25 NOTE — Progress Notes (Signed)
Subjective:    Patient ID: Robert Mcpherson, male    DOB: [birth year], 39 y.o.   MRN: [id number]  HPI Patient was bitten by tick just below his right scapula more than a year ago. There is still a 4-5 mm erythematous papule in that area which appears to be a keloid. He states that it itches constantly. He denies any joint pains fever or neuropathy. He is more bothered by the persistence of the nodule as well as the neuropathic itching. On examination of his back there is a 4 mm erythematous papular nodule consistent with a keloid. There is no rash to explain the itching Past Medical History  Diagnosis Date  . Deviated nasal septum 09/2013  . Nasal turbinate hypertrophy 09/2013  . Dental crown present   . Anxiety   . OCD (obsessive compulsive disorder) 04/02/2014   Past Surgical History  Procedure Laterality Date  . Appendectomy    . Vasectomy  06/02/2013  . Nasal septoplasty w/ turbinoplasty Bilateral 10/02/2013    Procedure: BILATERAL NASAL SEPTOPLASTY WITH TURBINATE REDUCTION;  Surgeon: Serena Colonel, MD;  Location: [facility];  Service: ENT;  Laterality: Bilateral;  . Shoulder arthroscopy Right 11/2013   Current Outpatient Prescriptions on File Prior to Visit  Medication Sig Dispense Refill  . clonazePAM (KLONOPIN) 0.5 MG tablet take 1 tablet by mouth twice a day if needed for anxiety 60 tablet 2  . diazepam (VALIUM) 10 MG tablet Take 1 tablet (10 mg total) by mouth every 8 (eight) hours as needed (for tmj). 30 tablet 0  . diclofenac (VOLTAREN) 75 MG EC tablet Take 1 tablet (75 mg total) by mouth 2 (two) times daily. 30 tablet 0  . Vortioxetine HBr (BRINTELLIX) 10 MG TABS Take 1 tablet (10 mg total) by mouth daily. Take one tablet po q d 30 tablet 5   No current facility-administered medications on file prior to visit.   Allergies  Allergen Reactions  . Cephalexin Hives   Social History   Social History  . Marital Status: Married    Spouse Name: N/A  . Number of  Children: N/A  . Years of Education: N/A   Occupational History  . Not on file.   Social History Main Topics  . Smoking status: Never Smoker   . Smokeless tobacco: Never Used  . Alcohol Use: No  . Drug Use: No  . Sexual Activity: Yes   Other Topics Concern  . Not on file   Social History Narrative     Review of Systems  All other systems reviewed and are negative.      Objective:   Physical Exam  Cardiovascular: Normal rate, regular rhythm and normal heart sounds.   Pulmonary/Chest: Effort normal and breath sounds normal. No respiratory distress. He has no wheezes. He has no rales.  Vitals reviewed.         Assessment & Plan:  Tick bite  TMJ (sprain of temporomandibular joint), initial encounter - Plan: diazepam (VALIUM) 10 MG tablet  Keloid of skin  Notalgia paresthetica  I believe the tick bite may have caused a small keloid. Best the only explanation I can see for the persistence of the red nodule there more than a year after the fact. I believe that the scar may have possibly encapsulated a superficial cutaneous nerve which is the reason why the patient is having neuropathic itching in that area similar to notalgia paresthetica. Treatment options are limited but I did explain to the patient that some  patients have had benefit from subcutaneous sterile injections to decrease the size and severity of keloids. He would like to try that today. Another option would be an excisional biopsy. He would like to try the sterile injection first. Therefore using sterile technique, I injected 1/4 mL of 40 mg per mL Kenalog into the erythematous nodule. The patient tolerated the procedure well without complication. Please see my last office visit. Patient states that the Valium is working better than the Klonopin as far as to relax him and to help him sleep. He would like to stop the Klonopin and switch to the Valium 10 mg 1 by mouth daily at bedtime. I have recommended trying half  a tablet to avoid dependency as rapidly. I've also recommended discontinuing Klonopin. Therefore I'll give the patient Valium 10 mg tablets 1/2-1 by mouth daily at bedtime as needed for anxiety/insomnia, or muscle spasms. I asked the patient to try to make this prescription for 30 tablets last 2 months.

## 2015-08-20 ENCOUNTER — Encounter: Payer: Self-pay | Admitting: Family Medicine

## 2015-08-20 ENCOUNTER — Ambulatory Visit (INDEPENDENT_AMBULATORY_CARE_PROVIDER_SITE_OTHER): Payer: BLUE CROSS/BLUE SHIELD | Admitting: Family Medicine

## 2015-08-20 VITALS — BP 108/64 | HR 78 | Temp 98.4°F | Resp 18 | Ht 70.0 in | Wt 226.0 lb

## 2015-08-20 DIAGNOSIS — J019 Acute sinusitis, unspecified: Secondary | ICD-10-CM

## 2015-08-20 MED ORDER — CEFDINIR 300 MG PO CAPS: 300.0000 mg | ORAL_CAPSULE | Freq: Two times a day (BID) | ORAL | Status: DC

## 2015-08-20 NOTE — Progress Notes (Signed)
   Subjective:    Patient ID: Robert Mcpherson, male    DOB: 02-13-1976, 39 y.o.   MRN: [id number]  HPI Patient symptoms began with a cold 8 days ago. However over the weekend he developed severe pain in his right maxillary sinus, purulent nasal discharge, headache, postnasal drip, and a cough productive of thick green purulent mucus. He denies any chest pain or shortness of breath. He continues to have right maxillary sinus tenderness to palpation Past Medical History  Diagnosis Date  . Deviated nasal septum 09/2013  . Nasal turbinate hypertrophy 09/2013  . Dental crown present   . Anxiety   . OCD (obsessive compulsive disorder) 04/02/2014   Past Surgical History  Procedure Laterality Date  . Appendectomy    . Vasectomy  06/02/2013  . Nasal septoplasty w/ turbinoplasty Bilateral 10/02/2013    Procedure: BILATERAL NASAL SEPTOPLASTY WITH TURBINATE REDUCTION;  Surgeon: Serena ColonelJefry Rosen, MD;  Location: [facility];  Service: ENT;  Laterality: Bilateral;  . Shoulder arthroscopy Right 11/2013   Current Outpatient Prescriptions on File Prior to Visit  Medication Sig Dispense Refill  . clonazePAM (KLONOPIN) 0.5 MG tablet take 1 tablet by mouth twice a day if needed for anxiety 60 tablet 2  . COD LIVER OIL PO Take by mouth.    . diazepam (VALIUM) 10 MG tablet Take 1 tablet (10 mg total) by mouth every 8 (eight) hours as needed (for tmj). 30 tablet 0  . Vortioxetine HBr (BRINTELLIX) 10 MG TABS Take 1 tablet (10 mg total) by mouth daily. Take one tablet po q d 30 tablet 5   No current facility-administered medications on file prior to visit.   Allergies  Allergen Reactions  . Cephalexin Hives   Social History   Social History  . Marital Status: Married    Spouse Name: N/A  . Number of Children: N/A  . Years of Education: N/A   Occupational History  . Not on file.   Social History Main Topics  . Smoking status: Never Smoker   . Smokeless tobacco: Never Used  . Alcohol Use: No  .  Drug Use: No  . Sexual Activity: Yes   Other Topics Concern  . Not on file   Social History Narrative      Review of Systems  All other systems reviewed and are negative.      Objective:   Physical Exam  Constitutional: He appears well-developed and well-nourished. No distress.  HENT:  Right Ear: External ear normal.  Left Ear: External ear normal.  Nose: Mucosal edema and rhinorrhea present. Right sinus exhibits maxillary sinus tenderness.  Mouth/Throat: Oropharynx is clear and moist. No oropharyngeal exudate.  Eyes: Conjunctivae are normal.  Neck: Neck supple.  Cardiovascular: Normal rate, regular rhythm and normal heart sounds.   Pulmonary/Chest: Effort normal and breath sounds normal. No respiratory distress. He has no wheezes. He has no rales.  Lymphadenopathy:    He has no cervical adenopathy.  Skin: He is not diaphoretic.  Vitals reviewed.         Assessment & Plan:  Acute rhinosinusitis - Plan: cefdinir (OMNICEF) 300 MG capsule  I believe the patient's symptoms began with a viral upper respiratory infection and have subsequently converted into a secondary bacterial sinus infection. Begin Omnicef 300 mg by mouth twice a day for 10 days. Recheck in one week if no better or sooner if worse

## 2015-09-09 ENCOUNTER — Ambulatory Visit (INDEPENDENT_AMBULATORY_CARE_PROVIDER_SITE_OTHER): Payer: BLUE CROSS/BLUE SHIELD | Admitting: Physician Assistant

## 2015-09-09 ENCOUNTER — Encounter: Payer: Self-pay | Admitting: Physician Assistant

## 2015-09-09 VITALS — BP 126/84 | HR 68 | Temp 98.2°F | Resp 20 | Wt 229.0 lb

## 2015-09-09 DIAGNOSIS — J988 Other specified respiratory disorders: Secondary | ICD-10-CM | POA: Diagnosis not present

## 2015-09-09 DIAGNOSIS — B9689 Other specified bacterial agents as the cause of diseases classified elsewhere: Principal | ICD-10-CM

## 2015-09-09 MED ORDER — AZITHROMYCIN 250 MG PO TABS: ORAL_TABLET | ORAL | Status: DC

## 2015-09-09 NOTE — Progress Notes (Signed)
Patient ID: Robert Mcpherson MRN: [id number], DOB: [birth year], 39 y.o. Date of Encounter: 09/09/2015, 4:58 PM    Chief Complaint:  Chief Complaint  Patient presents with  . still sick from 2 wks ago    chest hurts with coughing and short of breath     HPI: 39 y.o. year old white male resents with above. [name]. At that time he had right maxillary sinusitis and was treated with Omnicef 300 mg twice a day 10 days. Patient states that he did take that antibiotic as directed and completed all of it. Says that at this time he is no longer having the sinus pain and pressure but now is having cough with chest congestion. Says that at times his chest seems like it is tight and like he may be wheezing. [name] which is a problem because he works as a Administrator. Says that he is unable to get any phlegm out. He never smoked. Never had asthma. Has had no fevers or chills.      Home Meds:   Outpatient Prescriptions Prior to Visit  Medication Sig Dispense Refill  . clonazePAM (KLONOPIN) 0.5 MG tablet take 1 tablet by mouth twice a day if needed for anxiety 60 tablet 2  . COD LIVER OIL PO Take by mouth.    . diazepam (VALIUM) 10 MG tablet Take 1 tablet (10 mg total) by mouth every 8 (eight) hours as needed (for tmj). 30 tablet 0  . Vortioxetine HBr (BRINTELLIX) 10 MG TABS Take 1 tablet (10 mg total) by mouth daily. Take one tablet po q d 30 tablet 5  . cefdinir (OMNICEF) 300 MG capsule Take 1 capsule (300 mg total) by mouth 2 (two) times daily. (Patient not taking: Reported on 09/09/2015) 20 capsule 0   No facility-administered medications prior to visit.    Allergies:  Allergies  Allergen Reactions  . Cephalexin Hives      Review of Systems: See HPI for pertinent ROS. All other ROS negative.    Physical Exam: Blood pressure 126/84, pulse 68, temperature 98.2 F (36.8 C), temperature source Oral,  resp. rate 20, weight 229 lb (103.874 kg)., Body mass index is 32.86 kg/(m^2). General:  WNWD WM. Appears in no acute distress. HEENT: Normocephalic, atraumatic, eyes without discharge, sclera non-icteric, nares are without discharge. Bilateral auditory canals clear, TM's are without perforation, pearly grey and translucent with reflective cone of light bilaterally. Oral cavity moist, posterior pharynx without exudate, erythema, peritonsillar abscess. No tenderness with percussion of frontal or maxillary sinuses bilaterally.  Neck: Supple. No thyromegaly. No lymphadenopathy. Lungs: Clear bilaterally to auscultation without wheezes, rales, or rhonchi. Breathing is unlabored. Heart: Regular rhythm. No murmurs, rubs, or gallops. Msk:  Strength and tone normal for age. Extremities/Skin: Warm and dry.  Neuro: Alert and oriented X 3. Moves all extremities spontaneously. Gait is normal. CNII-XII grossly in tact. Psych:  Responds to questions appropriately with a normal affect.     ASSESSMENT AND PLAN:  39 y.o. year old male with  1. Bacterial respiratory infection He wants to go ahead and obtain chest x-ray. He is concerned that he has been sick for so long. As well he has 4 small children at home. Will check chest x-ray. [name]. Follow-up if symptoms do not resolve after completion of this antibiotic. - azithromycin (ZITHROMAX) 250 MG tablet; Day 1: Take  2 daily. Days 2-5: Take 1 daily.  Dispense: 6 tablet; Refill: 0 - DG Chest 2 View; Future   Signed, Shon HaleMary Beth NeffsDixon, [place], [facility] 09/09/2015 4:58 PM

## 2015-10-15 ENCOUNTER — Encounter: Payer: Self-pay | Admitting: *Deleted

## 2015-11-06 ENCOUNTER — Other Ambulatory Visit: Payer: Self-pay | Admitting: *Deleted

## 2015-11-06 MED ORDER — VORTIOXETINE HBR 10 MG PO TABS: 10.0000 mg | ORAL_TABLET | Freq: Every day | ORAL | Status: DC

## 2015-11-06 NOTE — Telephone Encounter (Signed)
Received fax requesting refill on Trintellix.   Refill appropriate and filled per protocol.  

## 2015-12-13 ENCOUNTER — Telehealth: Payer: Self-pay | Admitting: Family Medicine

## 2015-12-13 MED ORDER — CLONAZEPAM 0.5 MG PO TABS: ORAL_TABLET | ORAL | Status: DC

## 2015-12-13 NOTE — Telephone Encounter (Signed)
Requesting refill on Clonazepam - ? OK to Refill  

## 2015-12-13 NOTE — Telephone Encounter (Signed)
ok 

## 2015-12-13 NOTE — Telephone Encounter (Signed)
Medication called/sent to requested pharmacy  

## 2016-01-29 ENCOUNTER — Telehealth: Payer: Self-pay | Admitting: *Deleted

## 2016-01-29 MED ORDER — OSELTAMIVIR PHOSPHATE 75 MG PO CAPS
75.0000 mg | ORAL_CAPSULE | Freq: Every day | ORAL | Status: DC
Start: 2016-01-29 — End: 2016-08-25

## 2016-01-29 NOTE — Telephone Encounter (Signed)
Spoke to provider, Tamiflu 75 Qd x 10 days.  [facility] when informed.

## 2016-01-29 NOTE — Telephone Encounter (Signed)
Pt called stating that his daughter has been diagnosed with the flu and would like to know if you could prescribe them Tamiflu. Please advise!  [facility] [phone] - [place], [place] - [address] AT [address]

## 2016-02-05 DIAGNOSIS — F4320 Adjustment disorder, unspecified: Secondary | ICD-10-CM | POA: Diagnosis not present

## 2016-02-19 DIAGNOSIS — F4320 Adjustment disorder, unspecified: Secondary | ICD-10-CM | POA: Diagnosis not present

## 2016-02-24 DIAGNOSIS — F331 Major depressive disorder, recurrent, moderate: Secondary | ICD-10-CM | POA: Diagnosis not present

## 2016-03-04 DIAGNOSIS — F4320 Adjustment disorder, unspecified: Secondary | ICD-10-CM | POA: Diagnosis not present

## 2016-03-11 DIAGNOSIS — F4320 Adjustment disorder, unspecified: Secondary | ICD-10-CM | POA: Diagnosis not present

## 2016-04-27 DIAGNOSIS — F331 Major depressive disorder, recurrent, moderate: Secondary | ICD-10-CM | POA: Diagnosis not present

## 2016-04-29 DIAGNOSIS — F4320 Adjustment disorder, unspecified: Secondary | ICD-10-CM | POA: Diagnosis not present

## 2016-05-06 DIAGNOSIS — F4320 Adjustment disorder, unspecified: Secondary | ICD-10-CM | POA: Diagnosis not present

## 2016-05-13 DIAGNOSIS — F4320 Adjustment disorder, unspecified: Secondary | ICD-10-CM | POA: Diagnosis not present

## 2016-05-20 DIAGNOSIS — F4320 Adjustment disorder, unspecified: Secondary | ICD-10-CM | POA: Diagnosis not present

## 2016-05-27 DIAGNOSIS — F4320 Adjustment disorder, unspecified: Secondary | ICD-10-CM | POA: Diagnosis not present

## 2016-06-03 DIAGNOSIS — F4320 Adjustment disorder, unspecified: Secondary | ICD-10-CM | POA: Diagnosis not present

## 2016-06-17 DIAGNOSIS — F4320 Adjustment disorder, unspecified: Secondary | ICD-10-CM | POA: Diagnosis not present

## 2016-06-22 DIAGNOSIS — F422 Mixed obsessional thoughts and acts: Secondary | ICD-10-CM | POA: Diagnosis not present

## 2016-06-24 DIAGNOSIS — F4320 Adjustment disorder, unspecified: Secondary | ICD-10-CM | POA: Diagnosis not present

## 2016-07-08 DIAGNOSIS — F4320 Adjustment disorder, unspecified: Secondary | ICD-10-CM | POA: Diagnosis not present

## 2016-07-22 DIAGNOSIS — F4320 Adjustment disorder, unspecified: Secondary | ICD-10-CM | POA: Diagnosis not present

## 2016-07-29 DIAGNOSIS — F4320 Adjustment disorder, unspecified: Secondary | ICD-10-CM | POA: Diagnosis not present

## 2016-08-12 DIAGNOSIS — F4320 Adjustment disorder, unspecified: Secondary | ICD-10-CM | POA: Diagnosis not present

## 2016-08-13 ENCOUNTER — Encounter: Payer: BLUE CROSS/BLUE SHIELD | Admitting: Family Medicine

## 2016-08-19 DIAGNOSIS — F4320 Adjustment disorder, unspecified: Secondary | ICD-10-CM | POA: Diagnosis not present

## 2016-08-25 ENCOUNTER — Encounter: Payer: Self-pay | Admitting: Family Medicine

## 2016-08-25 ENCOUNTER — Ambulatory Visit (INDEPENDENT_AMBULATORY_CARE_PROVIDER_SITE_OTHER): Payer: BLUE CROSS/BLUE SHIELD | Admitting: Family Medicine

## 2016-08-25 VITALS — BP 118/78 | HR 80 | Temp 98.5°F | Resp 18 | Ht 70.0 in | Wt 226.0 lb

## 2016-08-25 DIAGNOSIS — Z0000 Encounter for general adult medical examination without abnormal findings: Secondary | ICD-10-CM | POA: Diagnosis not present

## 2016-08-25 DIAGNOSIS — Z23 Encounter for immunization: Secondary | ICD-10-CM | POA: Diagnosis not present

## 2016-08-25 LAB — CBC WITH DIFFERENTIAL/PLATELET
BASOS ABS: 0 {cells}/uL (ref 0–200)
BASOS PCT: 0 %
EOS PCT: 1 %
Eosinophils Absolute: 78 cells/uL (ref 15–500)
HCT: 49 % (ref 38.5–50.0)
Hemoglobin: 16.8 g/dL (ref 13.0–17.0)
Lymphocytes Relative: 37 %
Lymphs Abs: 2886 cells/uL (ref 850–3900)
MCH: 30.3 pg (ref 27.0–33.0)
MCHC: 34.3 g/dL (ref 32.0–36.0)
MCV: 88.3 fL (ref 80.0–100.0)
MONOS PCT: 6 %
MPV: 9.8 fL (ref 7.5–12.5)
Monocytes Absolute: 468 cells/uL (ref 200–950)
NEUTROS ABS: 4368 {cells}/uL (ref 1500–7800)
Neutrophils Relative %: 56 %
PLATELETS: 240 10*3/uL (ref 140–400)
RBC: 5.55 MIL/uL (ref 4.20–5.80)
RDW: 13.6 % (ref 11.0–15.0)
WBC: 7.8 10*3/uL (ref 3.8–10.8)

## 2016-08-25 LAB — COMPLETE METABOLIC PANEL WITH GFR
ALT: 68 U/L — ABNORMAL HIGH (ref 9–46)
AST: 30 U/L (ref 10–40)
Albumin: 4.6 g/dL (ref 3.6–5.1)
Alkaline Phosphatase: 50 U/L (ref 40–115)
BILIRUBIN TOTAL: 0.5 mg/dL (ref 0.2–1.2)
BUN: 17 mg/dL (ref 7–25)
CO2: 23 mmol/L (ref 20–31)
Calcium: 9.5 mg/dL (ref 8.6–10.3)
Chloride: 103 mmol/L (ref 98–110)
Creat: 1.07 mg/dL (ref 0.60–1.35)
GFR, EST NON AFRICAN AMERICAN: 87 mL/min (ref >=60)
GFR, Est African American: >89 mL/min (ref >=60)
Glucose, Bld: 80 mg/dL (ref 70–99)
POTASSIUM: 4.5 mmol/L (ref 3.5–5.3)
Sodium: 139 mmol/L (ref 135–146)
Total Protein: 7.1 g/dL (ref 6.1–8.1)

## 2016-08-25 LAB — LIPID PANEL
CHOL/HDL RATIO: 7.6 ratio — AB (ref <=5.0)
Cholesterol: 213 mg/dL — ABNORMAL HIGH (ref 125–200)
HDL: 28 mg/dL — AB (ref >=40)
LDL CALC: 109 mg/dL (ref <130)
Triglycerides: 381 mg/dL — ABNORMAL HIGH (ref <150)
VLDL: 76 mg/dL — ABNORMAL HIGH (ref <30)

## 2016-08-25 NOTE — Progress Notes (Signed)
Subjective:    Patient ID: Robert Mcpherson, male    DOB: 01-17-1976, 40 y.o.   MRN: [id number]  HPI Patient is a very pleasant 40 year old Caucasian male here today for complete physical exam.  His review of systems is negative. He has no specific complaints. [facility] They have discontinued trintellix and replaced it with sertraline. Patient states that the medication is working extremely well for him. Overall he is been doing very well. 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  Overall his family is healthy and he has no specific concerns Past Medical History:  Diagnosis Date  . Anxiety   . Dental crown present   . Deviated nasal septum 09/2013  . Nasal turbinate hypertrophy 09/2013  . OCD (obsessive compulsive disorder) 04/02/2014   Past Surgical History:  Procedure Laterality Date  . APPENDECTOMY    . NASAL SEPTOPLASTY W/ TURBINOPLASTY Bilateral 10/02/2013   Procedure: BILATERAL NASAL SEPTOPLASTY WITH TURBINATE REDUCTION;  Surgeon: Serena ColonelJefry Rosen, MD;  Location: [facility];  Service: ENT;  Laterality: Bilateral;  . SHOULDER ARTHROSCOPY Right 11/2013  . VASECTOMY  06/02/2013   Current Outpatient Prescriptions on File Prior to Visit  Medication Sig Dispense Refill  . clonazePAM (KLONOPIN) 0.5 MG tablet take 1 tablet by mouth twice a day if needed for anxiety 60 tablet 2   No current facility-administered medications on file prior to visit.    Allergies  Allergen Reactions  . Cephalexin Hives   Social History   Social History  . Marital status: Married    Spouse name: N/A  . Number of children: N/A  . Years of education: N/A   Occupational History  . Not on file.   Social History Main Topics  . Smoking status: Never Smoker  . Smokeless tobacco: Never Used  . Alcohol use No  . Drug use: No  . Sexual activity: Yes   Other Topics Concern  . Not on file   Social History Narrative  . No  narrative on file   Family History  Problem Relation Age of Onset  . Heart disease Maternal Uncle 52    heart attack  . Heart disease Maternal Uncle [id number]    CVA      Review of Systems  All other systems reviewed and are negative.      Objective:   Physical Exam  Constitutional: He is oriented to person, place, and time. He appears well-developed and well-nourished. No distress.  HENT:  Head: Normocephalic and atraumatic.  Right Ear: External ear normal.  Left Ear: External ear normal.  Nose: Nose normal.  Mouth/Throat: Oropharynx is clear and moist. No oropharyngeal exudate.  Eyes: Conjunctivae and EOM are normal. Pupils are equal, round, and reactive to light. Right eye exhibits no discharge. Left eye exhibits no discharge. No scleral icterus.  Neck: Normal range of motion. Neck supple. No JVD present. No tracheal deviation present. No thyromegaly present.  Cardiovascular: Normal rate, regular rhythm, normal heart sounds and intact distal pulses.  Exam reveals no gallop and no friction rub.   No murmur heard. Pulmonary/Chest: Effort normal and breath sounds normal. No stridor. No respiratory distress. He has no wheezes. He has no rales. He exhibits no tenderness.  Abdominal: Soft. Bowel sounds are normal. He exhibits no distension and no mass. There is no tenderness. There is no rebound and no guarding. Hernia confirmed negative in the right inguinal area and confirmed negative in the  left inguinal area.  Genitourinary: Testes normal and penis normal. Cremasteric reflex is present. Right testis shows no mass. Left testis shows no mass.  Musculoskeletal: Normal range of motion. He exhibits no edema, tenderness or deformity.  Lymphadenopathy:    He has no cervical adenopathy.       Right: No inguinal adenopathy present.       Left: No inguinal adenopathy present.  Neurological: He is alert and oriented to person, place, and time. He has normal reflexes. He displays normal reflexes.  No cranial nerve deficit. He exhibits normal muscle tone. Coordination normal.  Skin: Skin is warm. No rash noted. He is not diaphoretic. No erythema. No pallor.  Psychiatric: He has a normal mood and affect. His behavior is normal. Judgment and thought content normal.  Vitals reviewed.         Assessment & Plan:  Routine general medical examination at a health care facility - Plan: CBC with Differential/Platelet, COMPLETE METABOLIC PANEL WITH GFR, Lipid panel  Physical exam today is completely normal. I recommended a flu shot but the patient politely declined. He did receive his tetanus vaccine today. I will check a CBC, CMP, fasting lipid panel. He is not yet due for any specific cancer screening. Given his negative review of systems, there is no other recommended lab work for diagnostic testing. Regular anticipatory guidance is provided.

## 2016-08-25 NOTE — Addendum Note (Signed)
Addended by: Legrand RamsWILLIS, Danen Lapaglia B on: 08/25/2016 03:16 PM   Modules accepted: Orders

## 2016-08-26 DIAGNOSIS — F4320 Adjustment disorder, unspecified: Secondary | ICD-10-CM | POA: Diagnosis not present

## 2016-09-04 ENCOUNTER — Encounter: Payer: Self-pay | Admitting: Family Medicine

## 2016-09-16 DIAGNOSIS — F4320 Adjustment disorder, unspecified: Secondary | ICD-10-CM | POA: Diagnosis not present

## 2016-09-23 DIAGNOSIS — F4320 Adjustment disorder, unspecified: Secondary | ICD-10-CM | POA: Diagnosis not present

## 2016-09-30 DIAGNOSIS — F4320 Adjustment disorder, unspecified: Secondary | ICD-10-CM | POA: Diagnosis not present

## 2016-10-14 DIAGNOSIS — F4320 Adjustment disorder, unspecified: Secondary | ICD-10-CM | POA: Diagnosis not present

## 2016-11-05 ENCOUNTER — Encounter: Payer: Self-pay | Admitting: Family Medicine

## 2016-11-05 ENCOUNTER — Ambulatory Visit (INDEPENDENT_AMBULATORY_CARE_PROVIDER_SITE_OTHER): Payer: BLUE CROSS/BLUE SHIELD | Admitting: Family Medicine

## 2016-11-05 VITALS — BP 142/94 | HR 78 | Temp 98.6°F | Resp 16 | Ht 70.0 in | Wt 237.0 lb

## 2016-11-05 DIAGNOSIS — J209 Acute bronchitis, unspecified: Secondary | ICD-10-CM

## 2016-11-05 MED ORDER — ALBUTEROL SULFATE HFA 108 (90 BASE) MCG/ACT IN AERS: 2.0000 | INHALATION_SPRAY | Freq: Four times a day (QID) | RESPIRATORY_TRACT | 0 refills | Status: DC | PRN

## 2016-11-05 MED ORDER — PREDNISONE 20 MG PO TABS: ORAL_TABLET | ORAL | 0 refills | Status: DC

## 2016-11-05 MED ORDER — AZITHROMYCIN 250 MG PO TABS: ORAL_TABLET | ORAL | 0 refills | Status: DC

## 2016-11-05 NOTE — Progress Notes (Signed)
   Subjective:    Patient ID: Robert Mcpherson, male    DOB: 07-21-1976, 41 y.o.   MRN: [id number]  HPI  Patient has been sick for a proximally 3 weeks. He reports a cough productive of yellow mucus. He reports chest tightness, wheezing, shortness of breath. He denies any angina. He denies any pleurisy. He does have subjective fevers. He denies any rhinorrhea or sinus pain. On examination today he has diminished breath sounds bilaterally with faint expiratory wheezing consistent with reactive airway disease Past Medical History:  Diagnosis Date  . Anxiety   . Dental crown present   . Deviated nasal septum 09/2013  . Nasal turbinate hypertrophy 09/2013  . OCD (obsessive compulsive disorder) 04/02/2014   Past Surgical History:  Procedure Laterality Date  . APPENDECTOMY    . NASAL SEPTOPLASTY W/ TURBINOPLASTY Bilateral 10/02/2013   Procedure: BILATERAL NASAL SEPTOPLASTY WITH TURBINATE REDUCTION;  Surgeon: Serena ColonelJefry Rosen, MD;  Location: [facility];  Service: ENT;  Laterality: Bilateral;  . SHOULDER ARTHROSCOPY Right 11/2013  . VASECTOMY  06/02/2013   Current Outpatient Prescriptions on File Prior to Visit  Medication Sig Dispense Refill  . clonazePAM (KLONOPIN) 0.5 MG tablet take 1 tablet by mouth twice a day if needed for anxiety 60 tablet 2  . sertraline (ZOLOFT) 100 MG tablet Take 100 mg by mouth daily.   0   No current facility-administered medications on file prior to visit.    Allergies  Allergen Reactions  . Cephalexin Hives   Social History   Social History  . Marital status: Married    Spouse name: N/A  . Number of children: N/A  . Years of education: N/A   Occupational History  . Not on file.   Social History Main Topics  . Smoking status: Never Smoker  . Smokeless tobacco: Never Used  . Alcohol use No  . Drug use: No  . Sexual activity: Yes   Other Topics Concern  . Not on file   Social History Narrative  . No narrative on file     Review of Systems   All other systems reviewed and are negative.      Objective:   Physical Exam  HENT:  Right Ear: External ear normal.  Left Ear: External ear normal.  Nose: Nose normal.  Mouth/Throat: Oropharynx is clear and moist. No oropharyngeal exudate.  Eyes: Conjunctivae are normal.  Neck: Neck supple.  Cardiovascular: Normal rate and regular rhythm.   Pulmonary/Chest: Effort normal. He has decreased breath sounds. He has wheezes.  Abdominal: Soft. Bowel sounds are normal.  Musculoskeletal: He exhibits no edema.  Lymphadenopathy:    He has no cervical adenopathy.  Vitals reviewed.         Assessment & Plan:  Acute bronchitis, unspecified organism - Plan: predniSONE (DELTASONE) 20 MG tablet, azithromycin (ZITHROMAX) 250 MG tablet, albuterol (PROVENTIL HFA;VENTOLIN HFA) 108 (90 Base) MCG/ACT inhaler  I believe the patient has bronchitis with reactive airway disease. Begin a Z-Pak in addition to a prednisone taper pack and albuterol 2 puffs inhaled every 6 hours as needed. Recheck in one week or immediately if worse. Get chest x-ray if symptoms are not improving

## 2016-11-11 DIAGNOSIS — F4320 Adjustment disorder, unspecified: Secondary | ICD-10-CM | POA: Diagnosis not present

## 2016-11-17 ENCOUNTER — Encounter: Payer: Self-pay | Admitting: Family Medicine

## 2016-11-17 ENCOUNTER — Ambulatory Visit (INDEPENDENT_AMBULATORY_CARE_PROVIDER_SITE_OTHER): Payer: BLUE CROSS/BLUE SHIELD | Admitting: Family Medicine

## 2016-11-17 VITALS — BP 132/76 | HR 122 | Temp 99.5°F | Resp 18 | Ht 70.0 in | Wt 232.0 lb

## 2016-11-17 DIAGNOSIS — J111 Influenza due to unidentified influenza virus with other respiratory manifestations: Secondary | ICD-10-CM | POA: Diagnosis not present

## 2016-11-17 DIAGNOSIS — R509 Fever, unspecified: Secondary | ICD-10-CM

## 2016-11-17 LAB — INFLUENZA A AND B AG, IMMUNOASSAY
INFLUENZA A ANTIGEN: DETECTED — AB
INFLUENZA B ANTIGEN: NOT DETECTED

## 2016-11-17 MED ORDER — OSELTAMIVIR PHOSPHATE 75 MG PO CAPS: 75.0000 mg | ORAL_CAPSULE | Freq: Two times a day (BID) | ORAL | 0 refills | Status: DC

## 2016-11-17 NOTE — Progress Notes (Signed)
Subjective:    Patient ID: Robert Mcpherson, male    DOB: [birth year], 41 y.o.   MRN: [id number]  HPI Patient was seen 12 days ago and diagnosed with bronchitis. Symptoms gradually improved although they never completely went away. However 48 hours ago he developed the sudden onset of fever to 103, with severe body aches, cough, diarrhea, dizziness, and fatigue. Today is tachycardic and appears slightly dehydrated.  Continues to have a cough. Denies any purulent sputum but does report some mild shortness of breath Past Medical History:  Diagnosis Date  . Anxiety   . Dental crown present   . Deviated nasal septum 09/2013  . Nasal turbinate hypertrophy 09/2013  . OCD (obsessive compulsive disorder) 04/02/2014   Past Surgical History:  Procedure Laterality Date  . APPENDECTOMY    . NASAL SEPTOPLASTY W/ TURBINOPLASTY Bilateral 10/02/2013   Procedure: BILATERAL NASAL SEPTOPLASTY WITH TURBINATE REDUCTION;  Surgeon: Serena Colonel, MD;  Location: [facility];  Service: ENT;  Laterality: Bilateral;  . SHOULDER ARTHROSCOPY Right 11/2013  . VASECTOMY  06/02/2013   Current Outpatient Prescriptions on File Prior to Visit  Medication Sig Dispense Refill  . albuterol (PROVENTIL HFA;VENTOLIN HFA) 108 (90 Base) MCG/ACT inhaler Inhale 2 puffs into the lungs every 6 (six) hours as needed for wheezing or shortness of breath. 1 Inhaler 0  . azithromycin (ZITHROMAX) 250 MG tablet 2 tabs poqday1, 1 tab poqday 2-5 (Patient not taking: Reported on 11/17/2016) 6 tablet 0  . clonazePAM (KLONOPIN) 0.5 MG tablet take 1 tablet by mouth twice a day if needed for anxiety 60 tablet 2  . Cod Liver Oil 1000 MG CAPS Take by mouth.    . predniSONE (DELTASONE) 20 MG tablet 3 tabs poqday 1-2, 2 tabs poqday 3-4, 1 tab poqday 5-6 (Patient not taking: Reported on 11/17/2016) 12 tablet 0  . sertraline (ZOLOFT) 100 MG tablet Take 100 mg by mouth daily.   0   No current facility-administered medications on file prior to visit.      Allergies  Allergen Reactions  . Cephalexin Hives   Social History   Social History  . Marital status: Married    Spouse name: N/A  . Number of children: N/A  . Years of education: N/A   Occupational History  . Not on file.   Social History Main Topics  . Smoking status: Never Smoker  . Smokeless tobacco: Never Used  . Alcohol use No  . Drug use: No  . Sexual activity: Yes   Other Topics Concern  . Not on file   Social History Narrative  . No narrative on file      Review of Systems  All other systems reviewed and are negative.      Objective:   Physical Exam  Constitutional: He appears well-developed and well-nourished. He appears ill.  HENT:  Right Ear: External ear normal.  Left Ear: External ear normal.  Nose: Nose normal.  Mouth/Throat: Oropharynx is clear and moist. No oropharyngeal exudate.  Eyes: Conjunctivae are normal. Right eye exhibits no discharge. Left eye exhibits no discharge.  Neck: Neck supple.  Cardiovascular: Regular rhythm and normal heart sounds.  Tachycardia present.   Pulmonary/Chest: Effort normal and breath sounds normal. No respiratory distress. He has no wheezes. He has no rales. He exhibits no tenderness.  Abdominal: Soft. Bowel sounds are normal. He exhibits no distension. There is no tenderness. There is no rebound and no guarding.  Lymphadenopathy:    He has no cervical adenopathy.  Vitals reviewed.         Assessment & Plan:  Fever and chills - Plan: Influenza A and B Ag, Immunoassay  Clinically the patient has the flu. I will treat the patient in regards to the flu test with Tamiflu 75 mg by mouth twice a day. However given his recent illness and his shortness of breath, I would like to get a chest x-ray to evaluate for pneumonia as well. Recommended supportive care including pushing fluids, rest, and ibuprofen for fever and body aches.  Test was positive for type A.  Also gave the patient prescription for Tussionex 1  teaspoon every 12 hours as needed for cough

## 2016-11-25 ENCOUNTER — Other Ambulatory Visit: Payer: Self-pay | Admitting: Family Medicine

## 2016-11-25 ENCOUNTER — Ambulatory Visit
Admission: RE | Admit: 2016-11-25 | Discharge: 2016-11-25 | Disposition: A | Discharge status: Home / Self Care | Payer: BLUE CROSS/BLUE SHIELD | Source: Ambulatory Visit | Attending: Family Medicine | Admitting: Family Medicine

## 2016-11-25 DIAGNOSIS — R079 Chest pain, unspecified: Secondary | ICD-10-CM | POA: Diagnosis not present

## 2016-11-25 DIAGNOSIS — R05 Cough: Secondary | ICD-10-CM | POA: Diagnosis not present

## 2016-11-25 DIAGNOSIS — R509 Fever, unspecified: Secondary | ICD-10-CM

## 2016-11-25 DIAGNOSIS — J111 Influenza due to unidentified influenza virus with other respiratory manifestations: Secondary | ICD-10-CM

## 2016-11-25 NOTE — Telephone Encounter (Signed)
Patient is requesting more cough medicine if possible and also would like to know the results of his chest xray

## 2016-11-26 MED ORDER — HYDROCOD POLST-CPM POLST ER 10-8 MG/5ML PO SUER: 5.0000 mL | Freq: Two times a day (BID) | ORAL | 0 refills | Status: DC | PRN

## 2016-11-26 NOTE — Telephone Encounter (Signed)
Approved through Dr. Tanya NonesPickard - rx printed and give to pt and pt aware of results

## 2016-12-02 DIAGNOSIS — F4320 Adjustment disorder, unspecified: Secondary | ICD-10-CM | POA: Diagnosis not present

## 2016-12-07 DIAGNOSIS — F422 Mixed obsessional thoughts and acts: Secondary | ICD-10-CM | POA: Diagnosis not present

## 2016-12-09 DIAGNOSIS — F4320 Adjustment disorder, unspecified: Secondary | ICD-10-CM | POA: Diagnosis not present

## 2016-12-16 DIAGNOSIS — F4320 Adjustment disorder, unspecified: Secondary | ICD-10-CM | POA: Diagnosis not present

## 2016-12-30 DIAGNOSIS — F4320 Adjustment disorder, unspecified: Secondary | ICD-10-CM | POA: Diagnosis not present

## 2017-01-06 DIAGNOSIS — F4320 Adjustment disorder, unspecified: Secondary | ICD-10-CM | POA: Diagnosis not present

## 2017-01-20 ENCOUNTER — Emergency Department (HOSPITAL_COMMUNITY)
Admission: EM | Admit: 2017-01-20 | Discharge: 2017-01-21 | Disposition: A | Discharge status: Home / Self Care | Payer: BLUE CROSS/BLUE SHIELD | Attending: Emergency Medicine | Admitting: Emergency Medicine

## 2017-01-20 ENCOUNTER — Encounter (HOSPITAL_COMMUNITY): Payer: Self-pay | Admitting: Emergency Medicine

## 2017-01-20 ENCOUNTER — Ambulatory Visit (INDEPENDENT_AMBULATORY_CARE_PROVIDER_SITE_OTHER): Payer: BLUE CROSS/BLUE SHIELD | Admitting: Family Medicine

## 2017-01-20 ENCOUNTER — Encounter: Payer: Self-pay | Admitting: Family Medicine

## 2017-01-20 VITALS — BP 130/86 | HR 100 | Temp 99.4°F | Resp 14 | Ht 70.0 in | Wt 239.0 lb

## 2017-01-20 DIAGNOSIS — R51 Headache: Secondary | ICD-10-CM | POA: Diagnosis not present

## 2017-01-20 DIAGNOSIS — I10 Essential (primary) hypertension: Secondary | ICD-10-CM | POA: Diagnosis not present

## 2017-01-20 DIAGNOSIS — B349 Viral infection, unspecified: Secondary | ICD-10-CM | POA: Diagnosis not present

## 2017-01-20 DIAGNOSIS — R52 Pain, unspecified: Secondary | ICD-10-CM | POA: Diagnosis not present

## 2017-01-20 DIAGNOSIS — R1111 Vomiting without nausea: Secondary | ICD-10-CM

## 2017-01-20 DIAGNOSIS — R509 Fever, unspecified: Secondary | ICD-10-CM | POA: Diagnosis not present

## 2017-01-20 DIAGNOSIS — G4489 Other headache syndrome: Secondary | ICD-10-CM | POA: Diagnosis not present

## 2017-01-20 DIAGNOSIS — G43909 Migraine, unspecified, not intractable, without status migrainosus: Secondary | ICD-10-CM | POA: Diagnosis not present

## 2017-01-20 DIAGNOSIS — G43009 Migraine without aura, not intractable, without status migrainosus: Secondary | ICD-10-CM | POA: Insufficient documentation

## 2017-01-20 HISTORY — DX: Major depressive disorder, single episode, unspecified: F32.9

## 2017-01-20 HISTORY — DX: Depression, unspecified: F32.A

## 2017-01-20 LAB — CBC
HEMATOCRIT: 46.6 % (ref 38.5–50.0)
HEMOGLOBIN: 15.7 g/dL (ref 13.0–17.0)
MCH: 30.3 pg (ref 27.0–33.0)
MCHC: 33.7 g/dL (ref 32.0–36.0)
MCV: 89.8 fL (ref 80.0–100.0)
Platelets: 130 10*3/uL — ABNORMAL LOW (ref 140–400)
RBC: 5.19 MIL/uL (ref 4.20–5.80)
RDW: 13.9 % (ref 11.0–15.0)
WBC: 5.3 10*3/uL (ref 3.8–10.8)

## 2017-01-20 MED ORDER — DIPHENHYDRAMINE HCL 50 MG/ML IJ SOLN
25.0000 mg | Freq: Once | INTRAMUSCULAR | Status: AC
  Administered 2017-01-21: 25 mg via INTRAVENOUS
  Filled 2017-01-20: qty 1

## 2017-01-20 MED ORDER — SODIUM CHLORIDE 0.9 % IV BOLUS (SEPSIS)
1000.0000 mL | Freq: Once | INTRAVENOUS | Status: AC
  Administered 2017-01-21: 1000 mL via INTRAVENOUS

## 2017-01-20 MED ORDER — PROCHLORPERAZINE EDISYLATE 5 MG/ML IJ SOLN
10.0000 mg | Freq: Once | INTRAMUSCULAR | Status: AC
  Administered 2017-01-21: 10 mg via INTRAVENOUS
  Filled 2017-01-20: qty 2

## 2017-01-20 MED ORDER — HYDROCODONE-ACETAMINOPHEN 5-325 MG PO TABS: 1.0000 | ORAL_TABLET | Freq: Four times a day (QID) | ORAL | 0 refills | Status: DC | PRN

## 2017-01-20 NOTE — ED Triage Notes (Signed)
Per EMS, patient from home presenting with headache and vomiting. Pt sensitive to light and sound.  Took phenergan at 2050.  No hx of migraines or hypertension.  Hx of depression.  160/122, 97% RA, HR 90, RR 18.

## 2017-01-20 NOTE — ED Provider Notes (Signed)
[facility]-EMERGENCY DEPT Provider Note   CSN: [id number] Arrival date & time: 01/20/17  2301  By signing my name below, I, Teofilo Pod, attest that this documentation has been prepared under the direction and in the presence of Shon Baton, MD . Electronically Signed: Teofilo Pod, ED Scribe. 01/20/2017. 11:45 PM.    History   Chief Complaint Chief Complaint  Patient presents with  . Headache  . Emesis   The history is provided by the patient. No language interpreter was used.   HPI Comments:  Robert Mcpherson is a 41 y.o. male who presents to the Emergency Department complaining of constant, worsening headache x 2 days. He describes the headache as "sharp and pressure" and rates that pain at 10/10. Pt complains of associated vomiting, photophobia, sensitivity to sound, fever of 101.9. Denies any hx of migraines of HTN and denies any sick contact. Pt has taken ibuprofen, and he was given hydrocodone and phenergan today with no relief for any symptoms. Patient saw his primary physician today for the same symptoms. Reports having the flu back in January. Pt denies neck pain, diarrhea, abdominal pain, congestion, cough, numbness, tingling, visual changes.    Past Medical History:  Diagnosis Date  . Anxiety   . Dental crown present   . Depression   . Deviated nasal septum 09/2013  . Nasal turbinate hypertrophy 09/2013  . OCD (obsessive compulsive disorder) 04/02/2014    Patient Active Problem List   Diagnosis Date Noted  . OCD (obsessive compulsive disorder) 04/02/2014  . Anxiety   . Generalized anxiety disorder 02/06/2013    Past Surgical History:  Procedure Laterality Date  . APPENDECTOMY    . NASAL SEPTOPLASTY W/ TURBINOPLASTY Bilateral 10/02/2013   Procedure: BILATERAL NASAL SEPTOPLASTY WITH TURBINATE REDUCTION;  Surgeon: Serena Colonel, MD;  Location: [facility];  Service: ENT;  Laterality: Bilateral;  . SHOULDER ARTHROSCOPY Right 11/2013  . VASECTOMY   06/02/2013       Home Medications    Prior to Admission medications   Medication Sig Start Date End Date Taking? Authorizing Provider  clonazePAM (KLONOPIN) 1 MG tablet Take 0.5-1 mg by mouth 2 (two) times daily as needed for anxiety.   Yes Historical Provider, MD  HYDROcodone-acetaminophen (NORCO) 5-325 MG tablet Take 1 tablet by mouth every 6 (six) hours as needed for moderate pain. 01/20/17  Yes Salley Scarlet, MD  promethazine (PHENERGAN) 25 MG tablet Take 25 mg by mouth every 6 (six) hours as needed for nausea or vomiting.   Yes Historical Provider, MD  QUEtiapine (SEROQUEL) 50 MG tablet Take 50 mg by mouth at bedtime.   Yes Historical Provider, MD  sertraline (ZOLOFT) 100 MG tablet Take 100 mg by mouth daily.  08/19/16  Yes Historical Provider, MD  clonazePAM (KLONOPIN) 0.5 MG tablet take 1 tablet by mouth twice a day if needed for anxiety Patient not taking: Reported on 01/21/2017 12/13/15   Donita Brooks, MD    Family History Family History  Problem Relation Age of Onset  . Heart disease Maternal Uncle 52    heart attack  . Heart disease Maternal Uncle 49    CVA    Social History Social History  Substance Use Topics  . Smoking status: Never Smoker  . Smokeless tobacco: Never Used  . Alcohol use No     Allergies   Cephalexin   Review of Systems Review of Systems  Constitutional: Positive for fever.  HENT: Negative for congestion.   Eyes:  Positive for photophobia. Negative for visual disturbance.  Respiratory: Negative for cough.   Gastrointestinal: Positive for nausea and vomiting. Negative for abdominal pain and diarrhea.  Musculoskeletal: Negative for neck pain and neck stiffness.  Neurological: Positive for headaches. Negative for numbness.  All other systems reviewed and are negative.    Physical Exam Updated Vital Signs BP (!) 141/92   Pulse 93   Temp 98.9 F (37.2 C) (Oral)   Resp 16   Ht 5\' 9"  (1.753 m)   Wt 239 lb (108.4 kg)   SpO2 92%    BMI 35.29 kg/m   Physical Exam  Constitutional: He is oriented to person, place, and time. He appears well-developed and well-nourished.  Uncomfortable appearing, no acute distress  HENT:  Head: Normocephalic and atraumatic.  Eyes: EOM are normal. Pupils are equal, round, and reactive to light.  Neck: Normal range of motion. Neck supple.  No evidence of meningismus  Cardiovascular: Normal rate, regular rhythm and normal heart sounds.   No murmur heard. Pulmonary/Chest: Effort normal and breath sounds normal. No respiratory distress. He has no wheezes.  Abdominal: Soft. Bowel sounds are normal. There is no tenderness. There is no rebound.  Musculoskeletal: He exhibits no edema.  Lymphadenopathy:    He has no cervical adenopathy.  Neurological: He is alert and oriented to person, place, and time.  Cranial nerves II through XII intact, 5 out of 5 strength in all 4 extremities  Skin: Skin is warm.  Diaphoretic  Psychiatric: He has a normal mood and affect.  Nursing note and vitals reviewed.    ED Treatments / Results  DIAGNOSTIC STUDIES:  Oxygen Saturation is 98% on RA, normal by my interpretation.    COORDINATION OF CARE:  11:45 PM Discussed treatment plan with pt at bedside and pt agreed to plan.   Labs (all labs ordered are listed, but only abnormal results are displayed) Labs Reviewed  CBC WITH DIFFERENTIAL/PLATELET - Abnormal; Notable for the following:       Result Value   Platelets 138 (*)    All other components within normal limits  BASIC METABOLIC PANEL - Abnormal; Notable for the following:    Glucose, Bld 130 (*)    All other components within normal limits  CSF CELL COUNT WITH DIFFERENTIAL - Abnormal; Notable for the following:    Appearance, CSF HAZY (*)    RBC Count, CSF 440 (*)    All other components within normal limits  CSF CELL COUNT WITH DIFFERENTIAL - Abnormal; Notable for the following:    RBC Count, CSF 14 (*)    All other components within  normal limits  PROTEIN, CSF - Abnormal; Notable for the following:    Total  Protein, CSF 48 (*)    All other components within normal limits  CSF CULTURE  INFLUENZA PANEL BY PCR (TYPE A & B)  GLUCOSE, CSF    EKG  EKG Interpretation None       Radiology Ct Head Wo Contrast  Result Date: 01/21/2017 CLINICAL DATA:  Acute onset of headache and vomiting. Initial encounter. EXAM: CT HEAD WITHOUT CONTRAST TECHNIQUE: Contiguous axial images were obtained from the base of the skull through the vertex without intravenous contrast. COMPARISON:  Maxillofacial CT performed 12/30/2011 FINDINGS: Brain: No evidence of acute infarction, hemorrhage, hydrocephalus, extra-axial collection or mass lesion/mass effect. The posterior fossa, including the cerebellum, brainstem and fourth ventricle, is within normal limits. The third and lateral ventricles, and basal ganglia are unremarkable in appearance. The cerebral  hemispheres are symmetric in appearance, with normal gray-white differentiation. No mass effect or midline shift is seen. Vascular: No hyperdense vessel or unexpected calcification. Skull: There is no evidence of fracture; visualized osseous structures are unremarkable in appearance. Sinuses/Orbits: The orbits are within normal limits. The paranasal sinuses and mastoid air cells are well-aerated. Other: No significant soft tissue abnormalities are seen. IMPRESSION: Unremarkable noncontrast CT of the head. Electronically Signed   By: Roanna Raider M.D.   On: 01/21/2017 04:02    Procedures .Lumbar Puncture Date/Time: 01/21/2017 5:24 AM Performed by: Shon Baton Authorized by: Shon Baton   Consent:    Consent obtained:  Written   Consent given by:  Patient and spouse   Risks discussed:  Bleeding, infection, pain and headache   Alternatives discussed:  Delayed treatment Pre-procedure details:    Procedure purpose:  Diagnostic   Preparation: Patient was prepped and draped in usual  sterile fashion   Anesthesia (see MAR for exact dosages):    Anesthesia method:  Local infiltration   Local anesthetic:  Lidocaine 2% w/o epi Procedure details:    Lumbar space:  L3-L4 interspace   Patient position:  L lateral decubitus   Needle gauge:  20   Needle type:  Spinal needle - Quincke tip   Needle length (in):  3.5   Ultrasound guidance: no     Number of attempts:  1   Opening pressure (cm H2O):  32   Fluid appearance:  Clear   Tubes of fluid:  4   Total volume (ml):  8 Post-procedure:    Puncture site:  Adhesive bandage applied   Patient tolerance of procedure:  Tolerated well, no immediate complications   (including critical care time)    Medications Ordered in ED Medications  lidocaine (PF) (XYLOCAINE) 1 % injection (not administered)  sodium chloride 0.9 % bolus 1,000 mL (0 mLs Intravenous Stopped 01/21/17 0340)  prochlorperazine (COMPAZINE) injection 10 mg (10 mg Intravenous Given 01/21/17 0041)  diphenhydrAMINE (BENADRYL) injection 25 mg (25 mg Intravenous Given 01/21/17 0043)  dexamethasone (DECADRON) injection 10 mg (10 mg Intravenous Given 01/21/17 0341)  promethazine (PHENERGAN) injection 25 mg (25 mg Intravenous Given 01/21/17 0342)  diphenhydrAMINE (BENADRYL) injection 25 mg (25 mg Intravenous Given 01/21/17 0341)     Initial Impression / Assessment and Plan / ED Course  I have reviewed the triage vital signs and the nursing notes.  Pertinent labs & imaging results that were available during my care of the patient were reviewed by me and considered in my medical decision making (see chart for details).    She presents with worsening headache. Reports fevers at home. He is ill-appearing but nontoxic. Nonfocal. No history of headaches. Initial vital signs are reassuring. He is afebrile here. Patient was initially given a migraine cocktail. Minimal improvement of symptoms. Initial lab work reassuring. No leukocytosis. Flu screen is negative. On repeat exam,  patient reports recurrence of headache and worsening of headache. I discussed at length with the patient and his wife the possibility of meningitis. While he is overall nontoxic appearing, I cannot rule out meningitis without an LP. Risk and benefits were explained. Lumbar puncture performed with no immediate complications. CSF results showed no evidence of infection. Appropriately downtrending RBC count. Patient had a full workup. After a second dosage of medication, he reports improvement of headache. He is resting comfortably. Tolerating fluids. He has Phenergan at home. Recommend Phenergan and Benadryl as needed for headaches.  After history, exam, and medical  workup I feel the patient has been appropriately medically screened and is safe for discharge home. Pertinent diagnoses were discussed with the patient. Patient was given return precautions.   Final Clinical Impressions(s) / ED Diagnoses   Final diagnoses:  Migraine without aura and without status migrainosus, not intractable  Non-intractable vomiting without nausea, unspecified vomiting type    New Prescriptions New Prescriptions   No medications on file   I personally performed the services described in this documentation, which was scribed in my presence. The recorded information has been reviewed and is accurate.     Shon Baton, MD 01/21/17 [phone]

## 2017-01-20 NOTE — Progress Notes (Signed)
   Subjective:    Patient ID: Robert Mcpherson, male    DOB: [birth year], 41 y.o.   MRN: [id number]  Patient presents for Illness (x1 day- HA, fever (T max 101.9), body aches, sinus pressure, nausea)  Pt here were fever, body aches,Sinus pressure, Headache that started yesterday. He states that headache was so severe he started taking significant amount of ibuprofen states that he is taking 15 tablets in the past day. He is also taken Tylenol. His fever does reduce with the medication. He has not had any cough or congestion states that everything just hit him at once. He was treated for influenza back in January he had influenza A he took Tamiflu. He also was treated for bronchitis 2 weeks before he had influenza. There has been some sickness in the home his daughter has an ear infection currently. He has not had any vomiting but has had some nausea with his headache.  He does feel better right after he takes the fever reducer and his headache improves he was able to Eat last night.  Wife with him Review Of Systems:  GEN- denies fatigue, fever, weight loss,weakness, recent illness HEENT- denies eye drainage, change in vision, nasal discharge, CVS- denies chest pain, palpitations RESP- denies SOB, cough, wheeze ABD- + N/ denies V, change in stools, abd pain GU- denies dysuria, hematuria, dribbling, incontinence MSK- denies joint pain, muscle aches, injury Neuro- +headache, dizziness, syncope, seizure activity       Objective:    BP 130/86   Pulse 100   Temp 99.4 F (37.4 C) (Oral)   Resp 14   Ht 5\' 10"  (1.778 m)   Wt 239 lb (108.4 kg)   SpO2 99%   BMI 34.29 kg/m  GEN- NAD, alert and oriented x3, laying on bed, with lights off HEENT- PERRL, EOMI, non injected sclera, pink conjunctiva, MMM, oropharynx clear, nares clear rhinorrhea, TM and canals clear bilat , no nystagmus Neck- Supple, no LAD , neg meningeal signs/nuchal rigidity CVS- RRR, no  murmur RESP-CTAB ABD-NABS,soft,NT,ND Neuro-CNII-XII intact normal speech , normal gait  EXT- No edema Pulses- Radial 2+   Normal WBC     Assessment & Plan:      Problem List Items Addressed This Visit    None    Visit Diagnoses    Fever, unspecified fever cause    -  Primary   Relevant Orders   CBC (Completed)   Viral illness       Flu like  viral symptoms. No swab available here in office, pt has tamiflu at home from wife, start BID for 5 days, given norco for headache. No further NSAIDS until tonight. Discussed proper dosing 800mg  every 8 hours or 600mg  every 6 hours No sign of Meningitis no sign of any bacterial infection especially as this is within the first 24 hours of the illness. I said he will have more symptoms over the next couple of days. He is to stay hydrated  Go to ER if he has worsening headache or life-threatening symptoms.       Note: This dictation was prepared with Dragon dictation along with smaller phrase technology. Any transcriptional errors that result from this process are unintentional.

## 2017-01-20 NOTE — Patient Instructions (Signed)
Take pain medication  Take TAMIFLU 75MG  twice a day  Call if anything worsens

## 2017-01-21 ENCOUNTER — Emergency Department (HOSPITAL_COMMUNITY): Payer: BLUE CROSS/BLUE SHIELD

## 2017-01-21 DIAGNOSIS — R51 Headache: Secondary | ICD-10-CM | POA: Diagnosis not present

## 2017-01-21 LAB — CBC WITH DIFFERENTIAL/PLATELET
BASOS ABS: 0 10*3/uL (ref 0.0–0.1)
Basophils Relative: 0 %
Eosinophils Absolute: 0 10*3/uL (ref 0.0–0.7)
Eosinophils Relative: 0 %
HEMATOCRIT: 44.8 % (ref 39.0–52.0)
Hemoglobin: 15.7 g/dL (ref 13.0–17.0)
LYMPHS ABS: 0.9 10*3/uL (ref 0.7–4.0)
LYMPHS PCT: 11 %
MCH: 30.5 pg (ref 26.0–34.0)
MCHC: 35 g/dL (ref 30.0–36.0)
MCV: 87.2 fL (ref 78.0–100.0)
MONO ABS: 0.5 10*3/uL (ref 0.1–1.0)
Monocytes Relative: 6 %
NEUTROS ABS: 6.7 10*3/uL (ref 1.7–7.7)
Neutrophils Relative %: 83 %
Platelets: 138 10*3/uL — ABNORMAL LOW (ref 150–400)
RBC: 5.14 MIL/uL (ref 4.22–5.81)
RDW: 13.7 % (ref 11.5–15.5)
WBC: 8 10*3/uL (ref 4.0–10.5)

## 2017-01-21 LAB — CSF CELL COUNT WITH DIFFERENTIAL
RBC COUNT CSF: 14 /mm3 — AB
RBC Count, CSF: 440 /mm3 — ABNORMAL HIGH
TUBE #: 1
TUBE #: 4
WBC, CSF: 1 /mm3 (ref 0–5)
WBC, CSF: 1 /mm3 (ref 0–5)

## 2017-01-21 LAB — PROTEIN, CSF: TOTAL PROTEIN, CSF: 48 mg/dL — AB (ref 15–45)

## 2017-01-21 LAB — BASIC METABOLIC PANEL
ANION GAP: 9 (ref 5–15)
BUN: 7 mg/dL (ref 6–20)
CO2: 23 mmol/L (ref 22–32)
Calcium: 8.9 mg/dL (ref 8.9–10.3)
Chloride: 105 mmol/L (ref 101–111)
Creatinine, Ser: 0.79 mg/dL (ref 0.61–1.24)
GFR calc Af Amer: >60 mL/min (ref >60)
GFR calc non Af Amer: >60 mL/min (ref >60)
GLUCOSE: 130 mg/dL — AB (ref 65–99)
POTASSIUM: 3.9 mmol/L (ref 3.5–5.1)
Sodium: 137 mmol/L (ref 135–145)

## 2017-01-21 LAB — GLUCOSE, CSF: Glucose, CSF: 68 mg/dL (ref 40–70)

## 2017-01-21 LAB — INFLUENZA PANEL BY PCR (TYPE A & B)
INFLAPCR: NEGATIVE
Influenza B By PCR: NEGATIVE

## 2017-01-21 MED ORDER — PROMETHAZINE HCL 25 MG/ML IJ SOLN
25.0000 mg | Freq: Once | INTRAMUSCULAR | Status: AC
  Administered 2017-01-21: 25 mg via INTRAVENOUS
  Filled 2017-01-21: qty 1

## 2017-01-21 MED ORDER — LIDOCAINE HCL (PF) 1 % IJ SOLN
INTRAMUSCULAR | Status: AC
  Filled 2017-01-21: qty 5

## 2017-01-21 MED ORDER — DIPHENHYDRAMINE HCL 50 MG/ML IJ SOLN
25.0000 mg | Freq: Once | INTRAMUSCULAR | Status: AC
  Administered 2017-01-21: 25 mg via INTRAVENOUS
  Filled 2017-01-21: qty 1

## 2017-01-21 MED ORDER — DEXAMETHASONE SODIUM PHOSPHATE 10 MG/ML IJ SOLN
10.0000 mg | Freq: Once | INTRAMUSCULAR | Status: AC
  Administered 2017-01-21: 10 mg via INTRAVENOUS
  Filled 2017-01-21: qty 1

## 2017-01-21 NOTE — ED Notes (Signed)
LP performed, successful. Tubes collected and walked to lab.

## 2017-01-21 NOTE — ED Notes (Signed)
EDP at bedside  

## 2017-01-21 NOTE — Discharge Instructions (Signed)
You were seen today for a headache. Your workup including an LP and a CT scan does not show any evidence of infection or bleeding. This may be related to recent upper respiratory symptoms. Follow-up with her primary physician if you have recurrence or worsening of symptoms.

## 2017-01-21 NOTE — ED Notes (Signed)
Called [employer] for update on flu swab, they are unable to run d/t computers being down. EDP aware.

## 2017-01-24 LAB — CSF CULTURE W GRAM STAIN: Culture: NO GROWTH

## 2017-01-24 LAB — CSF CULTURE

## 2017-01-25 ENCOUNTER — Encounter: Payer: Self-pay | Admitting: Family Medicine

## 2017-01-25 ENCOUNTER — Ambulatory Visit (INDEPENDENT_AMBULATORY_CARE_PROVIDER_SITE_OTHER): Payer: BLUE CROSS/BLUE SHIELD | Admitting: Family Medicine

## 2017-01-25 VITALS — BP 138/100 | HR 86 | Temp 98.2°F | Resp 18 | Ht 70.0 in | Wt 232.0 lb

## 2017-01-25 DIAGNOSIS — R51 Headache: Secondary | ICD-10-CM

## 2017-01-25 DIAGNOSIS — R519 Headache, unspecified: Secondary | ICD-10-CM

## 2017-01-25 MED ORDER — PROPRANOLOL HCL ER 80 MG PO CP24: 80.0000 mg | ORAL_CAPSULE | Freq: Every day | ORAL | 3 refills | Status: DC

## 2017-01-25 MED ORDER — BUTALBITAL-APAP-CAFFEINE 50-325-40 MG PO TABS: 1.0000 | ORAL_TABLET | Freq: Four times a day (QID) | ORAL | 0 refills | Status: DC | PRN

## 2017-01-25 NOTE — Progress Notes (Signed)
Subjective:    Patient ID: Robert Mcpherson, male    DOB: 26-Sep-1976, 41 y.o.   MRN: [id number]  HPI  Please see the office visit from last Wednesday, the patient was seen by my partner and diagnosed with a viral syndrome. He had body aches, fever, and headache. That night, the headache became intractable. He continued to have a high fever diffuse body aches but no other symptoms. He went to the emergency room. In the emergency room and perform a spinal tap. CSF culture was negative. There were no white blood cells. There was a few red blood cells. Glucose was normal. Protein was mildly elevated. Did not appear to be viral meningitis. CT scan of the brain was normal. Your subsided that day. However ever since, patient reports a constant headache. He reports photophobia. He reports nausea. He denies any aura or visual changes.  Denies a family history of migraines. He denies any history of head injury. He denies any recent tick bite or travel Past Medical History:  Diagnosis Date  . Anxiety   . Dental crown present   . Depression   . Deviated nasal septum 09/2013  . Nasal turbinate hypertrophy 09/2013  . OCD (obsessive compulsive disorder) 04/02/2014   Past Surgical History:  Procedure Laterality Date  . APPENDECTOMY    . NASAL SEPTOPLASTY W/ TURBINOPLASTY Bilateral 10/02/2013   Procedure: BILATERAL NASAL SEPTOPLASTY WITH TURBINATE REDUCTION;  Surgeon: Serena ColonelJefry Rosen, MD;  Location: [facility];  Service: ENT;  Laterality: Bilateral;  . SHOULDER ARTHROSCOPY Right 11/2013  . VASECTOMY  06/02/2013   Current Outpatient Prescriptions on File Prior to Visit  Medication Sig Dispense Refill  . clonazePAM (KLONOPIN) 1 MG tablet Take 0.5-1 mg by mouth 2 (two) times daily as needed for anxiety.    . promethazine (PHENERGAN) 25 MG tablet Take 25 mg by mouth every 6 (six) hours as needed for nausea or vomiting.    Marland Kitchen. QUEtiapine (SEROQUEL) 50 MG tablet Take 50 mg by mouth at bedtime.    . sertraline  (ZOLOFT) 100 MG tablet Take 100 mg by mouth daily.   0   No current facility-administered medications on file prior to visit.    Allergies  Allergen Reactions  . Cephalexin Hives   Social History   Social History  . Marital status: Married    Spouse name: N/A  . Number of children: N/A  . Years of education: N/A   Occupational History  . Not on file.   Social History Main Topics  . Smoking status: Never Smoker  . Smokeless tobacco: Never Used  . Alcohol use No  . Drug use: No  . Sexual activity: Yes   Other Topics Concern  . Not on file   Social History Narrative  . No narrative on file      Review of Systems  All other systems reviewed and are negative.      Objective:   Physical Exam  Constitutional: He is oriented to person, place, and time. He appears well-developed and well-nourished.  HENT:  Right Ear: External ear normal.  Left Ear: External ear normal.  Nose: Nose normal.  Mouth/Throat: Oropharynx is clear and moist. No oropharyngeal exudate.  Eyes: Conjunctivae and EOM are normal. Pupils are equal, round, and reactive to light.  Neck: Normal range of motion. Neck supple. No thyromegaly present.  Cardiovascular: Normal rate, regular rhythm and normal heart sounds.   No murmur heard. Pulmonary/Chest: Effort normal and breath sounds normal.  Lymphadenopathy:  He has no cervical adenopathy.  Neurological: He is alert and oriented to person, place, and time. He has normal reflexes. No cranial nerve deficit. He exhibits normal muscle tone. Coordination normal.  Skin: No rash noted. No erythema.  Psychiatric: He has a normal mood and affect. His behavior is normal. Judgment and thought content normal.  Vitals reviewed.         Assessment & Plan:  Acute intractable headache, unspecified headache type  The gyrus is been ruled out. CT scan was normal. Patient has now had a daily headache for one week associated with photophobia that is pulsatile in  nature and also nausea. Migraines on the differential diagnosis. Blood pressures elevated. Begin propranolol long-acting 80 mg a day for blood pressure and as a migraine preventative. Begin Fioricet 1 tablet every 4-6 hours as needed for headache. Recheck in one week. Consider Topamax versus neurology consultation if headache persists

## 2017-01-27 DIAGNOSIS — F4320 Adjustment disorder, unspecified: Secondary | ICD-10-CM | POA: Diagnosis not present

## 2017-02-01 ENCOUNTER — Ambulatory Visit: Payer: Self-pay | Admitting: Family Medicine

## 2017-02-02 ENCOUNTER — Ambulatory Visit (INDEPENDENT_AMBULATORY_CARE_PROVIDER_SITE_OTHER): Payer: BLUE CROSS/BLUE SHIELD | Admitting: Family Medicine

## 2017-02-02 VITALS — BP 138/96 | HR 80 | Temp 98.0°F | Wt 236.0 lb

## 2017-02-02 DIAGNOSIS — R51 Headache: Secondary | ICD-10-CM

## 2017-02-02 DIAGNOSIS — R519 Headache, unspecified: Secondary | ICD-10-CM

## 2017-02-02 NOTE — Progress Notes (Signed)
Subjective:    Patient ID: Robert Mcpherson, male    DOB: Aug 12, 1976, 41 y.o.   MRN: [id number]  HPI  01/25/17 Please see the office visit from last Wednesday, the patient was seen by my partner and diagnosed with a viral syndrome. He had body aches, fever, and headache. That night, the headache became intractable. He continued to have a high fever diffuse body aches but no other symptoms. He went to the emergency room. In the emergency room and perform a spinal tap. CSF culture was negative. There were no white blood cells. There was a few red blood cells. Glucose was normal. Protein was mildly elevated. Did not appear to be viral meningitis. CT scan of the brain was normal. Your subsided that day. However ever since, patient reports a constant headache. He reports photophobia. He reports nausea. He denies any aura or visual changes.  Denies a family history of migraines. He denies any history of head injury. He denies any recent tick bite or travel.  At that time, my plan was: Meningitis been ruled out. CT scan was normal. Patient has now had a daily headache for one week associated with photophobia that is pulsatile in nature and also nausea. Migraines on the differential diagnosis. Blood pressures elevated. Begin propranolol long-acting 80 mg a day for blood pressure and as a migraine preventative. Begin Fioricet 1 tablet every 4-6 hours as needed for headache. Recheck in one week. Consider Topamax versus neurology consultation if headache persists.  02/02/17 Subsided 24 hours after starting Fioricet. He has been headache free for over a week. However he continues to complain of fatigue, dizziness particularly upon standing. This seems to be worse since starting propranolol. Furthermore his blood pressure is not adequately controlled even on the propranolol. He denies any syncope or near-syncope. He denies any neck stiffness. He denies any headache or blurry vision. He denies any fevers. Overall he feels  much better Past Medical History:  Diagnosis Date  . Anxiety   . Dental crown present   . Depression   . Deviated nasal septum 09/2013  . Nasal turbinate hypertrophy 09/2013  . OCD (obsessive compulsive disorder) 04/02/2014   Past Surgical History:  Procedure Laterality Date  . APPENDECTOMY    . NASAL SEPTOPLASTY W/ TURBINOPLASTY Bilateral 10/02/2013   Procedure: BILATERAL NASAL SEPTOPLASTY WITH TURBINATE REDUCTION;  Surgeon: Serena Colonel, MD;  Location: [facility];  Service: ENT;  Laterality: Bilateral;  . SHOULDER ARTHROSCOPY Right 11/2013  . VASECTOMY  06/02/2013   Current Outpatient Prescriptions on File Prior to Visit  Medication Sig Dispense Refill  . butalbital-acetaminophen-caffeine (FIORICET, ESGIC) 50-325-40 MG tablet Take 1-2 tablets by mouth every 6 (six) hours as needed for headache. 20 tablet 0  . clonazePAM (KLONOPIN) 1 MG tablet Take 0.5-1 mg by mouth 2 (two) times daily as needed for anxiety.    . promethazine (PHENERGAN) 25 MG tablet Take 25 mg by mouth every 6 (six) hours as needed for nausea or vomiting.    . propranolol ER (INDERAL LA) 80 MG 24 hr capsule Take 1 capsule (80 mg total) by mouth daily. 30 capsule 3  . QUEtiapine (SEROQUEL) 50 MG tablet Take 50 mg by mouth at bedtime.    . sertraline (ZOLOFT) 100 MG tablet Take 100 mg by mouth daily.   0   No current facility-administered medications on file prior to visit.    Allergies  Allergen Reactions  . Cephalexin Hives   Social History   Social History  .  Marital status: Married    Spouse name: N/A  . Number of children: N/A  . Years of education: N/A   Occupational History  . Not on file.   Social History Main Topics  . Smoking status: Never Smoker  . Smokeless tobacco: Never Used  . Alcohol use No  . Drug use: No  . Sexual activity: Yes   Other Topics Concern  . Not on file   Social History Narrative  . No narrative on file      Review of Systems  All other systems  reviewed and are negative.      Objective:   Physical Exam  Constitutional: He is oriented to person, place, and time. He appears well-developed and well-nourished.  HENT:  Right Ear: External ear normal.  Left Ear: External ear normal.  Nose: Nose normal.  Mouth/Throat: Oropharynx is clear and moist. No oropharyngeal exudate.  Eyes: Conjunctivae and EOM are normal. Pupils are equal, round, and reactive to light.  Neck: Normal range of motion. Neck supple. No thyromegaly present.  Cardiovascular: Normal rate, regular rhythm and normal heart sounds.   No murmur heard. Pulmonary/Chest: Effort normal and breath sounds normal.  Lymphadenopathy:    He has no cervical adenopathy.  Neurological: He is alert and oriented to person, place, and time. He has normal reflexes. No cranial nerve deficit. He exhibits normal muscle tone. Coordination normal.  Skin: No rash noted. No erythema.  Psychiatric: He has a normal mood and affect. His behavior is normal. Judgment and thought content normal.  Vitals reviewed.         Assessment & Plan:  Acute intractable headache, unspecified headache type Patient had a viral syndrome and likely had headaches due to the viral syndrome and possibly viral meningitis. I believe the fatigue is secondary to the viral syndrome and is exacerbated by propranolol, a beta blocker. The headaches have subsided. Therefore I want the patient to discontinue propranolol. He will call me back next week and let me know how he is doing. If the fatigue is better the headaches remain resolved, the only issue that point would be controlling his blood pressure. I want him to check his blood pressure everyday and notify me of the values next week. If still elevated at that time, I will start the patient on an ACE inhibitor.

## 2017-02-10 DIAGNOSIS — F4320 Adjustment disorder, unspecified: Secondary | ICD-10-CM | POA: Diagnosis not present

## 2017-02-12 DIAGNOSIS — F422 Mixed obsessional thoughts and acts: Secondary | ICD-10-CM | POA: Diagnosis not present

## 2017-02-24 DIAGNOSIS — F4320 Adjustment disorder, unspecified: Secondary | ICD-10-CM | POA: Diagnosis not present

## 2017-03-03 DIAGNOSIS — F4320 Adjustment disorder, unspecified: Secondary | ICD-10-CM | POA: Diagnosis not present

## 2017-03-10 DIAGNOSIS — F4320 Adjustment disorder, unspecified: Secondary | ICD-10-CM | POA: Diagnosis not present

## 2017-03-22 IMAGING — CR DG CHEST 2V
2 series · 2 of 2 positions shown · non-contrast
Comparison: PA and lateral chest x-ray October 13, 2014

CLINICAL DATA: Cough and chest congestion for the past 7 weeks.
Recent positive flu test. The patient also reports midchest pain and
tightness. Nonsmoker.

EXAM:
CHEST  2 VIEW

[w chest pa]
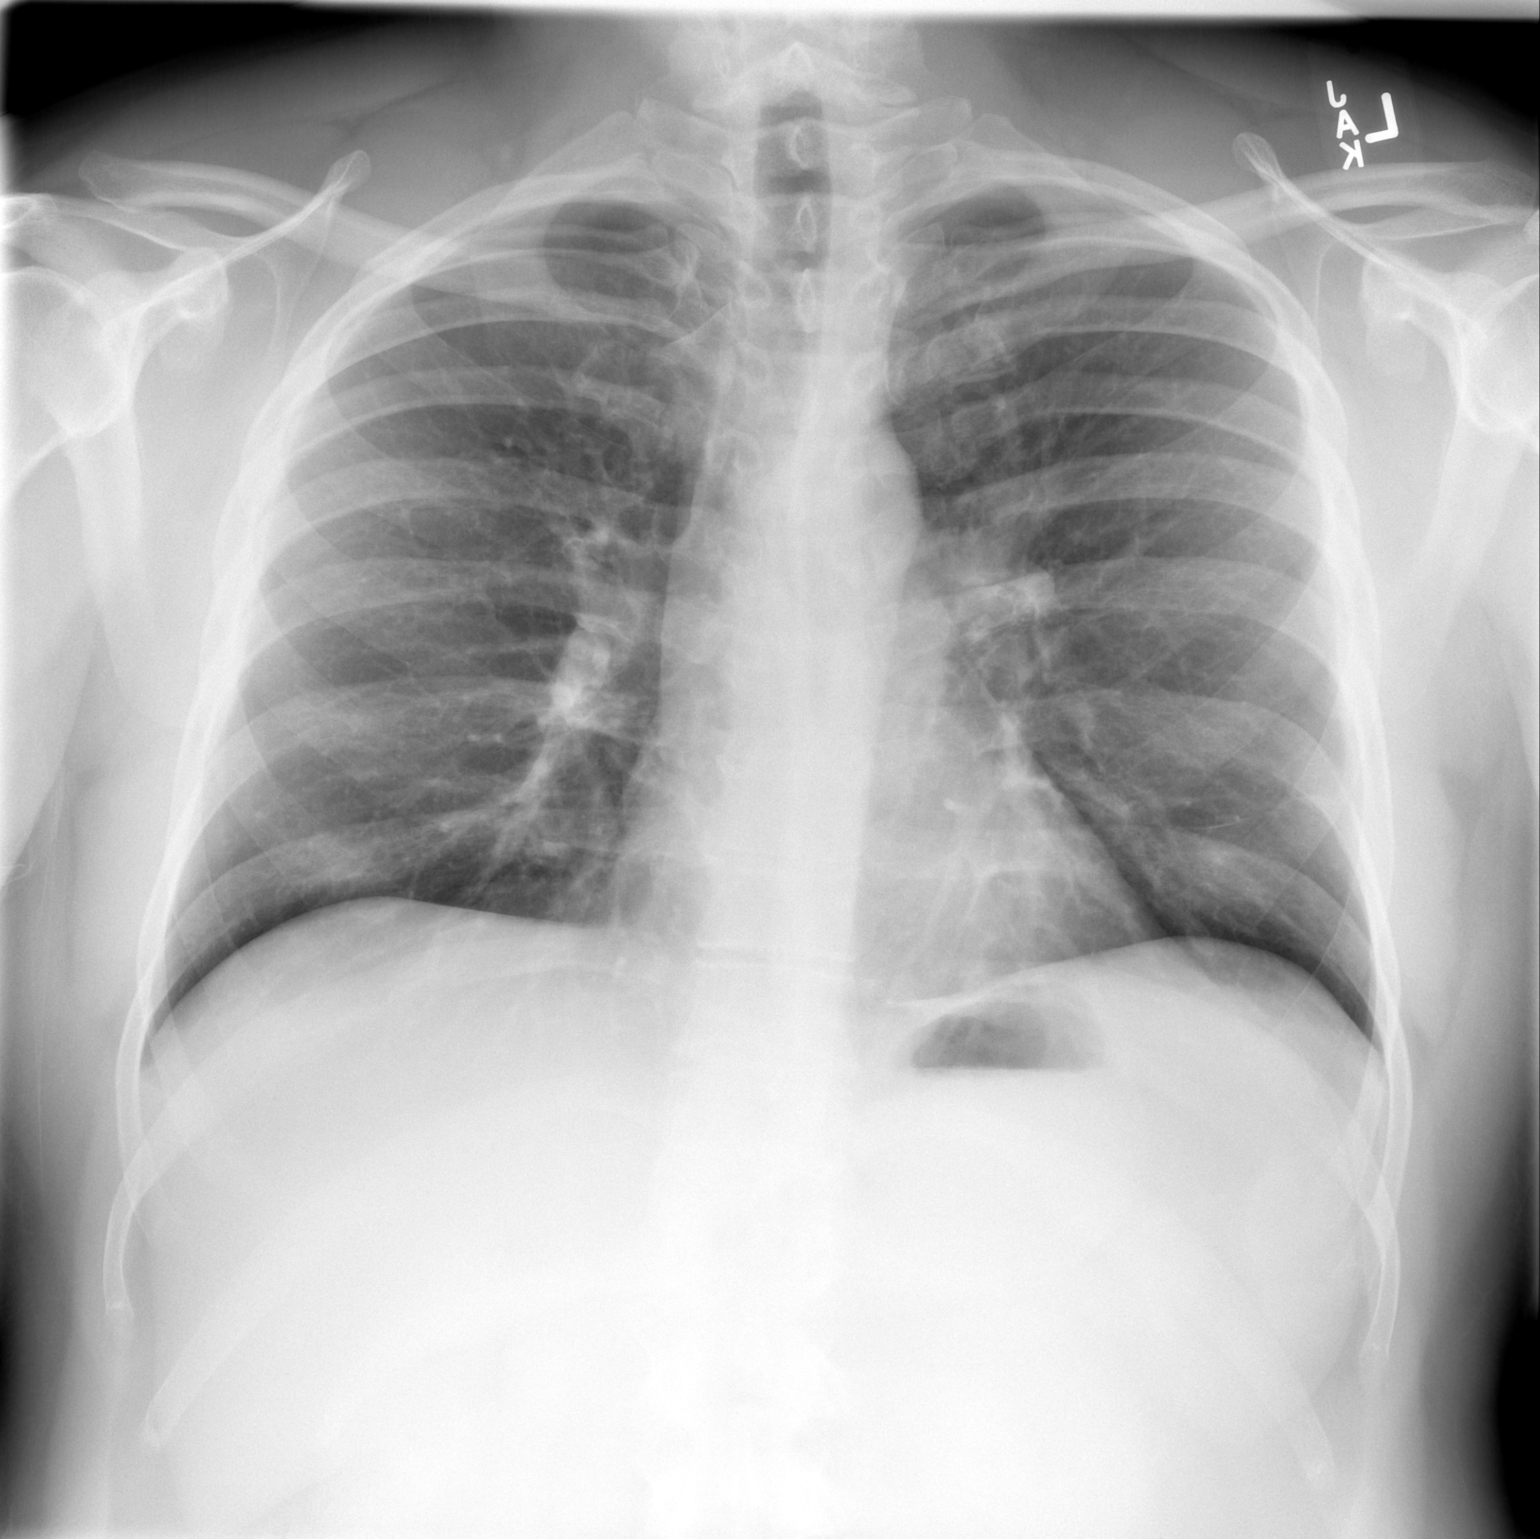

[w chest lat]
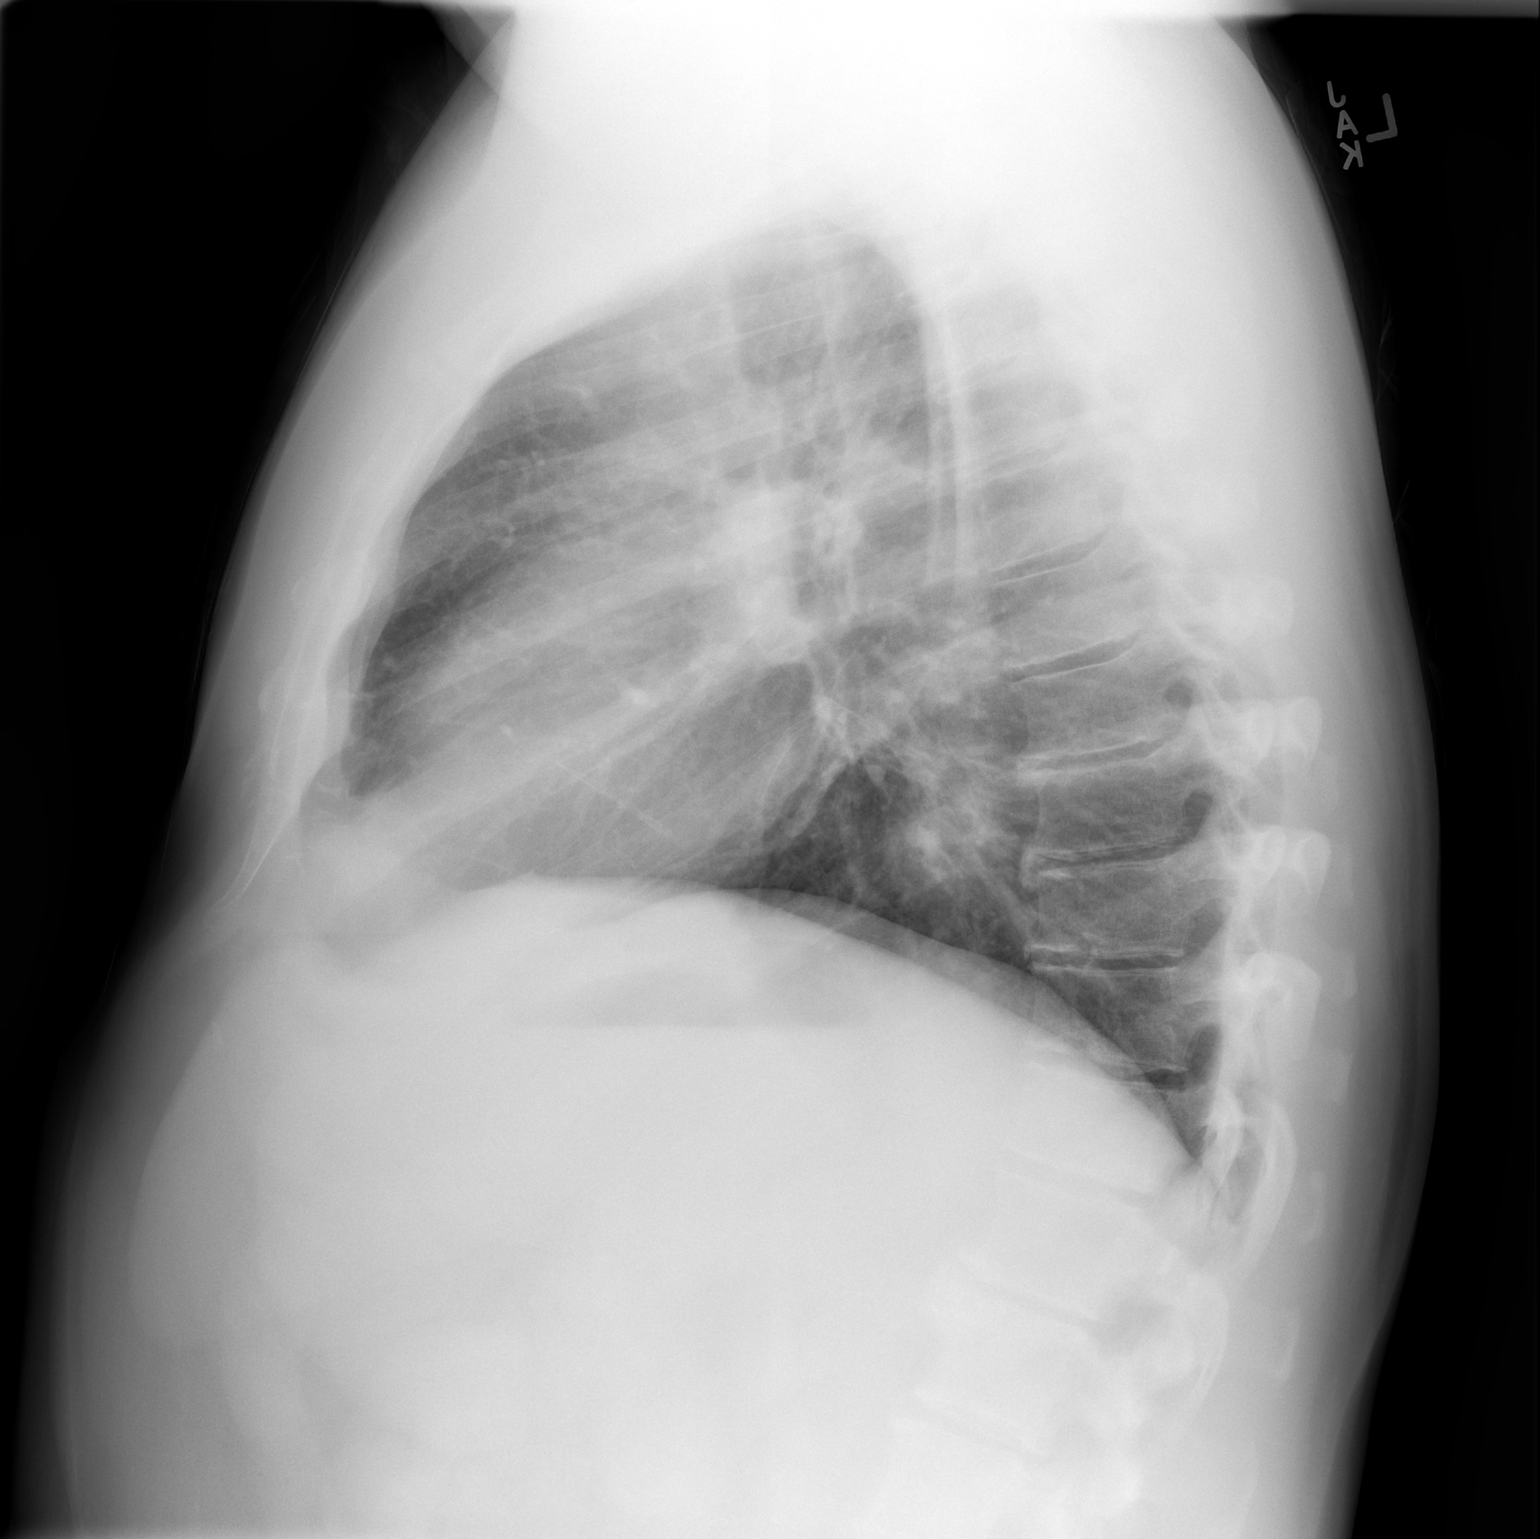

[2 of 2 positions shown; findings below may reference images not displayed]

FINDINGS: The lungs are adequately inflated and clear. The heart and pulmonary
vascularity are normal. The mediastinum is normal in width. There is
no pleural effusion. The trachea is midline. The bony thorax
exhibits no acute abnormality.
IMPRESSION: There is no evidence of pneumonia nor other acute cardiopulmonary
abnormality.

## 2017-04-07 DIAGNOSIS — F4320 Adjustment disorder, unspecified: Secondary | ICD-10-CM | POA: Diagnosis not present

## 2017-04-14 DIAGNOSIS — F4320 Adjustment disorder, unspecified: Secondary | ICD-10-CM | POA: Diagnosis not present

## 2017-04-21 DIAGNOSIS — F4320 Adjustment disorder, unspecified: Secondary | ICD-10-CM | POA: Diagnosis not present

## 2017-05-12 DIAGNOSIS — F4320 Adjustment disorder, unspecified: Secondary | ICD-10-CM | POA: Diagnosis not present

## 2017-05-18 ENCOUNTER — Encounter: Payer: Self-pay | Admitting: Family Medicine

## 2017-05-18 ENCOUNTER — Ambulatory Visit (INDEPENDENT_AMBULATORY_CARE_PROVIDER_SITE_OTHER): Payer: BLUE CROSS/BLUE SHIELD | Admitting: Family Medicine

## 2017-05-18 DIAGNOSIS — E785 Hyperlipidemia, unspecified: Secondary | ICD-10-CM

## 2017-05-18 DIAGNOSIS — E786 Lipoprotein deficiency: Secondary | ICD-10-CM | POA: Diagnosis not present

## 2017-05-18 NOTE — Progress Notes (Signed)
Medical Nutrition Therapy:  Appt start time: 1130 end time:  1230.  Assessment:  Primary concerns today: diet for NASH and elevated TG (>300) as well as low HDL (28), (E78.5. E78.6) Jillyn HiddenGary [facility]) takes Klonopin and Zoloft to manage his OCD.  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  Lives with his wife and 5 children, ages 33 through 1116.  All are boys except his middle child.    Learning Readiness: Change in progress:  Five weeks ago, Jillyn HiddenGary made a decision to eat healthier.  He now eats very few carb's; has never been a big red meat eater.    Usual eating pattern includes 3 meals and 1-2 snacks per day. Frequent foods and beverages include cereal ([employer]) with almond milk, chx, water, Starbucks 6 egg whites, vanilla iced coffee, salad w/ drsng, pecans, feta.  Avoided foods include soda, sweet foods and drinks, bread, pasta, most starches, most red meat, fast food.   Usual physical activity includes none currently, but he does landscaping, including hardscapes, for a living. Usual sleep routine:  12 AM to 6 AM.  Naps in afternoon 1-2 hrs.  Same on weekends.  Feels he has a lot more energy since making dietary changes 5 wks ago, but still not sure he's getting enough sleep.    24-hr recall: (Up at 6 AM) B (8:45 AM)-   Starbucks 6 egg whites, 24 oz vanilla iced coffee Snk ( AM)-   --- L (1:30 PM)-  ?? can't remember ?? Snk (2:30)-  [facility] granola bar D ( PM)-  1 corn on cob, chx brst, sliced cucumbers, 2 tbsp ranch drsng, water Snk ( PM)-  2-3 c [employer] cereal, 1 c almond milk  Typical day? Yes.    Progress Towards Goal(s):  In progress.   Nutritional Diagnosis:  Winston-3.3 Overweight/obesity As related to energy balance.  As evidenced by BMI >30.    Intervention:  Nutrition recommendations.    Handouts given during visit include:  AVS  Demonstrated degree of understanding via:  Teach Back   Barriers to learning/adherence to  lifestyle change: Time constraints for exercise.    Monitoring/Evaluation:  Dietary intake, exercise, and body weight in 4 week(s).

## 2017-05-18 NOTE — Patient Instructions (Addendum)
-   If you choose to use a fish oil, get an omega-3 supplement instead of cod liver oil b/c the latter is too high in vitamin A to be taken safely over time.   - Recommendation: Book about managing OCD derailing thoughts:  You Are not Your Brain  By Ace GinsJ Schwartz and R Gladding.   - Your usual Starbuck's drink:  Ask for the nutrition content next time you are there.  (Just know that if it tastes sweet, there is a reason for that: Sugar.)  - Almond milk has virtually no protein.  This means it is all carboyhdrate, not the best choice for you.  A good alternative is either dairy milk.  The only nondairy milk with a meaningful amt of protein is soy milk.  If your wife would like to talk with me about this choice for the kids, feel free to give her my number:  [phone].    Dietary Recommendations for NonAlcoholic SteatoHepatitis / NonAlcoholic Fatty Liver Disease . Lose weight, if overweight, and maintain weight within normal range.  . Maintain blood sugar within normal range: o Limit sugar sources, including sweet drinks, fruit juice, ice cream, desserts, and candy.  Especially important to limit is fructose, found in abundance in many sweet processed foods and drinks like soda (high-fructose corn syrup) and fruit drinks and fruit juices.   o Limit alcohol intake to no more than 1 drink (women) or 2 drinks (men) per day.  (One drink = 12 oz beer, 5 oz wine, or 1 oz liquor.)   o Choose foods that are "low-glycemic"; those that do not raise blood sugar readily.  This is often considered to be a good diet for diabetes, but it is also a healthy way for all of us to eat.    Diet Recommendations for Blood Glucose Control: Starchy (carb) foods: Bread, rice, pasta, potatoes, corn, cereal, grits, crackers, bagels, muffins, all baked goods.  (Fruits, milk, and yogurt also have carbohydrate, but most of these foods will not spike your blood sugar as most starchy foods will.)  A few fruits do cause high blood sugars;  use small portions of bananas (limit to 1/2 at a time), grapes, watermelon, oranges, and most tropical fruits.    Protein foods: Meat, fish, poultry, eggs, dairy foods, and beans such as pinto and kidney beans (beans also provide carbohydrate).   1. Eat at least REAL 3 meals and 1-2 snacks per day. Never go more than 4-5 hours while awake without eating. Eat breakfast within the first hour of getting up.   2. Limit starchy foods to TWO per meal and ONE per snack. ONE portion of a starchy  food is equal to the following:   - ONE slice of bread (or its equivalent, such as half of a hamburger bun).   - 1/2 cup of a "scoopable" starchy food such as potatoes or rice.   - 15 grams of Total Carbohydrate as shown on food label.  3. Include at every meal: a protein food, a carb food, and vegetables and/or fruit.   - Obtain twice the volume of veg's as protein or carbohydrate foods for both lunch and dinner.   - Fresh or frozen veg's are best.   - Keep frozen veg's on hand for a quick vegetable serving.

## 2017-05-26 DIAGNOSIS — F4320 Adjustment disorder, unspecified: Secondary | ICD-10-CM | POA: Diagnosis not present

## 2017-06-04 DIAGNOSIS — F422 Mixed obsessional thoughts and acts: Secondary | ICD-10-CM | POA: Diagnosis not present

## 2017-06-15 ENCOUNTER — Ambulatory Visit: Payer: BLUE CROSS/BLUE SHIELD | Admitting: Family Medicine

## 2017-06-16 DIAGNOSIS — F4320 Adjustment disorder, unspecified: Secondary | ICD-10-CM | POA: Diagnosis not present

## 2017-06-23 DIAGNOSIS — F4320 Adjustment disorder, unspecified: Secondary | ICD-10-CM | POA: Diagnosis not present

## 2017-06-30 DIAGNOSIS — F4320 Adjustment disorder, unspecified: Secondary | ICD-10-CM | POA: Diagnosis not present

## 2017-07-07 DIAGNOSIS — F4320 Adjustment disorder, unspecified: Secondary | ICD-10-CM | POA: Diagnosis not present

## 2017-07-19 DIAGNOSIS — M545 Low back pain: Secondary | ICD-10-CM | POA: Diagnosis not present

## 2017-07-21 DIAGNOSIS — F4320 Adjustment disorder, unspecified: Secondary | ICD-10-CM | POA: Diagnosis not present

## 2017-07-26 DIAGNOSIS — M545 Low back pain: Secondary | ICD-10-CM | POA: Diagnosis not present

## 2017-07-28 DIAGNOSIS — F4320 Adjustment disorder, unspecified: Secondary | ICD-10-CM | POA: Diagnosis not present

## 2017-08-03 DIAGNOSIS — M545 Low back pain: Secondary | ICD-10-CM | POA: Diagnosis not present

## 2017-08-17 DIAGNOSIS — M545 Low back pain: Secondary | ICD-10-CM | POA: Diagnosis not present

## 2017-08-19 DIAGNOSIS — M5116 Intervertebral disc disorders with radiculopathy, lumbar region: Secondary | ICD-10-CM | POA: Diagnosis not present

## 2017-08-19 DIAGNOSIS — M545 Low back pain: Secondary | ICD-10-CM | POA: Diagnosis not present

## 2017-08-25 DIAGNOSIS — F4320 Adjustment disorder, unspecified: Secondary | ICD-10-CM | POA: Diagnosis not present

## 2017-09-01 DIAGNOSIS — F4320 Adjustment disorder, unspecified: Secondary | ICD-10-CM | POA: Diagnosis not present

## 2017-09-06 DIAGNOSIS — M545 Low back pain: Secondary | ICD-10-CM | POA: Diagnosis not present

## 2017-09-08 DIAGNOSIS — F4320 Adjustment disorder, unspecified: Secondary | ICD-10-CM | POA: Diagnosis not present

## 2017-09-15 DIAGNOSIS — M545 Low back pain: Secondary | ICD-10-CM | POA: Diagnosis not present

## 2017-09-22 DIAGNOSIS — F4320 Adjustment disorder, unspecified: Secondary | ICD-10-CM | POA: Diagnosis not present

## 2017-10-20 DIAGNOSIS — F4320 Adjustment disorder, unspecified: Secondary | ICD-10-CM | POA: Diagnosis not present

## 2017-10-21 DIAGNOSIS — M545 Low back pain: Secondary | ICD-10-CM | POA: Diagnosis not present

## 2017-11-01 DIAGNOSIS — M5116 Intervertebral disc disorders with radiculopathy, lumbar region: Secondary | ICD-10-CM | POA: Diagnosis not present

## 2017-11-03 DIAGNOSIS — F4320 Adjustment disorder, unspecified: Secondary | ICD-10-CM | POA: Diagnosis not present

## 2017-11-23 DIAGNOSIS — F422 Mixed obsessional thoughts and acts: Secondary | ICD-10-CM | POA: Diagnosis not present

## 2017-11-24 DIAGNOSIS — F4320 Adjustment disorder, unspecified: Secondary | ICD-10-CM | POA: Diagnosis not present

## 2017-11-26 DIAGNOSIS — M5116 Intervertebral disc disorders with radiculopathy, lumbar region: Secondary | ICD-10-CM | POA: Insufficient documentation

## 2017-11-26 NOTE — H&P (Signed)
Patient ID: Robert SpainHobert Mcpherson MRN: [id number] DOB/AGE: [birth year] 41 y.o.  Admit date: (Not on file)  Admission Diagnoses:  L4-5 Lumbar Disc Herniation  HPI: pre op H & P  Left L4-5 Discectomy on 12/02/17.  She denies chronic health issues.  Past Medical History: Past Medical History:  Diagnosis Date  . Anxiety   . Dental crown present   . Depression   . Deviated nasal septum 09/2013  . Nasal turbinate hypertrophy 09/2013  . OCD (obsessive compulsive disorder) 04/02/2014    Surgical History: Past Surgical History:  Procedure Laterality Date  . APPENDECTOMY    . NASAL SEPTOPLASTY W/ TURBINOPLASTY Bilateral 10/02/2013   Procedure: BILATERAL NASAL SEPTOPLASTY WITH TURBINATE REDUCTION;  Surgeon: Serena ColonelJefry Rosen, MD;  Location: [facility];  Service: ENT;  Laterality: Bilateral;  . SHOULDER ARTHROSCOPY Right 11/2013  . VASECTOMY  06/02/2013    Family History: Family History  Problem Relation Age of Onset  . Heart disease Maternal Uncle 52       heart attack  . Heart disease Maternal Uncle 4061       CVA    Social History: Social History   Socioeconomic History  . Marital status: Married    Spouse name: Not on file  . Number of children: Not on file  . Years of education: Not on file  . Highest education level: Not on file  Social Needs  . Financial resource strain: Not on file  . Food insecurity - worry: Not on file  . Food insecurity - inability: Not on file  . Transportation needs - medical: Not on file  . Transportation needs - non-medical: Not on file  Occupational History  . Not on file  Tobacco Use  . Smoking status: Never Smoker  . Smokeless tobacco: Never Used  Substance and Sexual Activity  . Alcohol use: No  . Drug use: No  . Sexual activity: Yes  Other Topics Concern  . Not on file  Social History Narrative  . Not on file    Allergies: Cephalexin  Medications: I have reviewed the patient's current medications.  Vital Signs: No data  found.  Radiology: No results found.  Labs: No results for input(s): WBC, RBC, HCT, PLT in the last 72 hours. No results for input(s): NA, K, CL, CO2, BUN, CREATININE, GLUCOSE, CALCIUM in the last 72 hours. No results for input(s): LABPT, INR in the last 72 hours.  Review of Systems: ROS  Physical Exam: Reports current weight is for 251 pounds Alert 3, weakness of breath lungs are clear to auscultation, no chest pain regular rate and rhythm heard on auscultation, abdomen is soft and nontender.  Tenderness to palpation of bilateral paraspinals left worse than right positive straight leg raise on the left no motor deficits dysesthesias of the left lower extremity to the right.  No loss of bowel and bladder control.  Is elicited with flexion extension lateral bending compartments are soft and nontender of his lower extremities.  Assessment and Plan: Risks and benefits of surgery were discussed with the patient. These include: Infection, bleeding, death, stroke, paralysis, ongoing or worse pain, need for additional surgery, leak of spinal fluid, Failure of the battery requiring reoperation. Inability to place the paddle requiring the surgery to be aborted. Migration of the lead, failure to obtain results similar to the trial.  Goal of surgery: Reproduce pain relief obtained during the trial. Improved quality of life.  MRI is significant for disc osteophyte complex at L4-5 disc  desiccation small broad central left central disc protrusion mild to moderate left mild right zone narrowing displacement of the descending left L5 nerve roots and contact of the descending right L5 nerve roots  Has received his LSO.  He understands to bring it to the hospital on the day of surgery.  All questions were elicited and answered   Anette Riedel, PAC for Venita Lick, MD [facility] [phone]

## 2017-11-26 NOTE — Pre-Procedure Instructions (Signed)
Robert Mcpherson  11/26/2017      [id number] - [place], [place] - [address] AT [facility] [address] & [facility] [address] [place] [address] Phone: [phone] Fax: [phone]  [facility] Order - [place] # [id number], [place] - [address] [place] [address] Phone: [phone] Fax: [phone]  [facility] [address] - [place], [place] - [address] [facility] [address] [facility] [place] [address] Phone: [phone] Fax: [phone]  [facility] - [place], [place] - [address] [place] [address] Phone: [phone] Fax: [phone]    Your procedure is scheduled on Thurs. Jan. 31  Report to [facility] Admitting at 8:00 A.M.  Call this number if you have problems the morning of surgery:  [phone]   Remember:  Do not eat food or drink liquids after midnight on Wed. Jan.30   Take these medicines the morning of surgery with A SIP OF WATER : clonazepam (klonopin), sertraline (zoloft)              7 days prior to surgery STOP taking any Aspirin(unless otherwise instructed by your surgeon), Aleve, Naproxen, Ibuprofen, Motrin, Advil, Goody's, BC's, all herbal medications, fish oil, and all vitamins   Do not wear jewelry.  Do not wear lotions, powders, or perfumes, or deodorant.  Do not shave 48 hours prior to surgery.  Men may shave face and neck.  Do not bring valuables to the hospital.  [facility] is not responsible for any belongings or valuables.  Contacts, dentures or bridgework may not be worn into surgery.  Leave your suitcase in the car.  After surgery it may be brought to your room.  For patients admitted to the hospital, discharge time will be determined by your treatment team.  Patients discharged the day of surgery will not be allowed to drive home.   Special instructions:   [place]- Preparing For  Surgery  Before surgery, you can play an important role. Because skin is not sterile, your skin needs to be as free of germs as possible. You can reduce the number of germs on your skin by washing with CHG (chlorahexidine gluconate) Soap before surgery.  CHG is an antiseptic cleaner which kills germs and bonds with the skin to continue killing germs even after washing.  Please do not use if you have an allergy to CHG or antibacterial soaps. If your skin becomes reddened/irritated stop using the CHG.  Do not shave (including legs and underarms) for at least 48 hours prior to first CHG shower. It is OK to shave your face.  Please follow these instructions carefully.   1. Shower the NIGHT BEFORE SURGERY and the MORNING OF SURGERY with CHG.   2. If you chose to wash your hair, wash your hair first as usual with your normal shampoo.  3. After you shampoo, rinse your hair and body thoroughly to remove the shampoo.  4. Use CHG as you would any other liquid soap. You can apply CHG directly to the skin and wash gently with a scrungie or a clean washcloth.   5. Apply the CHG Soap to your body ONLY FROM THE NECK DOWN.  Do not use on open wounds or open sores. Avoid contact with your eyes, ears, mouth and genitals (private parts). Wash Face and genitals (private parts)  with your normal  soap.  6. Wash thoroughly, paying special attention to the area where your surgery will be performed.  7. Thoroughly rinse your body with warm water from the neck down.  8. DO NOT shower/wash with your normal soap after using and rinsing off the CHG Soap.  9. Pat yourself dry with a CLEAN TOWEL.  10. Wear CLEAN PAJAMAS to bed the night before surgery, wear comfortable clothes the morning of surgery  11. Place CLEAN SHEETS on your bed the night of your first shower and DO NOT SLEEP WITH PETS.    Day of Surgery: Do not apply any deodorants/lotions. Please wear clean clothes to the hospital/surgery center.       Please read over the following fact sheets that you were given. Coughing and Deep Breathing and Surgical Site Infection Prevention

## 2017-11-29 ENCOUNTER — Encounter (HOSPITAL_COMMUNITY): Payer: Self-pay

## 2017-11-29 ENCOUNTER — Other Ambulatory Visit: Payer: Self-pay

## 2017-11-29 ENCOUNTER — Encounter (HOSPITAL_COMMUNITY)
Admission: RE | Admit: 2017-11-29 | Discharge: 2017-11-29 | Disposition: A | Discharge status: Home / Self Care | Payer: BLUE CROSS/BLUE SHIELD | Source: Ambulatory Visit | Attending: Orthopedic Surgery | Admitting: Orthopedic Surgery

## 2017-11-29 DIAGNOSIS — Z01812 Encounter for preprocedural laboratory examination: Secondary | ICD-10-CM | POA: Insufficient documentation

## 2017-11-29 DIAGNOSIS — K760 Fatty (change of) liver, not elsewhere classified: Secondary | ICD-10-CM | POA: Diagnosis not present

## 2017-11-29 HISTORY — DX: Pure hyperglyceridemia: E78.1

## 2017-11-29 HISTORY — DX: Other intervertebral disc displacement, lumbar region: M51.26

## 2017-11-29 LAB — SURGICAL PCR SCREEN
MRSA, PCR: NEGATIVE
Staphylococcus aureus: POSITIVE — AB

## 2017-11-29 LAB — COMPREHENSIVE METABOLIC PANEL
ALT: 136 U/L — AB (ref 17–63)
ANION GAP: 9 (ref 5–15)
AST: 52 U/L — ABNORMAL HIGH (ref 15–41)
Albumin: 4.6 g/dL (ref 3.5–5.0)
Alkaline Phosphatase: 51 U/L (ref 38–126)
BUN: 19 mg/dL (ref 6–20)
CALCIUM: 9.9 mg/dL (ref 8.9–10.3)
CHLORIDE: 105 mmol/L (ref 101–111)
CO2: 26 mmol/L (ref 22–32)
CREATININE: 0.94 mg/dL (ref 0.61–1.24)
Glucose, Bld: 83 mg/dL (ref 65–99)
Potassium: 4.5 mmol/L (ref 3.5–5.1)
Sodium: 140 mmol/L (ref 135–145)
Total Bilirubin: 0.9 mg/dL (ref 0.3–1.2)
Total Protein: 7.3 g/dL (ref 6.5–8.1)

## 2017-11-29 LAB — CBC
HCT: 48.7 % (ref 39.0–52.0)
HEMOGLOBIN: 17.2 g/dL — AB (ref 13.0–17.0)
MCH: 31.1 pg (ref 26.0–34.0)
MCHC: 35.3 g/dL (ref 30.0–36.0)
MCV: 88.1 fL (ref 78.0–100.0)
PLATELETS: 226 10*3/uL (ref 150–400)
RBC: 5.53 MIL/uL (ref 4.22–5.81)
RDW: 13.1 % (ref 11.5–15.5)
WBC: 8.2 10*3/uL (ref 4.0–10.5)

## 2017-11-29 NOTE — Progress Notes (Addendum)
  Anesthesia Chart Review: Patient is a 42 year old male scheduled for left L4-5 discectomy on 12/02/17 by Dr. Melina Schools.   History includes never smoker, anxiety, depression, OCD, hypertriglyceridemia, L-disc herniation, appendectomy, shoulder bilateral nasal septoplasty with turbinate reduction 10/02/13, vascectomy. He did not report liver disease history, but had an abnormal ultrasound in 2012 (ordered by Dr. Dennard Schaumann) due to elevated LFTS and was found to have fatty liver. He denied alcohol and illicit drug use.  No reported HTN, although DBP 95 at PAT. (BP was not rechecked.)  PCP is Dr. Jenna Luo.   Meds listed: Klonopin, Zoloft.   BP (!) 132/95   Pulse 87   Temp 37 C   Resp 20   Ht '5\' 9"'$  (1.753 m)   Wt 247 lb 12.8 oz (112.4 kg)   SpO2 96%   BMI 36.59 kg/m   CXR 11/25/16: IMPRESSION: There is no evidence of pneumonia nor other acute cardiopulmonary abnormality.  He had an abdominal U/S on 03/17/11 for evaluation of elevated LFTs. Results showed: IMPRESSION: 1.  No gallstones.  Probable two small gallbladder polyps. 2.  Diffuse fatty infiltration of the liver.  No ductal dilatation.  Preoperative labs noted. CMET done due to previous labs showing mildly elevated LFTs (ALT 68) and mild thrombocytopenia (PLT ~ 130K). Cr 0.94. Glucose 83. Alk Phos 51. AST 52, ALT 136. Total bilirubin 0.9. H/H 17.2/48.7. PLT count normal at 226K.   Reviewed above with anesthesiologist Dr. Hoy Morn. Elevated LFTs could be related to fatty liver disease, but since higher than in the recent past then suggest input from PCP. I have sent Dr. Dennard Schaumann a staff message.  [facility] Stay [facility]/Anesthesiology Phone [phone] 11/29/2017 5:03 PM  Addendum: I have spoken with patient and received a response from Dr. Dennard Schaumann regarding elevated LFTs. Patient denied ETOH or use of Tylenol or NSAIDS. He is aware of fatty liver history and reports he thinks his diet is a big  contributor. Dr. Dennard Schaumann feels like elevated LFTs are due to fatty liver history, and does not think it will interfere with surgery plans. He is referring patient to GI to be evaluated in the near future after surgery. Patient denied jaundice, N/V/D, and abdominal pain. If no acute changes then I anticipate that he can proceed as planned. I have left a voice message for [facility] at Dr. Rolena Infante office with update regarding LFT results and follow-up.  [facility] Short Stay Center/Anesthesiology Phone [phone] 11/30/2017 1:35 PM

## 2017-11-29 NOTE — Progress Notes (Signed)
PCP - Dr. Lynnea FerrierWarren Pickard  Cardiologist - Denies  Chest x-ray - Denies  EKG - Denies  Stress Test - Denies  ECHO - Denies  Cardiac Cath - Denies  Sleep Study - Yes- Negative CPAP - None  LABS- 11/29/17: CBC, CMP   Anesthesia- Yes- Per order-anesthesia made aware  Pt denies having chest pain, sob, or fever at this time. All instructions explained to the pt, with a verbal understanding of the material. Pt agrees to go over the instructions while at home for a better understanding. The opportunity to ask questions was provided.

## 2017-11-30 ENCOUNTER — Other Ambulatory Visit: Payer: Self-pay | Admitting: Family Medicine

## 2017-11-30 ENCOUNTER — Telehealth: Payer: Self-pay | Admitting: Family Medicine

## 2017-11-30 DIAGNOSIS — R945 Abnormal results of liver function studies: Principal | ICD-10-CM

## 2017-11-30 DIAGNOSIS — R7989 Other specified abnormal findings of blood chemistry: Secondary | ICD-10-CM

## 2017-11-30 NOTE — Telephone Encounter (Signed)
-----   Message from Donita BrooksWarren T Pickard, MD sent at 11/30/2017  6:43 AM EST ----- Preop LFT's were elevated.  I suspect fatty liver disease.  Recommend GI consult.  I will order in Epic but can we notify him as to why this is being done.

## 2017-11-30 NOTE — Telephone Encounter (Signed)
Pt aware of below.

## 2017-12-01 MED ORDER — VANCOMYCIN HCL 10 G IV SOLR
1500.0000 mg | INTRAVENOUS | Status: AC
  Administered 2017-12-02: 1500 mg via INTRAVENOUS
  Filled 2017-12-01: qty 1500

## 2017-12-01 NOTE — Anesthesia Preprocedure Evaluation (Addendum)
Anesthesia Evaluation  Patient identified by MRN, date of birth, ID band Patient awake    Reviewed: Allergy & Precautions, H&P , NPO status , Patient's Chart, lab work & pertinent test results  History of Anesthesia Complications Negative for: history of anesthetic complications  Airway Mallampati: I       Dental  (+) Teeth Intact, Loose, Dental Advisory Given, Caps,    Pulmonary neg pulmonary ROS,    breath sounds clear to auscultation       Cardiovascular negative cardio ROS   Rhythm:Regular Rate:Normal     Neuro/Psych PSYCHIATRIC DISORDERS Anxiety Depression negative neurological ROS     GI/Hepatic negative GI ROS, Neg liver ROS,   Endo/Other  negative endocrine ROS  Renal/GU negative Renal ROS     Musculoskeletal negative musculoskeletal ROS (+)   Abdominal   Peds  Hematology negative hematology ROS (+)   Anesthesia Other Findings Upper R cap is very loose  Reproductive/Obstetrics negative OB ROS                             Anesthesia Physical  Anesthesia Plan  ASA: II  Anesthesia Plan: General   Post-op Pain Management:    Induction: Intravenous  PONV Risk Score and Plan: 3 and Dexamethasone, Ondansetron, Midazolam and Treatment may vary due to age or medical condition  Airway Management Planned: Oral ETT  Additional Equipment:   Intra-op Plan:   Post-operative Plan: Extubation in OR  Informed Consent: I have reviewed the patients History and Physical, chart, labs and discussed the procedure including the risks, benefits and alternatives for the proposed anesthesia with the patient or authorized representative who has indicated his/her understanding and acceptance.   Dental advisory given  Plan Discussed with: CRNA  Anesthesia Plan Comments:         Anesthesia Quick Evaluation

## 2017-12-02 ENCOUNTER — Ambulatory Visit (HOSPITAL_COMMUNITY): Payer: BLUE CROSS/BLUE SHIELD | Admitting: Vascular Surgery

## 2017-12-02 ENCOUNTER — Encounter (HOSPITAL_COMMUNITY): Payer: Self-pay | Admitting: Surgery

## 2017-12-02 ENCOUNTER — Observation Stay (HOSPITAL_COMMUNITY)
Admission: RE | Admit: 2017-12-02 | Discharge: 2017-12-03 | Disposition: A | Discharge status: Home / Self Care | Payer: BLUE CROSS/BLUE SHIELD | Source: Ambulatory Visit | Attending: Orthopedic Surgery | Admitting: Orthopedic Surgery

## 2017-12-02 ENCOUNTER — Ambulatory Visit (HOSPITAL_COMMUNITY): Payer: BLUE CROSS/BLUE SHIELD

## 2017-12-02 ENCOUNTER — Ambulatory Visit (HOSPITAL_COMMUNITY): Payer: BLUE CROSS/BLUE SHIELD | Admitting: Anesthesiology

## 2017-12-02 ENCOUNTER — Other Ambulatory Visit: Payer: Self-pay

## 2017-12-02 ENCOUNTER — Encounter (HOSPITAL_COMMUNITY)
Admission: RE | Disposition: A | Discharge status: Home / Self Care | Payer: Self-pay | Source: Ambulatory Visit | Attending: Orthopedic Surgery

## 2017-12-02 DIAGNOSIS — Z419 Encounter for procedure for purposes other than remedying health state, unspecified: Secondary | ICD-10-CM

## 2017-12-02 DIAGNOSIS — M5116 Intervertebral disc disorders with radiculopathy, lumbar region: Secondary | ICD-10-CM | POA: Diagnosis not present

## 2017-12-02 DIAGNOSIS — Z881 Allergy status to other antibiotic agents status: Secondary | ICD-10-CM | POA: Diagnosis not present

## 2017-12-02 DIAGNOSIS — F429 Obsessive-compulsive disorder, unspecified: Secondary | ICD-10-CM | POA: Insufficient documentation

## 2017-12-02 DIAGNOSIS — Z79899 Other long term (current) drug therapy: Secondary | ICD-10-CM | POA: Insufficient documentation

## 2017-12-02 DIAGNOSIS — M2578 Osteophyte, vertebrae: Secondary | ICD-10-CM | POA: Diagnosis not present

## 2017-12-02 DIAGNOSIS — F329 Major depressive disorder, single episode, unspecified: Secondary | ICD-10-CM | POA: Diagnosis not present

## 2017-12-02 DIAGNOSIS — F419 Anxiety disorder, unspecified: Secondary | ICD-10-CM | POA: Diagnosis not present

## 2017-12-02 DIAGNOSIS — Z9889 Other specified postprocedural states: Secondary | ICD-10-CM

## 2017-12-02 DIAGNOSIS — Z981 Arthrodesis status: Secondary | ICD-10-CM | POA: Diagnosis not present

## 2017-12-02 DIAGNOSIS — M5136 Other intervertebral disc degeneration, lumbar region: Secondary | ICD-10-CM | POA: Diagnosis not present

## 2017-12-02 HISTORY — PX: LUMBAR LAMINECTOMY/DECOMPRESSION MICRODISCECTOMY: SHX5026

## 2017-12-02 SURGERY — LUMBAR LAMINECTOMY/DECOMPRESSION MICRODISCECTOMY 1 LEVEL
Anesthesia: General | Site: Back | Laterality: Left

## 2017-12-02 MED ORDER — METHYLPREDNISOLONE ACETATE 40 MG/ML IJ SUSP
INTRAMUSCULAR | Status: DC | PRN
  Administered 2017-12-02: 40 mg

## 2017-12-02 MED ORDER — PHENYLEPHRINE 40 MCG/ML (10ML) SYRINGE FOR IV PUSH (FOR BLOOD PRESSURE SUPPORT)
PREFILLED_SYRINGE | INTRAVENOUS | Status: AC
  Filled 2017-12-02: qty 10

## 2017-12-02 MED ORDER — METHYLPREDNISOLONE ACETATE 40 MG/ML IJ SUSP
INTRAMUSCULAR | Status: AC
  Filled 2017-12-02: qty 1

## 2017-12-02 MED ORDER — ALBUTEROL SULFATE HFA 108 (90 BASE) MCG/ACT IN AERS
INHALATION_SPRAY | RESPIRATORY_TRACT | Status: DC | PRN
  Administered 2017-12-02 (×2): 4 via RESPIRATORY_TRACT

## 2017-12-02 MED ORDER — SUGAMMADEX SODIUM 500 MG/5ML IV SOLN
INTRAVENOUS | Status: DC | PRN
  Administered 2017-12-02: 224.8 mg via INTRAVENOUS

## 2017-12-02 MED ORDER — SERTRALINE HCL 50 MG PO TABS
100.0000 mg | ORAL_TABLET | Freq: Every day | ORAL | Status: DC
  Filled 2017-12-02 (×2): qty 2

## 2017-12-02 MED ORDER — DOCUSATE SODIUM 100 MG PO CAPS
100.0000 mg | ORAL_CAPSULE | Freq: Two times a day (BID) | ORAL | Status: DC
  Administered 2017-12-02 – 2017-12-03 (×3): 100 mg via ORAL
  Filled 2017-12-02 (×3): qty 1

## 2017-12-02 MED ORDER — ACETAMINOPHEN 650 MG RE SUPP: 650.0000 mg | RECTAL | Status: DC | PRN

## 2017-12-02 MED ORDER — MAGNESIUM CITRATE PO SOLN
1.0000 | Freq: Once | ORAL | Status: AC | PRN
  Administered 2017-12-02: 1 via ORAL
  Filled 2017-12-02: qty 296

## 2017-12-02 MED ORDER — LACTATED RINGERS IV SOLN
INTRAVENOUS | Status: DC | PRN
  Administered 2017-12-02 (×2): via INTRAVENOUS

## 2017-12-02 MED ORDER — VANCOMYCIN HCL IN DEXTROSE 1-5 GM/200ML-% IV SOLN
1000.0000 mg | Freq: Once | INTRAVENOUS | Status: AC
  Administered 2017-12-02: 1000 mg via INTRAVENOUS
  Filled 2017-12-02: qty 200

## 2017-12-02 MED ORDER — PROPOFOL 10 MG/ML IV BOLUS
INTRAVENOUS | Status: AC
  Filled 2017-12-02: qty 20

## 2017-12-02 MED ORDER — OXYCODONE-ACETAMINOPHEN 10-325 MG PO TABS: 1.0000 | ORAL_TABLET | ORAL | 0 refills | Status: AC | PRN

## 2017-12-02 MED ORDER — LIDOCAINE 2% (20 MG/ML) 5 ML SYRINGE
INTRAMUSCULAR | Status: DC | PRN
  Administered 2017-12-02: 100 mg via INTRAVENOUS

## 2017-12-02 MED ORDER — MIDAZOLAM HCL 2 MG/2ML IJ SOLN
INTRAMUSCULAR | Status: AC
  Filled 2017-12-02: qty 2

## 2017-12-02 MED ORDER — HYDROMORPHONE HCL 1 MG/ML IJ SOLN
INTRAMUSCULAR | Status: AC
  Filled 2017-12-02: qty 1

## 2017-12-02 MED ORDER — SODIUM CHLORIDE 0.9 % IV SOLN: 250.0000 mL | INTRAVENOUS | Status: DC

## 2017-12-02 MED ORDER — THROMBIN (RECOMBINANT) 20000 UNITS EX SOLR
CUTANEOUS | Status: DC | PRN
  Administered 2017-12-02: 20000 [IU] via TOPICAL

## 2017-12-02 MED ORDER — BUPIVACAINE-EPINEPHRINE (PF) 0.5% -1:200000 IJ SOLN
INTRAMUSCULAR | Status: AC
Start: 2017-12-02 — End: 2017-12-02
  Filled 2017-12-02: qty 30

## 2017-12-02 MED ORDER — MEPERIDINE HCL 25 MG/ML IJ SOLN: 6.2500 mg | INTRAMUSCULAR | Status: DC | PRN

## 2017-12-02 MED ORDER — PROMETHAZINE HCL 25 MG/ML IJ SOLN: 6.2500 mg | INTRAMUSCULAR | Status: DC | PRN

## 2017-12-02 MED ORDER — THROMBIN (RECOMBINANT) 20000 UNITS EX SOLR
CUTANEOUS | Status: AC
  Filled 2017-12-02: qty 20000

## 2017-12-02 MED ORDER — CLONAZEPAM 1 MG PO TABS
1.0000 mg | ORAL_TABLET | Freq: Two times a day (BID) | ORAL | Status: DC | PRN
  Administered 2017-12-02: 1 mg via ORAL
  Filled 2017-12-02: qty 1

## 2017-12-02 MED ORDER — DEXAMETHASONE SODIUM PHOSPHATE 10 MG/ML IJ SOLN
INTRAMUSCULAR | Status: AC
  Filled 2017-12-02: qty 1

## 2017-12-02 MED ORDER — ONDANSETRON HCL 4 MG/2ML IJ SOLN
INTRAMUSCULAR | Status: AC
  Filled 2017-12-02: qty 2

## 2017-12-02 MED ORDER — METHOCARBAMOL 500 MG PO TABS
500.0000 mg | ORAL_TABLET | Freq: Four times a day (QID) | ORAL | Status: DC | PRN
  Administered 2017-12-02 – 2017-12-03 (×4): 500 mg via ORAL
  Filled 2017-12-02 (×3): qty 1

## 2017-12-02 MED ORDER — HEMOSTATIC AGENTS (NO CHARGE) OPTIME
TOPICAL | Status: DC | PRN
  Administered 2017-12-02: 1 via TOPICAL

## 2017-12-02 MED ORDER — ROCURONIUM BROMIDE 10 MG/ML (PF) SYRINGE
PREFILLED_SYRINGE | INTRAVENOUS | Status: AC
  Filled 2017-12-02: qty 5

## 2017-12-02 MED ORDER — EPHEDRINE SULFATE-NACL 50-0.9 MG/10ML-% IV SOSY
PREFILLED_SYRINGE | INTRAVENOUS | Status: DC | PRN
  Administered 2017-12-02: 5 mg via INTRAVENOUS
  Administered 2017-12-02: 10 mg via INTRAVENOUS
  Administered 2017-12-02: 5 mg via INTRAVENOUS

## 2017-12-02 MED ORDER — BUPIVACAINE-EPINEPHRINE (PF) 0.5% -1:200000 IJ SOLN
INTRAMUSCULAR | Status: DC | PRN
  Administered 2017-12-02: 10 mL via PERINEURAL

## 2017-12-02 MED ORDER — PROPOFOL 10 MG/ML IV BOLUS
INTRAVENOUS | Status: DC | PRN
  Administered 2017-12-02: 200 mg via INTRAVENOUS

## 2017-12-02 MED ORDER — PHENYLEPHRINE 40 MCG/ML (10ML) SYRINGE FOR IV PUSH (FOR BLOOD PRESSURE SUPPORT)
PREFILLED_SYRINGE | INTRAVENOUS | Status: DC | PRN
  Administered 2017-12-02: 120 ug via INTRAVENOUS
  Administered 2017-12-02: 40 ug via INTRAVENOUS
  Administered 2017-12-02 (×2): 80 ug via INTRAVENOUS
  Administered 2017-12-02: 200 ug via INTRAVENOUS
  Administered 2017-12-02 (×2): 160 ug via INTRAVENOUS

## 2017-12-02 MED ORDER — DEXAMETHASONE SODIUM PHOSPHATE 10 MG/ML IJ SOLN
INTRAMUSCULAR | Status: DC | PRN
  Administered 2017-12-02: 10 mg via INTRAVENOUS

## 2017-12-02 MED ORDER — OXYCODONE HCL 5 MG PO TABS
10.0000 mg | ORAL_TABLET | ORAL | Status: DC | PRN
  Administered 2017-12-02 – 2017-12-03 (×7): 10 mg via ORAL
  Filled 2017-12-02 (×7): qty 2

## 2017-12-02 MED ORDER — FENTANYL CITRATE (PF) 250 MCG/5ML IJ SOLN
INTRAMUSCULAR | Status: DC | PRN
  Administered 2017-12-02: 50 ug via INTRAVENOUS
  Administered 2017-12-02: 100 ug via INTRAVENOUS
  Administered 2017-12-02 (×2): 50 ug via INTRAVENOUS

## 2017-12-02 MED ORDER — OXYCODONE HCL 5 MG PO TABS
ORAL_TABLET | ORAL | Status: AC
Start: 2017-12-02 — End: 2017-12-02
  Filled 2017-12-02: qty 1

## 2017-12-02 MED ORDER — SODIUM CHLORIDE 0.9% FLUSH: 3.0000 mL | INTRAVENOUS | Status: DC | PRN

## 2017-12-02 MED ORDER — ROCURONIUM BROMIDE 10 MG/ML (PF) SYRINGE
PREFILLED_SYRINGE | INTRAVENOUS | Status: DC | PRN
  Administered 2017-12-02: 10 mg via INTRAVENOUS
  Administered 2017-12-02: 60 mg via INTRAVENOUS
  Administered 2017-12-02: 30 mg via INTRAVENOUS

## 2017-12-02 MED ORDER — EPHEDRINE 5 MG/ML INJ
INTRAVENOUS | Status: AC
Start: 2017-12-02 — End: 2017-12-02
  Filled 2017-12-02: qty 10

## 2017-12-02 MED ORDER — PHENOL 1.4 % MT LIQD: 1.0000 | OROMUCOSAL | Status: DC | PRN

## 2017-12-02 MED ORDER — METHOCARBAMOL 1000 MG/10ML IJ SOLN
500.0000 mg | Freq: Four times a day (QID) | INTRAVENOUS | Status: DC | PRN
  Filled 2017-12-02: qty 5

## 2017-12-02 MED ORDER — LACTATED RINGERS IV SOLN: INTRAVENOUS | Status: DC

## 2017-12-02 MED ORDER — ACETAMINOPHEN 325 MG PO TABS: 650.0000 mg | ORAL_TABLET | ORAL | Status: DC | PRN

## 2017-12-02 MED ORDER — METHOCARBAMOL 500 MG PO TABS
ORAL_TABLET | ORAL | Status: AC
  Filled 2017-12-02: qty 1

## 2017-12-02 MED ORDER — LIDOCAINE 2% (20 MG/ML) 5 ML SYRINGE
INTRAMUSCULAR | Status: AC
  Filled 2017-12-02: qty 5

## 2017-12-02 MED ORDER — FENTANYL CITRATE (PF) 250 MCG/5ML IJ SOLN
INTRAMUSCULAR | Status: AC
  Filled 2017-12-02: qty 5

## 2017-12-02 MED ORDER — ONDANSETRON HCL 4 MG/2ML IJ SOLN
INTRAMUSCULAR | Status: DC | PRN
  Administered 2017-12-02: 4 mg via INTRAVENOUS

## 2017-12-02 MED ORDER — MORPHINE SULFATE (PF) 4 MG/ML IV SOLN
1.0000 mg | INTRAVENOUS | Status: DC | PRN
  Administered 2017-12-02: 1 mg via INTRAVENOUS
  Filled 2017-12-02: qty 1

## 2017-12-02 MED ORDER — MENTHOL 3 MG MT LOZG: 1.0000 | LOZENGE | OROMUCOSAL | Status: DC | PRN

## 2017-12-02 MED ORDER — POLYETHYLENE GLYCOL 3350 17 G PO PACK
17.0000 g | PACK | Freq: Every day | ORAL | Status: DC | PRN
  Administered 2017-12-03: 17 g via ORAL
  Filled 2017-12-02: qty 1

## 2017-12-02 MED ORDER — ONDANSETRON 4 MG PO TBDP: 4.0000 mg | ORAL_TABLET | Freq: Three times a day (TID) | ORAL | 0 refills | Status: DC | PRN

## 2017-12-02 MED ORDER — SUGAMMADEX SODIUM 500 MG/5ML IV SOLN
INTRAVENOUS | Status: AC
  Filled 2017-12-02: qty 5

## 2017-12-02 MED ORDER — OXYCODONE HCL 5 MG PO TABS
5.0000 mg | ORAL_TABLET | ORAL | Status: DC | PRN
  Administered 2017-12-02: 5 mg via ORAL

## 2017-12-02 MED ORDER — MIDAZOLAM HCL 5 MG/5ML IJ SOLN
INTRAMUSCULAR | Status: DC | PRN
  Administered 2017-12-02: 2 mg via INTRAVENOUS

## 2017-12-02 MED ORDER — METHOCARBAMOL 500 MG PO TABS: 500.0000 mg | ORAL_TABLET | Freq: Three times a day (TID) | ORAL | 0 refills | Status: DC

## 2017-12-02 MED ORDER — HYDROMORPHONE HCL 1 MG/ML IJ SOLN
0.2500 mg | INTRAMUSCULAR | Status: DC | PRN
  Administered 2017-12-02 (×2): 0.5 mg via INTRAVENOUS

## 2017-12-02 MED ORDER — SODIUM CHLORIDE 0.9% FLUSH
3.0000 mL | Freq: Two times a day (BID) | INTRAVENOUS | Status: DC
  Administered 2017-12-02: 3 mL via INTRAVENOUS

## 2017-12-02 MED ORDER — ONDANSETRON HCL 4 MG PO TABS: 4.0000 mg | ORAL_TABLET | Freq: Four times a day (QID) | ORAL | Status: DC | PRN

## 2017-12-02 MED ORDER — ONDANSETRON HCL 4 MG/2ML IJ SOLN: 4.0000 mg | Freq: Four times a day (QID) | INTRAMUSCULAR | Status: DC | PRN

## 2017-12-02 SURGICAL SUPPLY — 68 items
BNDG GAUZE ELAST 4 BULKY (GAUZE/BANDAGES/DRESSINGS) ×1 IMPLANT
BUR EGG ELITE 4.0 (BURR) IMPLANT
BUR MATCHSTICK NEURO 3.0 LAGG (BURR) IMPLANT
CANISTER SUCT 3000ML PPV (MISCELLANEOUS) ×2 IMPLANT
CLSR STERI-STRIP ANTIMIC 1/2X4 (GAUZE/BANDAGES/DRESSINGS) ×2 IMPLANT
CORD BI POLAR (MISCELLANEOUS) ×2 IMPLANT
COVER SURGICAL LIGHT HANDLE (MISCELLANEOUS) ×2 IMPLANT
DRAIN CHANNEL 15F RND FF W/TCR (WOUND CARE) IMPLANT
DRAPE POUCH INSTRU U-SHP 10X18 (DRAPES) ×2 IMPLANT
DRAPE SURG 17X23 STRL (DRAPES) ×2 IMPLANT
DRAPE U-SHAPE 47X51 STRL (DRAPES) ×2 IMPLANT
DRSG AQUACEL AG ADV 3.5X 4 (GAUZE/BANDAGES/DRESSINGS) IMPLANT
DRSG AQUACEL AG ADV 3.5X 6 (GAUZE/BANDAGES/DRESSINGS) IMPLANT
DRSG OPSITE POSTOP 4X6 (GAUZE/BANDAGES/DRESSINGS) ×1 IMPLANT
DURAPREP 26ML APPLICATOR (WOUND CARE) ×2 IMPLANT
ELECT BLADE 4.0 EZ CLEAN MEGAD (MISCELLANEOUS)
ELECT CAUTERY BLADE 6.4 (BLADE) ×2 IMPLANT
ELECT PENCIL ROCKER SW 15FT (MISCELLANEOUS) ×2 IMPLANT
ELECT REM PT RETURN 9FT ADLT (ELECTROSURGICAL) ×2
ELECTRODE BLDE 4.0 EZ CLN MEGD (MISCELLANEOUS) IMPLANT
ELECTRODE REM PT RTRN 9FT ADLT (ELECTROSURGICAL) ×1 IMPLANT
EVACUATOR SILICONE 100CC (DRAIN) IMPLANT
GLOVE BIO SURGEON STRL SZ 6.5 (GLOVE) ×2 IMPLANT
GLOVE BIOGEL PI IND STRL 6.5 (GLOVE) ×1 IMPLANT
GLOVE BIOGEL PI IND STRL 8.5 (GLOVE) ×1 IMPLANT
GLOVE BIOGEL PI INDICATOR 6.5 (GLOVE) ×1
GLOVE BIOGEL PI INDICATOR 8.5 (GLOVE) ×1
GLOVE SS BIOGEL STRL SZ 8.5 (GLOVE) ×1 IMPLANT
GLOVE SUPERSENSE BIOGEL SZ 8.5 (GLOVE) ×1
GOWN STRL REUS W/TWL 2XL LVL3 (GOWN DISPOSABLE) ×4 IMPLANT
KIT BASIN OR (CUSTOM PROCEDURE TRAY) ×2 IMPLANT
KIT ROOM TURNOVER OR (KITS) ×2 IMPLANT
NDL 18GX1X1/2 (RX/OR ONLY) (NEEDLE) IMPLANT
NDL SPNL 18GX3.5 QUINCKE PK (NEEDLE) ×2 IMPLANT
NEEDLE 18GX1X1/2 (RX/OR ONLY) (NEEDLE) ×2 IMPLANT
NEEDLE 22X1 1/2 (OR ONLY) (NEEDLE) ×2 IMPLANT
NEEDLE SPNL 18GX3.5 QUINCKE PK (NEEDLE) ×4 IMPLANT
NS IRRIG 1000ML POUR BTL (IV SOLUTION) ×2 IMPLANT
PACK LAMINECTOMY ORTHO (CUSTOM PROCEDURE TRAY) ×2 IMPLANT
PACK UNIVERSAL I (CUSTOM PROCEDURE TRAY) ×2 IMPLANT
PAD ARMBOARD 7.5X6 YLW CONV (MISCELLANEOUS) ×4 IMPLANT
PATTIES SURGICAL .5 X.5 (GAUZE/BANDAGES/DRESSINGS) IMPLANT
PATTIES SURGICAL .5 X1 (DISPOSABLE) ×2 IMPLANT
SPONGE SURGIFOAM ABS GEL 100 (HEMOSTASIS) ×1 IMPLANT
STRIP CLOSURE SKIN 1/2X4 (GAUZE/BANDAGES/DRESSINGS) ×1 IMPLANT
SURGIFLO W/THROMBIN 8M KIT (HEMOSTASIS) ×1 IMPLANT
SUT BONE WAX W31G (SUTURE) ×2 IMPLANT
SUT MON AB 3-0 SH 27 (SUTURE) ×2
SUT MON AB 3-0 SH27 (SUTURE) IMPLANT
SUT STRATAFIX 1PDS 45CM VIOLET (SUTURE) IMPLANT
SUT STRATAFIX MNCRL+ 3-0 PS-2 (SUTURE)
SUT STRATAFIX MONOCRYL 3-0 (SUTURE)
SUT STRATAFIX SPIRAL + 2-0 (SUTURE) IMPLANT
SUT VIC AB 0 CT1 27 (SUTURE)
SUT VIC AB 0 CT1 27XBRD ANBCTR (SUTURE) IMPLANT
SUT VIC AB 1 CT1 18XCR BRD 8 (SUTURE) IMPLANT
SUT VIC AB 1 CT1 8-18 (SUTURE) ×2
SUT VIC AB 1 CTX 36 (SUTURE)
SUT VIC AB 1 CTX36XBRD ANBCTR (SUTURE) IMPLANT
SUT VIC AB 2-0 CT1 18 (SUTURE) ×1 IMPLANT
SUTURE STRATFX MNCRL+ 3-0 PS-2 (SUTURE) IMPLANT
SYR 3ML LL SCALE MARK (SYRINGE) ×1 IMPLANT
SYR BULB IRRIGATION 50ML (SYRINGE) ×2 IMPLANT
SYR CONTROL 10ML LL (SYRINGE) ×2 IMPLANT
TOWEL OR 17X24 6PK STRL BLUE (TOWEL DISPOSABLE) ×2 IMPLANT
TOWEL OR 17X26 10 PK STRL BLUE (TOWEL DISPOSABLE) ×2 IMPLANT
WATER STERILE IRR 1000ML POUR (IV SOLUTION) ×2 IMPLANT
YANKAUER SUCT BULB TIP NO VENT (SUCTIONS) ×2 IMPLANT

## 2017-12-02 NOTE — Plan of Care (Signed)
  Progressing Education: Knowledge of General Education information will improve 12/02/2017 2229 - Progressing by Mel AlmondAguilar, Calel Pisarski D, RN Health Behavior/Discharge Planning: Ability to manage health-related needs will improve 12/02/2017 2229 - Progressing by Cephus RicherAguilar, Arvle Grabe D, RN Clinical Measurements: Ability to maintain clinical measurements within normal limits will improve 12/02/2017 2229 - Progressing by Mel AlmondAguilar, Shauna Bodkins D, RN Will remain free from infection 12/02/2017 2229 - Progressing by Threasa BeardsAguilar, Nabil Bubolz D, RN Diagnostic test results will improve 12/02/2017 2229 - Progressing by Threasa BeardsAguilar, Guida Asman D, RN Respiratory complications will improve 12/02/2017 2229 - Progressing by Mel AlmondAguilar, Joretta Eads D, RN Cardiovascular complication will be avoided 12/02/2017 2229 - Progressing by Threasa BeardsAguilar, Betsey Sossamon D, RN Activity: Risk for activity intolerance will decrease 12/02/2017 2229 - Progressing by Threasa BeardsAguilar, Freedom Lopezperez D, RN Nutrition: Adequate nutrition will be maintained 12/02/2017 2229 - Progressing by Threasa BeardsAguilar, Rut Betterton D, RN Coping: Level of anxiety will decrease 12/02/2017 2229 - Progressing by Mel AlmondAguilar, Karey Suthers D, RN Elimination: Will not experience complications related to bowel motility 12/02/2017 2229 - Progressing by Mel AlmondAguilar, Devlynn Knoff D, RN Will not experience complications related to urinary retention 12/02/2017 2229 - Progressing by Threasa BeardsAguilar, Domnique Vantine D, RN Pain Managment: General experience of comfort will improve 12/02/2017 2229 - Progressing by Threasa BeardsAguilar, Izaia Say D, RN Safety: Ability to remain free from injury will improve 12/02/2017 2229 - Progressing by Cephus RicherAguilar, Jeshawn Melucci D, RN Skin Integrity: Risk for impaired skin integrity will decrease 12/02/2017 2229 - Progressing by Mel AlmondAguilar, Yesennia Hirota D, RN Activity: Ability to avoid complications of mobility impairment will improve 12/02/2017 2229 - Progressing by Cephus RicherAguilar, Naevia Unterreiner D, RN Ability to tolerate increased activity will improve 12/02/2017 2229 - Progressing by Mel AlmondAguilar, Lewayne Pauley D, RN Will remain  free from falls 12/02/2017 2229 - Progressing by Threasa BeardsAguilar, Kevion Fatheree D, RN Bowel/Gastric: Gastrointestinal status for postoperative course will improve 12/02/2017 2229 - Progressing by Mel AlmondAguilar, Whitt Auletta D, RN Education: Ability to verbalize activity precautions or restrictions will improve 12/02/2017 2229 - Progressing by Threasa BeardsAguilar, Brandonlee Navis D, RN Knowledge of the prescribed therapeutic regimen will improve 12/02/2017 2229 - Progressing by Mel AlmondAguilar, Sherae Santino D, RN Understanding of discharge needs will improve 12/02/2017 2229 - Progressing by Mel AlmondAguilar, Draydon Clairmont D, RN Physical Regulation: Ability to maintain clinical measurements within normal limits will improve 12/02/2017 2229 - Progressing by Cephus RicherAguilar, Kymani Laursen D, RN Postoperative complications will be avoided or minimized 12/02/2017 2229 - Progressing by Threasa BeardsAguilar, Aydeen Blume D, RN Diagnostic test results will improve 12/02/2017 2229 - Progressing by Threasa BeardsAguilar, Markesha Hannig D, RN Pain Management: Pain level will decrease 12/02/2017 2229 - Progressing by Mel AlmondAguilar, Aracelia Brinson D, RN Skin Integrity: Signs of wound healing will improve 12/02/2017 2229 - Progressing by Threasa BeardsAguilar, Nefertiti Mohamad D, RN Health Behavior/Discharge Planning: Identification of resources available to assist in meeting health care needs will improve 12/02/2017 2229 - Progressing by Threasa BeardsAguilar, Gauge Winski D, RN Bladder/Genitourinary: Urinary functional status for postoperative course will improve 12/02/2017 2229 - Progressing by Mel AlmondAguilar, Dawnell Bryant D, RN

## 2017-12-02 NOTE — Evaluation (Signed)
Physical Therapy Evaluation and Discharge Patient Details Name: Robert Mcpherson MRN: [id number] DOB: 1976/08/23 Today's Date: 12/02/2017   History of Present Illness  Pt is a 42 y/o male s/p L4-5 diskectomy. PMH includes compression fx of L1.   Clinical Impression  Patient evaluated by Physical Therapy with no further acute PT needs identified. All education has been completed and the patient has no further questions. Pt supervision for gait and stairs this session. Did require min A to stand secondary to pain, however, reports wife can assist at home, as she is a PTA. Pt reports he feels comfortable with mobility and does not need any further acute skilled PT. See below for any follow-up Physical Therapy or equipment needs. PT is signing off. Thank you for this referral. If needs change, please reconsult.       Follow Up Recommendations No PT follow up;Supervision for mobility/OOB    Equipment Recommendations  None recommended by PT    Recommendations for Other Services       Precautions / Restrictions Precautions Precautions: Back Precaution Booklet Issued: Yes (comment) Precaution Comments: Reviewed back precautions with pt. Pt reports his wife is a PTA and had already reviewed prior to surgery.  Required Braces or Orthoses: Spinal Brace Spinal Brace: Lumbar corset;Applied in standing position Restrictions Weight Bearing Restrictions: No      Mobility  Bed Mobility Overal bed mobility: Needs Assistance Bed Mobility: Sidelying to Sit;Rolling Rolling: Supervision Sidelying to sit: Supervision       General bed mobility comments: Supervision for safety. Verbal cues for log roll technique.   Transfers Overall transfer level: Needs assistance Equipment used: 1 person hand held assist Transfers: Sit to/from Stand Sit to Stand: Min assist         General transfer comment: Min A for lift assist secondary to increased pain. Verbal cues to power through LEs. Pt reports wife  can help assist at home with transfers if needed.   Ambulation/Gait Ambulation/Gait assistance: Supervision Ambulation Distance (Feet): 300 Feet Assistive device: None Gait Pattern/deviations: Step-through pattern;Decreased stride length Gait velocity: Decreased  Gait velocity interpretation: Below normal speed for age/gender General Gait Details: Slow, very guarded gait secondary to pain. No LOB noted during gait. Educated about generalized walking program to perform at home.   Stairs Stairs: Yes Stairs assistance: Min guard;Supervision Stair Management: One rail Left;Step to pattern;Forwards Number of Stairs: 10(X2) General stair comments: Pt ambulated 10 steps X 2. Initially required min guard for safety during first attempt, however, progressed to supervision. Educated about use of step to pattern to increase safety with stair management. Pt reports feeling comfortable with stair management.   Wheelchair Mobility    Modified Rankin (Stroke Patients Only)       Balance Overall balance assessment: Needs assistance Sitting-balance support: No upper extremity supported;Feet supported Sitting balance-Leahy Scale: Good     Standing balance support: No upper extremity supported;During functional activity Standing balance-Leahy Scale: Fair                               Pertinent Vitals/Pain Pain Assessment: Faces Faces Pain Scale: Hurts even more Pain Location: back  Pain Descriptors / Indicators: Aching;Operative site guarding Pain Intervention(s): Limited activity within patient's tolerance;Patient requesting pain meds-RN notified;Monitored during session;Repositioned    Home Living Family/patient expects to be discharged to:: Private residence Living Arrangements: Spouse/significant other Available Help at Discharge: Family;Available 24 hours/day Type of Home: House Home Access: Stairs  to enter Entrance Stairs-Rails: Left Entrance Stairs-Number of Steps:  6 Home Layout: Two level;Able to live on main level with bedroom/bathroom Home Equipment: Shower seat - built in      Prior Function Level of Independence: Independent               Hand Dominance        Extremity/Trunk Assessment   Upper Extremity Assessment Upper Extremity Assessment: Defer to OT evaluation    Lower Extremity Assessment Lower Extremity Assessment: Overall WFL for tasks assessed    Cervical / Trunk Assessment Cervical / Trunk Assessment: Other exceptions Cervical / Trunk Exceptions: s/p lumbar surgery   Communication   Communication: No difficulties  Cognition Arousal/Alertness: Awake/alert Behavior During Therapy: WFL for tasks assessed/performed Overall Cognitive Status: Within Functional Limits for tasks assessed                                        General Comments General comments (skin integrity, edema, etc.): Pt reports no further need for skilled PT, reports he feels comfortable with mobility at home.     Exercises     Assessment/Plan    PT Assessment Patent does not need any further PT services  PT Problem List         PT Treatment Interventions      PT Goals (Current goals can be found in the Care Plan section)  Acute Rehab PT Goals Patient Stated Goal: to go home  PT Goal Formulation: With patient Time For Goal Achievement: 12/02/17 Potential to Achieve Goals: Good    Frequency     Barriers to discharge        Co-evaluation               AM-PAC PT "6 Clicks" Daily Activity  Outcome Measure Difficulty turning over in bed (including adjusting bedclothes, sheets and blankets)?: A Little Difficulty moving from lying on back to sitting on the side of the bed? : A Little Difficulty sitting down on and standing up from a chair with arms (e.g., wheelchair, bedside commode, etc,.)?: Unable Help needed moving to and from a bed to chair (including a wheelchair)?: None Help needed walking in hospital  room?: None Help needed climbing 3-5 steps with a railing? : None 6 Click Score: 19    End of Session Equipment Utilized During Treatment: Gait belt;Back brace Activity Tolerance: Patient tolerated treatment well Patient left: in chair;with call bell/phone within reach;with nursing/sitter in room Nurse Communication: Mobility status;Patient requests pain meds PT Visit Diagnosis: Other abnormalities of gait and mobility (R26.89);Pain Pain - part of body: (back )    Time: [id number] PT Time Calculation (min) (ACUTE ONLY): 27 min   Charges:   PT Evaluation $PT Eval Low Complexity: 1 Low PT Treatments $Gait Training: 8-22 mins   PT G Codes:        Gladys DammeBrittany Damen [place], PT, DPT  Acute Rehabilitation Services  Pager: [phone]   Lehman PromBrittany S Yilia Sacca 12/02/2017, 1:49 PM

## 2017-12-02 NOTE — Op Note (Signed)
Operative report.  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  His quality of life is continued to deteriorate despite appropriate conservative management.  As a result we have elected to proceed with surgery.  All appropriate risks benefits and alternatives were discussed with the patient and consent was obtained.  Operative report.  Patient was brought the operating room placed upon the operating table.  After the induction of general anesthesia and endotracheally patient teds SCDs were applied and the patient was turned prone onto the Wilson frame.  All bony prominences well-padded and the back was prepped and draped in a standard fashion.  Timeout was taken to confirm patient procedure and all other important data.  2 needles were placed in the back and an x-ray was taken for incision localization.  I marked out the incision site and infiltrated with half percent Marcaine with epinephrine.  Midline incision was made centered over the 5 disc space.  Sharp dissection was carried out down to the deep fascia.  The fascia was incised and using Bovie and Cobb I stripped the paraspinal muscles to expose the L4 and L5 spinous process and lamina and facet complex on the left side.  A Penfield 4 was placed underneath the L5 for lamina and an x-ray was taken.  I again confirmed that I was at the L4-5 level.  A Taylor retractor was placed on the lateral side of the facet complex and held in place after visualization.  A laminotomy was performed with a 3 mm Kerrison rongeur and the bony edges were sealed with bone wax for hemostasis.  I then released the  ligamentum flavum from the leading edge of the L5 lamina and gently dissected through to create a plane between the thecal sac and the lamina.  2 mm Kerrison rongeur was used to remove the ligamentum flavum and exposed the underlying thecal sac.  I then gently continued my decompression into the lateral recess performing a medial facetectomy.  This allowed me to visualize the L5 nerve root.  On initial inspection there was lateral recess stenosis was difficult to pass my nerve hook into the L5 foramen.  Given this I continued my medial facetectomy until I could palpate and visualize the medial border of the L5 pedicle.  I then continued into the foramen decompressing the nerve root.  This allowed me to visualize the L4-5 disc space.  Neuro patties were placed into the wound to aid in visualization and protection of the exiting and traversing nerve root.  D'Errico nerve root retractor was placed medially and I incised the L4-5 disc space.  Using a nerve hook I mobilized the disc fragment into the annulotomy site and removed with micropituitary rongeurs.  This was more consistent with a hard disc osteophyte which I was able to remove using pituitary rongeurs.  The disc herniation itself was felt to be more of the sub-annular disc herniation.  Once I performed an adequate discectomy medial facetectomy and foraminotomy the L5 nerve root was now completely mobile.  It was no longer under tension.  I could freely pass my Woodson elevator anteriorly in the lateral recess circumferentially at the disc space and inferiorly along the L5 nerve root path  and out to the L5 foramen.  At this time I used bipolar cautery to obtain hemostasis and maintain it with FloSeal.  I irrigated the wound copiously with normal saline and ensured that hemostasis and checked one last time that an adequate discectomy and decompression.  Once confirmed I did place 1 cc of Depo-Medrol (40 mg) over the exiting nerve root and then a thrombin-soaked  Gelfoam patties over the laminotomy site retractors were removed and I closed the wound in a layered fashion with interrupted #1 Vicryl suture, 2-0 Vicryl suture, and 3-0 Monocryl.  Steri-Strips dry dressings were applied patient was ultimately extubated transferred the PACU that incident.  The end of the case all needle sponge counts were correct.  There were no adverse intraoperative events.

## 2017-12-02 NOTE — H&P (Signed)
History of Present Illness  The patient is a 42 year old male who presents with back and leg pain.  The patient reports no help with  ESI 08-19-17. The patient reports pain (bilat buttocks and legs), decreased range of motion, numbness and tingling in leg(s) (left leg) and spasms involving the low back which began 6 month(s) ago in association with an established activity (picked up a trailer). The patient describes their pain as rating it as 6 / 10 on a ten-point analog pain scale (with ROM) and feels as if they are unchanged. Symptoms are exacerbated by lifting, bending and getting up from a sitting position. Associated symptoms include leg pain and numbness and tingling in legs, while associated symptoms do not include loss of bladder control or loss of bowel control. Current treatment includes activity modification. Past treatment has included activity modification and physical therapy (had to stop due to pain).   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 never smoker Alcohol use  current drinker; drinks beer; only occasionally per week Children  4 Current work status  working full time Drug/Alcohol Rehab (Currently)  no Drug/Alcohol Rehab (Previously)  no Exercise  Exercises daily; does other Illicit drug use  no Living situation  live with spouse Marital status  married Number of flights of stairs before winded  greater than 5 Pain Contract  no  Medication History  Brintellix (10MG  Tablet, Oral) Active. ClonazePAM (0.5MG  Tablet, Oral) Active. Zoloft (Oral) Specific  strength unknown - Active. Medications Reconciled  Physical Exam Alert 3, weakness of breath lungs are clear to auscultation, no chest pain regular rate and rhythm heard on auscultation, abdomen is soft and nontender. Tenderness to palpation of bilateral paraspinals left worse than right positive straight leg raise on the left no motor deficits dysesthesias of the left lower extremity to the right. No loss of bowel and bladder control. Is elicited with flexion extension lateral bending compartments are soft and nontender of his lower extremities.  MRI is significant for disc osteophyte complex at L4-5 disc desiccation small broad central left central disc protrusion mild to moderate left mild right zone narrowing displacement of the descending left L5 nerve roots and contact of the descending right L5 nerve roots  Assessment & Plan Risks and benefits of surgery were discussed with the patient. These include: Infection, bleeding, death, stroke, paralysis, ongoing or worse pain, need for additional surgery, leak of spinal fluid, Failure of the battery requiring reoperation. Inability to place the paddle requiring the surgery to be aborted. Migration of the lead, failure to obtain results similar to the trial.  Goal of surgery: Reproduce pain relief obtained during the trial. Improved quality of life.  Patient has ongoing radicular left leg pain in the L5 distribution.  There is a hard disc osteophyte complex causing displacement of the descending left L5 nerve root.  As a result of the failure of conservative management, and the duration of his symptoms he is elected to move forward with surgery.  Patient's primary problem is a neuropathic leg pain and as a result we are moving forward with a lumbar decompression discectomy.  This should allow adequate space to be created  for the nerve so that the neuropathic leg pain has the best chance of resolving.  All of his and his wife's questions were  addressed.

## 2017-12-02 NOTE — Discharge Instructions (Signed)
Lumbar Diskectomy, Care After °Refer to this sheet in the next few weeks. These instructions provide you with information about caring for yourself after your procedure. Your health care provider may also give you more specific instructions. Your treatment has been planned according to current medical practices, but problems sometimes occur. Call your health care provider if you have any problems or questions after your procedure. °What can I expect after the procedure? °After the procedure, it is common to have: °· Pain. °· Numbness. °· Weakness. ° °Follow these instructions at home: °Medicines °· Take medicines only as directed by your health care provider. °· If you were prescribed an antibiotic medicine, finish all of it even if you start to feel better. °Incision care °· There are many different ways to close and cover an incision, including stitches (sutures), skin glue, and adhesive strips. Follow your health care provider's instructions about: °? Incision care. °? Bandage (dressing) changes and removal. °? Incision closure removal. °· Check your incision area every day for signs of infection. If you cannot see your incision, have someone check it for you. Watch for: °? Redness, swelling, or pain. °? Fluid, blood, or pus. °Activity °· Avoid sitting for longer than 20 minutes at a time or as directed by your health care provider. °· Do not climb stairs more than once each day until your health care provider approves. °· Do not bend at your waist. To pick things up, bend your knees. °· Do not lift anything that is heavier than 10 lb (4.5 kg) or as directed by your health care provider. °· Do not drive a car until your health care provider approves. °· Ask your health care provider when you may return to your normal activities, such as playing sports and going back to work. °· Work with your physical therapist to learn safe movement and exercises to help healing. Do these exercises as directed. °· Take short  walks often. °General instructions °· Do not use any tobacco products, including cigarettes, chewing tobacco, or electronic cigarettes. If you need help quitting, ask your health care provider. °· Follow your health care provider’s instructions about bathing. Do not take baths, shower, swim, or use a hot tub until your health care provider approves. °· Wear your back brace as directed by your health care provider. °· To prevent constipation: °? Drink enough fluid to keep your urine clear or pale yellow. °? Eat plenty of fruits, vegetables, and whole grains. °· Keep all follow-up visits as directed by your health care provider. This is important. This includes any follow-up visits with your physical therapist. °Contact a health care provider if: °· You have a fever. °· You have redness, swelling, or pain in your incision area. °· Your pain is not controlled with medicine. °· You have pain, numbness, or weakness that lasts longer than three weeks after surgery. °· You become constipated. °Get help right away if: °· You have fluid, blood, or pus coming from your incision. °· You have increasing pain, numbness, or weakness. °· You lose control of when you urinate or have a bowel movement (incontinence). °· You have chest pain. °· You have trouble breathing. °This information is not intended to replace advice given to you by your health care provider. Make sure you discuss any questions you have with your health care provider. °Document Released: 09/23/2004 Document Revised: 03/26/2016 Document Reviewed: 06/13/2014 °Elsevier Interactive Patient Education © 2018 [employer] ° °

## 2017-12-02 NOTE — Brief Op Note (Signed)
12/02/2017  9:05 AM  PATIENT:  Damain G Khurana  42 y.o. 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  POST-OPERATIVE DIAGNOSIS:  left Lumbar four-five herniated nucleic pulposis   PROCEDURE:  Procedure(s) with comments: Left Lumbar 4-5 disectomy (Left) - 2 hrs  SURGEON:  Surgeon(s) and Role:    Venita Lick* Kimari Lienhard, MD - Primary  PHYSICIAN ASSISTANT:   ASSISTANTS: Carmen Mayo   ANESTHESIA:   general  EBL:  75 mL   BLOOD ADMINISTERED:none  DRAINS: none   LOCAL MEDICATIONS USED:  MARCAINE    and OTHER depomedrol  SPECIMEN:  No Specimen  DISPOSITION OF SPECIMEN:  N/A  COUNTS:  YES  TOURNIQUET:  * No tourniquets in log *  DICTATION: .Dragon Dictation  PLAN OF CARE: Admit for overnight observation  PATIENT DISPOSITION:  PACU - hemodynamically stable.

## 2017-12-02 NOTE — Progress Notes (Signed)
Pharmacy Antibiotic Note  Robert Mcpherson is a 42 y.o. male admitted on 12/02/2017 for planned spinal surgery.  Pharmacy has been consulted for Vancomycin dosing post-op for surgical prophylaxis.   Vancomycin 1500 mg IV x 1 given pre-op at 0705, per RN report - no drain is in place. From 1/28: SCr 0.94, nCrCl~100 ml/min.  Plan: 1. Vancomycin 1g IV x 1 at 1930 today 2. Pharmacy will sign off as no further doses are expected at this time.   Height: 5\' 9"  (175.3 cm) Weight: 247 lb (112 kg) IBW/kg (Calculated) : 70.7  Temp (24hrs), Avg:97.8 F (36.6 C), Min:97.5 F (36.4 C), Max:98 F (36.7 C)  Recent Labs  Lab 11/29/17 1156  WBC 8.2  CREATININE 0.94    Estimated Creatinine Clearance: 128.8 mL/min (by C-G formula based on SCr of 0.94 mg/dL).    Allergies  Allergen Reactions  . Cephalexin Hives    Thank you for allowing pharmacy to be a part of this patient's care.  Rolley SimsMartin, Myasia Sinatra Ann 12/02/2017 11:37 AM

## 2017-12-02 NOTE — Anesthesia Procedure Notes (Signed)
Procedure Name: Intubation Date/Time: 12/02/2017 7:40 AM Performed by: Freddie Breech, CRNA Pre-anesthesia Checklist: Patient identified, Emergency Drugs available, Suction available and Patient being monitored Patient Re-evaluated:Patient Re-evaluated prior to induction Oxygen Delivery Method: Circle System Utilized Preoxygenation: Pre-oxygenation with 100% oxygen Induction Type: IV induction Ventilation: Mask ventilation without difficulty Laryngoscope Size: Mac and 4 Grade View: Grade II Tube type: Oral Tube size: 7.5 mm Number of attempts: 1 Airway Equipment and Method: Stylet and Oral airway Placement Confirmation: ETT inserted through vocal cords under direct vision,  positive ETCO2 and breath sounds checked- equal and bilateral Secured at: 23 cm Tube secured with: Tape Dental Injury: Teeth and Oropharynx as per pre-operative assessment

## 2017-12-02 NOTE — Transfer of Care (Signed)
Immediate Anesthesia Transfer of Care Note  Patient: Robert Mcpherson  Procedure(s) Performed: Left Lumbar 4-5 disectomy (Left Back)  Patient Location: PACU  Anesthesia Type:General  Level of Consciousness: drowsy and patient cooperative  Airway & Oxygen Therapy: Patient Spontanous Breathing and Patient connected to face mask oxygen  Post-op Assessment: Report given to RN and Post -op Vital signs reviewed and stable  Post vital signs: Reviewed and stable  Last Vitals:  Vitals:   12/02/17 0714 12/02/17 0934  BP: (!) 133/92 (P) 108/80  Pulse: 80 (P) 77  Resp: 18 (!) (P) 9  Temp: 36.6 C (P) 36.7 C  SpO2: 97% (P) 98%    Last Pain:  Vitals:   12/02/17 0714  TempSrc: Oral      Patients Stated Pain Goal: 3 (12/02/17 [id number])  Complications: No apparent anesthesia complications

## 2017-12-02 NOTE — Progress Notes (Signed)
Orthopedic Tech Progress Note Patient Details:  Robert Mcpherson 11-03-1975 [id number] Patient already has brace. Patient ID: Robert Mcpherson, male   DOB: 11-03-1975, 42 y.o.   MRN: [id number]   Robert Mcpherson, Robert Mcpherson 12/02/2017, 12:04 PM

## 2017-12-02 NOTE — Anesthesia Postprocedure Evaluation (Signed)
Anesthesia Post Note  Patient: Robert Mcpherson  Procedure(s) Performed: Left Lumbar 4-5 disectomy (Left Back)     Patient location during evaluation: PACU Anesthesia Type: General Level of consciousness: sedated and patient cooperative Pain management: pain level controlled Vital Signs Assessment: post-procedure vital signs reviewed and stable Respiratory status: spontaneous breathing Cardiovascular status: stable Anesthetic complications: no    Last Vitals:  Vitals:   12/02/17 0948 12/02/17 1006  BP: 125/74   Pulse: 70 95  Resp: 12 13  Temp:    SpO2: 99% 100%    Last Pain:  Vitals:   12/02/17 1020  TempSrc:   PainSc: 6                  Lewie LoronJohn Dewitt Judice

## 2017-12-03 ENCOUNTER — Encounter (HOSPITAL_COMMUNITY): Payer: Self-pay | Admitting: Orthopedic Surgery

## 2017-12-03 DIAGNOSIS — Z79899 Other long term (current) drug therapy: Secondary | ICD-10-CM | POA: Diagnosis not present

## 2017-12-03 DIAGNOSIS — F329 Major depressive disorder, single episode, unspecified: Secondary | ICD-10-CM | POA: Diagnosis not present

## 2017-12-03 DIAGNOSIS — M5116 Intervertebral disc disorders with radiculopathy, lumbar region: Secondary | ICD-10-CM | POA: Diagnosis not present

## 2017-12-03 DIAGNOSIS — Z881 Allergy status to other antibiotic agents status: Secondary | ICD-10-CM | POA: Diagnosis not present

## 2017-12-03 DIAGNOSIS — F429 Obsessive-compulsive disorder, unspecified: Secondary | ICD-10-CM | POA: Diagnosis not present

## 2017-12-03 DIAGNOSIS — M2578 Osteophyte, vertebrae: Secondary | ICD-10-CM | POA: Diagnosis not present

## 2017-12-03 DIAGNOSIS — F419 Anxiety disorder, unspecified: Secondary | ICD-10-CM | POA: Diagnosis not present

## 2017-12-03 NOTE — Evaluation (Signed)
Occupational Therapy Evaluation Patient Details Name: Robert Mcpherson MRN: [id number] DOB: [birth year] Today's Date: 12/03/2017    History of Present Illness Pt is a 42 y/o male s/p L4-5 diskectomy. PMH includes compression fx of L1.    Clinical Impression   Pt reports he was independent with ADL PTA. Currently pt mod I with ADL and functional mobility. All back, safety, and ADL education completed with pt. Pt planning to d/c home with 24/7 supervision from family. No further acute OT needs identified; signing off at this time. Please re-consult if needs change. Thank you for this referral.    Follow Up Recommendations  No OT follow up;Supervision - Intermittent    Equipment Recommendations  None recommended by OT    Recommendations for Other Services       Precautions / Restrictions Precautions Precautions: Back Precaution Booklet Issued: No Precaution Comments: Reviewed precautions with pt Required Braces or Orthoses: Spinal Brace Spinal Brace: Lumbar corset;Applied in standing position Restrictions Weight Bearing Restrictions: No      Mobility Bed Mobility Overal bed mobility: Modified Independent             General bed mobility comments: HOB flat, good log roll  Transfers Overall transfer level: Modified independent Equipment used: None                  Balance Overall balance assessment: No apparent balance deficits (not formally assessed)                                         ADL either performed or assessed with clinical judgement   ADL Overall ADL's : Needs assistance/impaired Eating/Feeding: Independent;Sitting   Grooming: Modified independent;Standing Grooming Details (indicate cue type and reason): Educated on use of 2 cups for oral care Upper Body Bathing: Modified independent;Sitting   Lower Body Bathing: Supervison/ safety;Sit to/from stand   Upper Body Dressing : Modified independent;Sitting Upper Body Dressing  Details (indicate cue type and reason): for donning shirt and brace Lower Body Dressing: Supervision/safety;Sit to/from stand Lower Body Dressing Details (indicate cue type and reason): Educated on compensatory strategies for LB ADL; pt able to return demo Toilet Transfer: Modified Independent;Ambulation;Regular Toilet     Toileting - Clothing Manipulation Details (indicate cue type and reason): Educated pt on proper technique for peri care without twisting and use of wet wipes Tub/ Shower Transfer: Ambulation;Modified independent;Walk-in shower   Functional mobility during ADLs: Modified independent General ADL Comments: Educated pt on maintaining back precautions during functional activities, keeping frequently used items at coutner top height, log roll for bed mobility, frequent mobility throughout the day upon return home     Vision         Perception     Praxis      Pertinent Vitals/Pain Pain Assessment: Faces Faces Pain Scale: Hurts a little bit Pain Location: back Pain Descriptors / Indicators: Discomfort;Sore Pain Intervention(s): Monitored during session;Repositioned     Hand Dominance     Extremity/Trunk Assessment Upper Extremity Assessment Upper Extremity Assessment: Overall WFL for tasks assessed   Lower Extremity Assessment Lower Extremity Assessment: Defer to PT evaluation   Cervical / Trunk Assessment Cervical / Trunk Assessment: Other exceptions Cervical / Trunk Exceptions: s/p lumbar surgery    Communication Communication Communication: No difficulties   Cognition Arousal/Alertness: Awake/alert Behavior During Therapy: WFL for tasks assessed/performed Overall Cognitive Status: Within Functional Limits for tasks assessed  General Comments       Exercises     Shoulder Instructions      Home Living Family/patient expects to be discharged to:: Private residence Living Arrangements:  Spouse/significant other Available Help at Discharge: Family;Available 24 hours/day Type of Home: House Home Access: Stairs to enter Entergy CorporationEntrance Stairs-Number of Steps: 6 Entrance Stairs-Rails: Left Home Layout: Two level;Able to live on main level with bedroom/bathroom     Bathroom Shower/Tub: Producer, television/film/videoWalk-in shower   Bathroom Toilet: Handicapped height     Home Equipment: Shower seat - built in          Prior Functioning/Environment Level of Independence: Independent                 OT Problem List:        OT Treatment/Interventions:      OT Goals(Current goals can be found in the care plan section) Acute Rehab OT Goals Patient Stated Goal: to go home  OT Goal Formulation: All assessment and education complete, DC therapy  OT Frequency:     Barriers to D/C:            Co-evaluation              AM-PAC PT "6 Clicks" Daily Activity     Outcome Measure Help from another person eating meals?: None Help from another person taking care of personal grooming?: None Help from another person toileting, which includes using toliet, bedpan, or urinal?: None Help from another person bathing (including washing, rinsing, drying)?: A Little Help from another person to put on and taking off regular upper body clothing?: None Help from another person to put on and taking off regular lower body clothing?: A Little 6 Click Score: 22   End of Session Equipment Utilized During Treatment: Back brace Nurse Communication: Mobility status;Other (comment)(no equipment or f/u needs)  Activity Tolerance: Patient tolerated treatment well Patient left: in chair;with call bell/phone within reach  OT Visit Diagnosis: Pain Pain - part of body: (back)                Time: [id number] OT Time Calculation (min): 24 min Charges:  OT General Charges $OT Visit: 1 Visit OT Evaluation $OT Eval Low Complexity: 1 Low OT Treatments $Self Care/Home Management : 8-22 mins G-Codes:    Sapir Lavey A.  Brett Albinooffey, M.S., OTR/L Pager: [phone]   Gaye AlkenBailey A Tavaris Eudy 12/03/2017, 4:06 PM

## 2017-12-03 NOTE — Progress Notes (Signed)
Patient is discharged from room 3C05 at this time. Alert and in stable condition. IV site d/c'd and instructions read to patient and spouse with understanding verbalized. Left unit via wheelchair with all belongings at side. 

## 2017-12-03 NOTE — Progress Notes (Signed)
    Subjective: Procedure(s) (LRB): Left Lumbar 4-5 disectomy (Left) 1 Day Post-Op  Patient reports pain as 1 on 0-10 scale.  Reports none leg pain reports incisional back pain   Positive void Negative bowel movement Positive flatus Negative chest pain or shortness of breath  Objective: Vital signs in last 24 hours: Temp:  [97.5 F (36.4 C)-98.6 F (37 C)] 98.5 F (36.9 C) (02/01 0410) Pulse Rate:  [66-102] 81 (02/01 0410) Resp:  [4-18] 18 (02/01 0410) BP: (103-125)/(61-82) 121/65 (02/01 0410) SpO2:  [93 %-100 %] 96 % (02/01 0410) Weight:  [112 kg (247 lb)] 112 kg (247 lb) (01/31 1054)  Intake/Output from previous day: 01/31 0701 - 02/01 0700 In: 1803 [I.V.:1803] Out: 75 [Blood:75]  Labs: No results for input(s): WBC, RBC, HCT, PLT in the last 72 hours. No results for input(s): NA, K, CL, CO2, BUN, CREATININE, GLUCOSE, CALCIUM in the last 72 hours. No results for input(s): LABPT, INR in the last 72 hours.  Physical Exam: Neurologically intact ABD soft Neurovascular intact Dorsiflexion/Plantar flexion intact Incision: dressing C/D/I Compartment soft Body mass index is 36.48 kg/m.   Assessment/Plan: Patient stable  xrays n/a Continue mobilization with physical therapy Continue care  Advance diet Up with therapy  D/c to home today  Venita Lickahari Deidrea Gaetz, MD [facility] [phone]

## 2017-12-07 NOTE — Discharge Summary (Signed)
Patient ID: Robert BrilliantHobert G Mcpherson MRN: [id number] DOB/AGE: December 06, 1975 42 y.o.  Admit date: 12/02/2017 Discharge date: 12/03/17  Admission Diagnoses:  Active Problems:   S/P lumbar microdiscectomy   Discharge Diagnoses:  Active Problems:   S/P lumbar microdiscectomy  status post Procedure(s): Left Lumbar 4-5 disectomy  Past Medical History:  Diagnosis Date  . Anxiety   . Dental crown present   . Depression   . Deviated nasal septum 09/2013  . High triglycerides   . 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 Nasal turbinate hypertrophy 09/2013  . OCD (obsessive compulsive disorder) 04/02/2014    Surgeries: Procedure(s): Left Lumbar 4-5 disectomy on 12/02/2017   Consultants:   Discharged Condition: Improved  Hospital Course: Robert Mcpherson is an 42 y.o. 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 Patient failed conservative treatments (please see the history and physical for the specifics) and had severe unremitting pain that affects sleep, daily activities and work/hobbies. After pre-op clearance, the patient was taken to the operating room on 12/02/2017 and underwent  Procedure(s): Left Lumbar 4-5 disectomy.  Post op day one pt reports low level of pain controlled on oral medication.  Pt ios ambulating in hallway. Pt is urinating w/o difficulty.  Pt is cleared by PT before Dc.   Patient was given perioperative antibiotics:  Anti-infectives (From admission, onward)   Start     Dose/Rate Route Frequency Ordered Stop   12/02/17 1930  vancomycin (VANCOCIN) IVPB 1000 mg/200 mL premix     1,000 mg 200 mL/hr over 60 Minutes Intravenous  Once 12/02/17 1140 12/03/17 0729   12/02/17 0600  vancomycin (VANCOCIN) 1,500 mg in sodium chloride 0.9 % 500 mL IVPB     1,500 mg 250 mL/hr over 120 Minutes Intravenous To [facility] 12/01/17 1026 12/02/17 1157       Patient was given sequential compression devices and early ambulation  to prevent DVT.   Patient benefited maximally from hospital stay and there were no complications. At the time of discharge, the patient was urinating/moving their bowels without difficulty, tolerating a regular diet, pain is controlled with oral pain medications and they have been cleared by PT/OT.   Recent vital signs: No data found.   Recent laboratory studies: No results for input(s): WBC, HGB, HCT, PLT, NA, K, CL, CO2, BUN, CREATININE, GLUCOSE, INR, CALCIUM in the last 72 hours.  Invalid input(s): PT, 2   Discharge Medications:   Allergies as of 12/03/2017      Reactions   Cephalexin Hives      Medication List    TAKE these medications   clonazePAM 1 MG tablet Commonly known as:  KLONOPIN Take 1 mg by mouth 2 (two) times daily as needed for anxiety.   methocarbamol 500 MG tablet Commonly known as:  ROBAXIN Take 1 tablet (500 mg total) by mouth 3 (three) times daily.   ondansetron 4 MG disintegrating tablet Commonly known as:  ZOFRAN ODT Take 1 tablet (4 mg total) by mouth every 8 (eight) hours as needed for nausea or vomiting.   oxyCODONE-acetaminophen 10-325 MG tablet Commonly known as:  PERCOCET Take 1 tablet by mouth every 4 (four) hours as needed for up to 5 days for pain.   sertraline 100 MG tablet Commonly known as:  ZOLOFT Take 100 mg by mouth daily.       Diagnostic Studies: Dg Lumbar Spine 2-3 Views  Result Date: 12/02/2017 CLINICAL DATA:  L4-5 discectomy. EXAM: LUMBAR SPINE - 2-3 VIEW COMPARISON:  KUB 11/23/2011 FINDINGS: There is no preoperative comparison imaging. Based on 2013 abdominal radiograph there are 5 lumbar type vertebral bodies. This places a retractor and probe at the L4-5 level. These results were called by telephone at the time of interpretation on 12/02/2017 at 8:40 am to OR 4. IMPRESSION: 1. Five lumbar type vertebral bodies on 2013 KUB. 2. Retractor and probe project at the L4-5 level. Electronically Signed   By: Marnee Spring M.D.   On:  12/02/2017 08:40    Discharge Instructions    Incentive spirometry RT   Complete by:  As directed         Discharge Plan:  discharge to home Pt will present to clinic in 2 weeks Post op meds provided   Disposition:     Signed: Temika Sutphin, Baxter Kail for Dr. Venita Lick [facility] [phone] 12/07/2017, 8:17 AM

## 2018-01-12 DIAGNOSIS — F4320 Adjustment disorder, unspecified: Secondary | ICD-10-CM | POA: Diagnosis not present

## 2018-01-19 DIAGNOSIS — F4320 Adjustment disorder, unspecified: Secondary | ICD-10-CM | POA: Diagnosis not present

## 2018-02-15 DIAGNOSIS — F422 Mixed obsessional thoughts and acts: Secondary | ICD-10-CM | POA: Diagnosis not present

## 2018-02-16 DIAGNOSIS — F4320 Adjustment disorder, unspecified: Secondary | ICD-10-CM | POA: Diagnosis not present

## 2018-02-23 DIAGNOSIS — F4320 Adjustment disorder, unspecified: Secondary | ICD-10-CM | POA: Diagnosis not present

## 2018-02-28 ENCOUNTER — Other Ambulatory Visit: Payer: Self-pay | Admitting: Family Medicine

## 2018-02-28 DIAGNOSIS — R945 Abnormal results of liver function studies: Principal | ICD-10-CM

## 2018-02-28 DIAGNOSIS — R7989 Other specified abnormal findings of blood chemistry: Secondary | ICD-10-CM

## 2018-03-02 DIAGNOSIS — F4320 Adjustment disorder, unspecified: Secondary | ICD-10-CM | POA: Diagnosis not present

## 2018-03-07 ENCOUNTER — Encounter: Payer: Self-pay | Admitting: Gastroenterology

## 2018-03-23 DIAGNOSIS — F4320 Adjustment disorder, unspecified: Secondary | ICD-10-CM | POA: Diagnosis not present

## 2018-03-29 IMAGING — CR DG LUMBAR SPINE 2-3V
1 series · 1 of 1 positions shown · non-contrast
Comparison: KUB 11/23/2011

CLINICAL DATA: L4-5 discectomy.

EXAM:
LUMBAR SPINE - 2-3 VIEW

[AP]
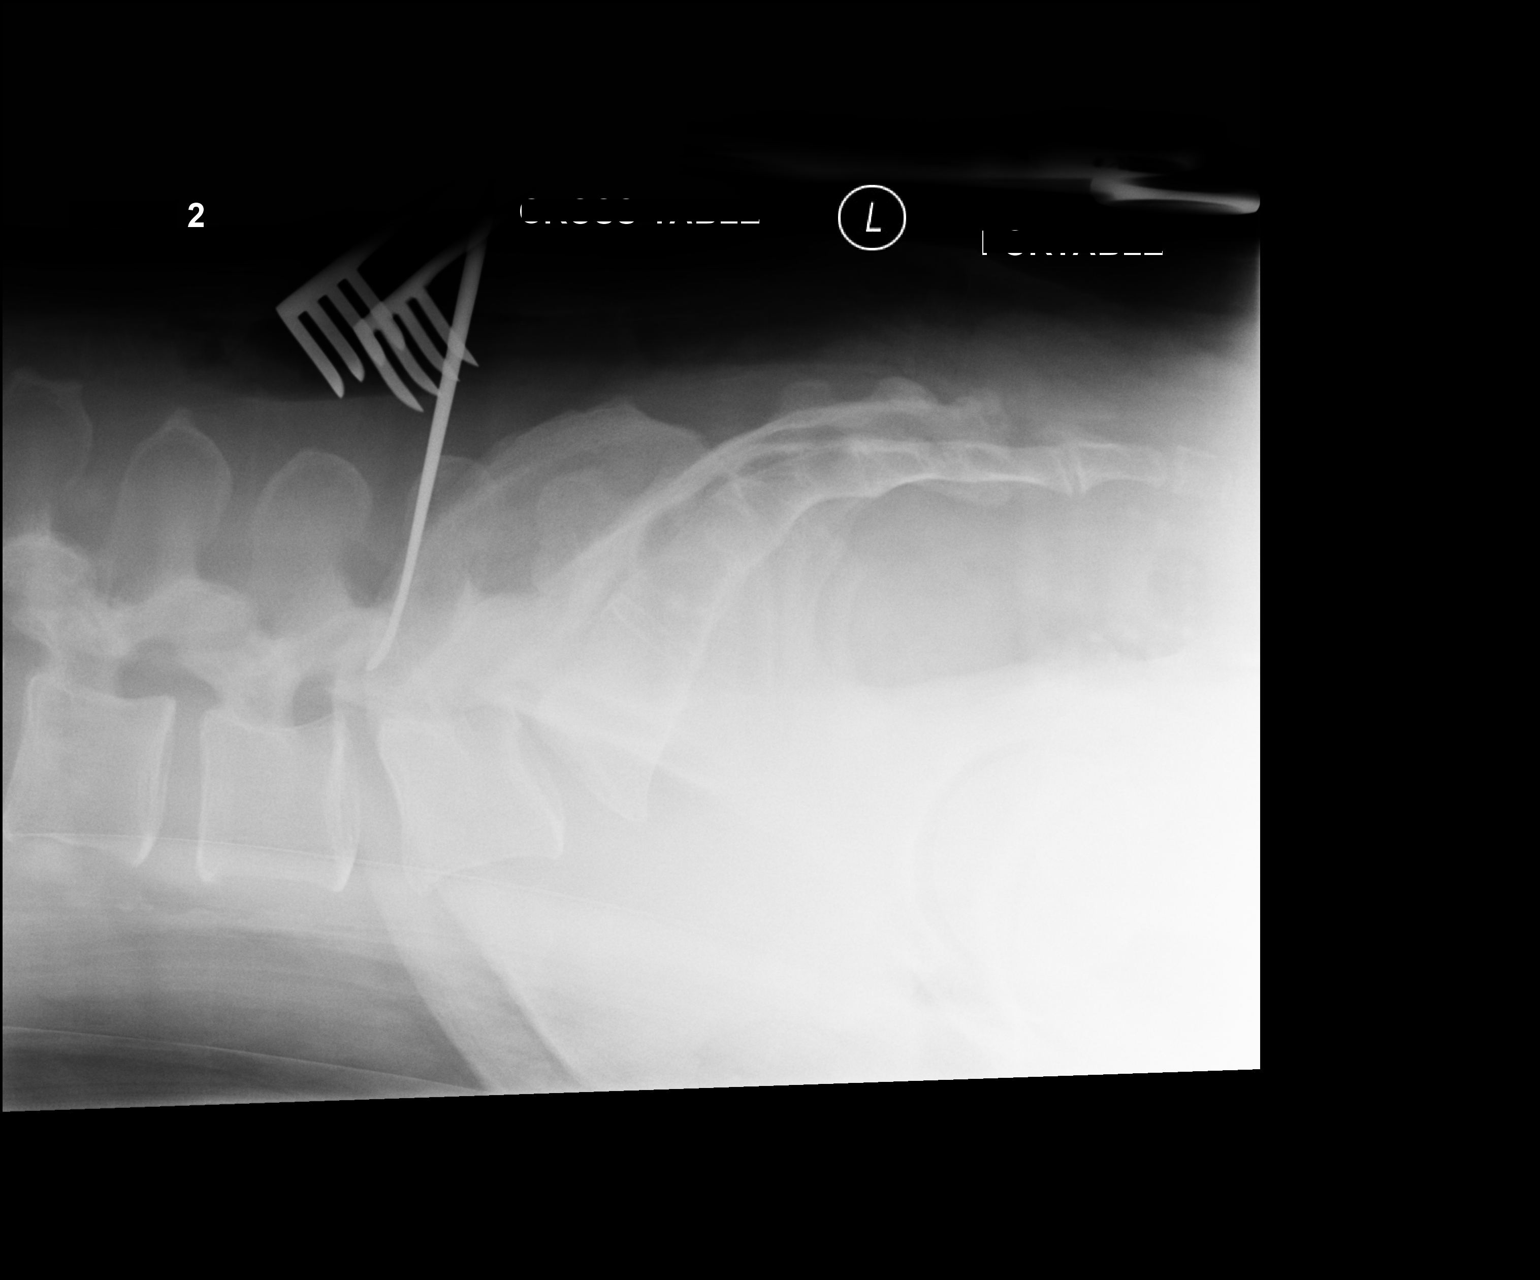

[1 of 1 positions shown; findings below may reference images not displayed]

FINDINGS: There is no preoperative comparison imaging. Based on 6062 abdominal
radiograph there are 5 lumbar type vertebral bodies. This places a
retractor and probe at the L4-5 level. These results were called by
telephone at the time of interpretation on 12/02/2017 at [DATE] to
OR 4.
IMPRESSION: 1. Five lumbar type vertebral bodies on 6062 KUB.
2. Retractor and probe project at the L4-5 level.

## 2018-04-06 DIAGNOSIS — F422 Mixed obsessional thoughts and acts: Secondary | ICD-10-CM | POA: Diagnosis not present

## 2018-04-06 DIAGNOSIS — F4320 Adjustment disorder, unspecified: Secondary | ICD-10-CM | POA: Diagnosis not present

## 2018-04-12 DIAGNOSIS — J029 Acute pharyngitis, unspecified: Secondary | ICD-10-CM | POA: Diagnosis not present

## 2018-04-12 DIAGNOSIS — R05 Cough: Secondary | ICD-10-CM | POA: Diagnosis not present

## 2018-05-11 DIAGNOSIS — Z4889 Encounter for other specified surgical aftercare: Secondary | ICD-10-CM | POA: Diagnosis not present

## 2018-05-20 DIAGNOSIS — M5116 Intervertebral disc disorders with radiculopathy, lumbar region: Secondary | ICD-10-CM | POA: Diagnosis not present

## 2018-05-24 DIAGNOSIS — M5116 Intervertebral disc disorders with radiculopathy, lumbar region: Secondary | ICD-10-CM | POA: Diagnosis not present

## 2018-05-25 DIAGNOSIS — F4320 Adjustment disorder, unspecified: Secondary | ICD-10-CM | POA: Diagnosis not present

## 2018-06-01 DIAGNOSIS — F4320 Adjustment disorder, unspecified: Secondary | ICD-10-CM | POA: Diagnosis not present

## 2018-06-01 DIAGNOSIS — F422 Mixed obsessional thoughts and acts: Secondary | ICD-10-CM | POA: Diagnosis not present

## 2018-06-03 ENCOUNTER — Encounter: Payer: Self-pay | Admitting: Gastroenterology

## 2018-06-03 ENCOUNTER — Ambulatory Visit (INDEPENDENT_AMBULATORY_CARE_PROVIDER_SITE_OTHER): Payer: BLUE CROSS/BLUE SHIELD | Admitting: Gastroenterology

## 2018-06-03 ENCOUNTER — Ambulatory Visit (INDEPENDENT_AMBULATORY_CARE_PROVIDER_SITE_OTHER): Payer: BLUE CROSS/BLUE SHIELD | Admitting: Family Medicine

## 2018-06-03 ENCOUNTER — Encounter: Payer: Self-pay | Admitting: Family Medicine

## 2018-06-03 VITALS — BP 136/86 | HR 96 | Temp 97.8°F | Resp 16 | Ht 70.0 in | Wt 258.0 lb

## 2018-06-03 DIAGNOSIS — H8110 Benign paroxysmal vertigo, unspecified ear: Secondary | ICD-10-CM | POA: Diagnosis not present

## 2018-06-03 DIAGNOSIS — R7989 Other specified abnormal findings of blood chemistry: Secondary | ICD-10-CM

## 2018-06-03 DIAGNOSIS — R945 Abnormal results of liver function studies: Secondary | ICD-10-CM | POA: Diagnosis not present

## 2018-06-03 DIAGNOSIS — R748 Abnormal levels of other serum enzymes: Secondary | ICD-10-CM | POA: Diagnosis not present

## 2018-06-03 DIAGNOSIS — I10 Essential (primary) hypertension: Secondary | ICD-10-CM

## 2018-06-03 MED ORDER — LOSARTAN POTASSIUM 50 MG PO TABS: 50.0000 mg | ORAL_TABLET | Freq: Every day | ORAL | 3 refills | Status: DC

## 2018-06-03 NOTE — Patient Instructions (Addendum)
I have ordered blood work for you to complete and an ultrasound of your liver and spleen.  We will decide on further evaluation once we review the labs and imaging.  Continue with the weight loss efforts, dietary changes. This is great!  I have attached a brief handout for further review.  We will see you in 4 months!  It was a pleasure to see you today. I strive to create trusting relationships with patients to provide genuine, compassionate, and quality care. I value your feedback. If you receive a survey regarding your visit,  I greatly appreciate you taking time to fill this out.   Gelene MinkAnna W. Anelisse Jacobson, PhD, ANP-BC [facility]    Fatty Liver Fatty liver, also called hepatic steatosis or steatohepatitis, is a condition in which too much fat has built up in your liver cells. The liver removes harmful substances from your bloodstream. It produces fluids your body needs. It also helps your body use and store energy from the food you eat. In many cases, fatty liver does not cause symptoms or problems. It is often diagnosed when tests are being done for other reasons. However, over time, fatty liver can cause inflammation that may lead to more serious liver problems, such as scarring of the liver (cirrhosis). What are the causes? Causes of fatty liver may include:  Drinking too much alcohol.  Poor nutrition.  Obesity.  Cushing syndrome.  Diabetes.  Hyperlipidemia.  Pregnancy.  Certain drugs.  Poisons.  Some viral infections.  What increases the risk? You may be more likely to develop fatty liver if you:  Abuse alcohol.  Are pregnant.  Are overweight.  Have diabetes.  Have hepatitis.  Have a high triglyceride level.  What are the signs or symptoms? Fatty liver often does not cause any symptoms. In cases where symptoms develop, they can include:  Fatigue.  Weakness.  Weight loss.  Confusion.  Abdominal pain.  Yellowing of your skin and the  white parts of your eyes (jaundice).  Nausea and vomiting.  How is this diagnosed? Fatty liver may be diagnosed by:  Physical exam and medical history.  Blood tests.  Imaging tests, such as an ultrasound, CT scan, or MRI.  Liver biopsy. A small sample of liver tissue is removed using a needle. The sample is then looked at under a microscope.  How is this treated? Fatty liver is often caused by other health conditions. Treatment for fatty liver may involve medicines and lifestyle changes to manage conditions such as:  Alcoholism.  High cholesterol.  Diabetes.  Being overweight or obese.  Follow these instructions at home:  Eat a healthy diet as directed by your health care provider.  Exercise regularly. This can help you lose weight and control your cholesterol and diabetes. Talk to your health care provider about an exercise plan and which activities are best for you.  Do not drink alcohol.  Take medicines only as directed by your health care provider. Contact a health care provider if: You have difficulty controlling your:  Blood sugar.  Cholesterol.  Alcohol consumption.  Get help right away if:  You have abdominal pain.  You have jaundice.  You have nausea and vomiting. This information is not intended to replace advice given to you by your health care provider. Make sure you discuss any questions you have with your health care provider. Document Released: 12/04/2005 Document Revised: 03/26/2016 Document Reviewed: 02/28/2014 Elsevier Interactive Patient Education  [employer]2018 Elsevier Inc.

## 2018-06-03 NOTE — Progress Notes (Signed)
Primary Care Physician:  Donita BrooksPickard, Warren T, MD Primary Gastroenterologist:  Dr. Darrick PennaFields   Chief Complaint  Patient presents with  . Elevated Hepatic Enzymes    HPI:   Robert Mcpherson is a 42 y.o. male presenting today at the request of Dr. Tanya NonesPickard secondary to elevated transaminases.    Was a binge drinker in his 8520s but no longer. Has one beer every 3-4 months. States his LFTs have always been elevated  as long as he can remember. Started eating healthy about 6 weeks ago. States he gained weight after back surgery in Jan 2019.    No blood transfusions. Tattoos when he was 22.   Will have a rare LUQ discomfort at times but rare. Feels a little more constipated than normal. BP has been increasing lately. Feels dizzy at times. BP 141/100. Has appt with PCP today.   Past Medical History:  Diagnosis Date  . Anxiety   . Dental crown present   . Depression   . Deviated nasal septum 09/2013  . High triglycerides   . 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 Nasal turbinate hypertrophy 09/2013  . OCD (obsessive compulsive disorder) 04/02/2014    Past Surgical History:  Procedure Laterality Date  . APPENDECTOMY    . LUMBAR LAMINECTOMY/DECOMPRESSION MICRODISCECTOMY Left 12/02/2017   Procedure: Left Lumbar 4-5 disectomy;  Surgeon: Venita LickBrooks, Dahari, MD;  [facility];  Service: Orthopedics;  Laterality: Left;  2 hrs  . NASAL SEPTOPLASTY W/ TURBINOPLASTY Bilateral 10/02/2013   Procedure: BILATERAL NASAL SEPTOPLASTY WITH TURBINATE REDUCTION;  Surgeon: Serena ColonelJefry Rosen, MD;  Location: [facility];  Service: ENT;  Laterality: Bilateral;  . SHOULDER ARTHROSCOPY Right 11/2013  . VASECTOMY  06/02/2013    Current Outpatient Medications  Medication Sig Dispense Refill  . Ascorbic Acid (VITAMIN C) 1000 MG tablet Take 1,000 mg by mouth 3 (three) times daily.    . clonazePAM (KLONOPIN) 1 MG tablet Take 1 mg by mouth at bedtime.     . NON FORMULARY Milk thistle   daily    . Vilazodone HCl  (VIIBRYD) 20 MG TABS Take 20 mg by mouth daily.    . APPLE CIDER VINEGAR PO Take by mouth.    . losartan (COZAAR) 50 MG tablet Take 1 tablet (50 mg total) by mouth daily. 30 tablet 3   No current facility-administered medications for this visit.     Allergies as of 06/03/2018 - Review Complete 06/03/2018  Allergen Reaction Noted  . Cephalexin Hives 06/06/2013    Family History  Problem Relation Age of Onset  . Heart disease Maternal Uncle 52       heart attack  . Heart disease Maternal Uncle 61       CVA  . Colon cancer Neg Hx   . Colon polyps Neg Hx   . Liver disease Neg Hx     Social History   Socioeconomic History  . Marital status: Married    Spouse name: Not on file  . Number of children: Not on file  . Years of education: Not on file  . Highest education level: Not on file  Occupational History  . Not on file  Social Needs  . Financial resource strain: Not on file  . Food insecurity:    Worry: Not on file    Inability: Not on file  . Transportation needs:    Medical: Not on file    Non-medical: Not on file  Tobacco Use  .  Smoking status: Never Smoker  . Smokeless tobacco: Never Used  Substance and Sexual Activity  . Alcohol use: No    Comment: history of alcohol use in his 62s, none now   . Drug use: No  . Sexual activity: Yes  Lifestyle  . Physical activity:    Days per week: Not on file    Minutes per session: Not on file  . Stress: Not on file  Relationships  . Social connections:    Talks on phone: Not on file    Gets together: Not on file    Attends religious service: Not on file    Active member of club or organization: Not on file    Attends meetings of clubs or organizations: Not on file    Relationship status: Not on file  . Intimate partner violence:    Fear of current or ex partner: Not on file    Emotionally abused: Not on file    Physically abused: Not on file    Forced sexual activity: Not on file  Other Topics Concern  . Not on  file  Social History Narrative  . Not on file    Review of Systems: Gen: Denies any fever, chills, fatigue, weight loss, lack of appetite.  CV: Denies chest pain, heart palpitations, peripheral edema, syncope.  Resp: Denies shortness of breath at rest or with exertion. Denies wheezing or cough.  GI: see HPI  GU : Denies urinary burning, urinary frequency, urinary hesitancy MS: Denies joint pain, muscle weakness, cramps, or limitation of movement.  Derm: Denies rash, itching, dry skin Psych: Denies depression, anxiety, memory loss, and confusion Heme: Denies bruising, bleeding, and enlarged lymph nodes.  Physical Exam: BP (!) 141/100   Pulse 93   Temp (!) 97 F (36.1 C) (Oral)   Ht 5' 9.5" (1.765 m)   Wt 258 lb 12.8 oz (117.4 kg)   BMI 37.67 kg/m  General:   Alert and oriented. Pleasant and cooperative. Well-nourished and well-developed.  Head:  Normocephalic and atraumatic. Eyes:  Without icterus, sclera clear and conjunctiva pink.  Ears:  Normal auditory acuity. Nose:  No deformity, discharge,  or lesions. Mouth:  No deformity or lesions, oral mucosa pink.  Lungs:  Clear to auscultation bilaterally. No wheezes, rales, or rhonchi. No distress.  Heart:  S1, S2 present without murmurs appreciated.  Abdomen:  +BS, soft, non-tender and non-distended. No HSM noted. No guarding or rebound. No masses appreciated.  Rectal:  Deferred  Msk:  Symmetrical without gross deformities. Normal posture. Extremities:  Without  edema. Neurologic:  Alert and  oriented x4;  grossly normal neurologically. Psych:  Alert and cooperative. Normal mood and affect.  Lab Results  Component Value Date   ALT 136 (H) 11/29/2017   AST 52 (H) 11/29/2017   ALKPHOS 51 11/29/2017   BILITOT 0.9 11/29/2017

## 2018-06-03 NOTE — Progress Notes (Signed)
Subjective:    Patient ID: Robert Mcpherson, male    DOB: 1975-11-28, 42 y.o.   MRN: [id number]  HPI  Patient has a history of elevated liver function test.  He is currently seeing GI for further work-up of this including lab work and right upper quadrant ultrasound.  He is here today to discuss what could possibly be the cause.  He has a history of dyslipidemia with low HDL as well as elevated triglycerides.  He is recently gained a lot of weight secondary to physical inactivity.  He also has elevated blood pressure suggesting underlying metabolic syndrome.  For all these reasons, I suspect that the patient has fatty liver disease.  Second issue is episodes of vertigo that occurred recently.  Patient states he rolled over in bed, and suddenly the room began to spin.  The spinning continue until he held completely still for several moments and then the vertigo stopped.  It recurred several times with position changes.  He denies any hearing loss, headache, or head trauma.  Third issue has been elevated blood pressure which is been averaging in the 140s over 90s to 100.  He also reports a dull constant frontal headache. Past Medical History:  Diagnosis Date  . Anxiety   . Dental crown present   . Depression   . Deviated nasal septum 09/2013  . High triglycerides   . 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 Nasal turbinate hypertrophy 09/2013  . OCD (obsessive compulsive disorder) 04/02/2014   Past Surgical History:  Procedure Laterality Date  . APPENDECTOMY    . LUMBAR LAMINECTOMY/DECOMPRESSION MICRODISCECTOMY Left 12/02/2017   Procedure: Left Lumbar 4-5 disectomy;  Surgeon: Venita Lick, MD;  Location: [facility] OR;  Service: Orthopedics;  Laterality: Left;  2 hrs  . NASAL SEPTOPLASTY W/ TURBINOPLASTY Bilateral 10/02/2013   Procedure: BILATERAL NASAL SEPTOPLASTY WITH TURBINATE REDUCTION;  Surgeon: Serena Colonel, MD;  Location: [facility];  Service: ENT;  Laterality: Bilateral;  . SHOULDER  ARTHROSCOPY Right 11/2013  . VASECTOMY  06/02/2013   Current Outpatient Medications on File Prior to Visit  Medication Sig Dispense Refill  . APPLE CIDER VINEGAR PO Take by mouth.    . Ascorbic Acid (VITAMIN C) 1000 MG tablet Take 1,000 mg by mouth 3 (three) times daily.    . clonazePAM (KLONOPIN) 1 MG tablet Take 1 mg by mouth at bedtime.     . NON FORMULARY Milk thistle   daily    . Vilazodone HCl (VIIBRYD) 20 MG TABS Take 20 mg by mouth daily.     No current facility-administered medications on file prior to visit.    Allergies  Allergen Reactions  . Cephalexin Hives   Social History   Socioeconomic History  . Marital status: Married    Spouse name: Not on file  . Number of children: Not on file  . Years of education: Not on file  . Highest education level: Not on file  Occupational History  . Not on file  Social Needs  . Financial resource strain: Not on file  . Food insecurity:    Worry: Not on file    Inability: Not on file  . Transportation needs:    Medical: Not on file    Non-medical: Not on file  Tobacco Use  . Smoking status: Never Smoker  . Smokeless tobacco: Never Used  Substance and Sexual Activity  . Alcohol use: No    Comment: history of alcohol use in his 69s,  none now   . Drug use: No  . Sexual activity: Yes  Lifestyle  . Physical activity:    Days per week: Not on file    Minutes per session: Not on file  . Stress: Not on file  Relationships  . Social connections:    Talks on phone: Not on file    Gets together: Not on file    Attends religious service: Not on file    Active member of club or organization: Not on file    Attends meetings of clubs or organizations: Not on file    Relationship status: Not on file  . Intimate partner violence:    Fear of current or ex partner: Not on file    Emotionally abused: Not on file    Physically abused: Not on file    Forced sexual activity: Not on file  Other Topics Concern  . Not on file  Social  History Narrative  . Not on file      Review of Systems  All other systems reviewed and are negative.      Objective:   Physical Exam  Constitutional: He is oriented to person, place, and time. He appears well-developed and well-nourished.  HENT:  Right Ear: External ear normal.  Left Ear: External ear normal.  Nose: Nose normal.  Mouth/Throat: Oropharynx is clear and moist. No oropharyngeal exudate.  Eyes: Pupils are equal, round, and reactive to light. Conjunctivae and EOM are normal.  Neck: Normal range of motion. Neck supple. No thyromegaly present.  Cardiovascular: Normal rate, regular rhythm and normal heart sounds.  No murmur heard. Pulmonary/Chest: Effort normal and breath sounds normal.  Lymphadenopathy:    He has no cervical adenopathy.  Neurological: He is alert and oriented to person, place, and time. He has normal reflexes. No cranial nerve deficit. He exhibits normal muscle tone. Coordination normal.  Skin: No rash noted. No erythema.  Psychiatric: He has a normal mood and affect. His behavior is normal. Judgment and thought content normal.  Vitals reviewed.         Assessment & Plan:  Essential hypertension, benign paroxysmal positional vertigo, elevated liver function test.   Begin losartan 50 mg p.o. daily for hypertension and recheck blood work and blood pressure in 1 month.  Spent more than 20 minutes with the patient discussing benign paroxysmal vertigo explaining its cause and treatment options.  Symptoms have resolved spontaneously and therefore no further treatment is necessary at this time.  Spent 10 minutes with the patient discussing lifestyle changes for nonalcoholic steatohepatitis including a low carbohydrate low saturated fat diet, 30 minutes a day 5 days a week of aerobic exercise, and 20 to 30 pounds of weight loss.  Patient is following up with gastroenterology for further testing to rule out other potential causes of elevated liver function test  however my leading suspicion is that the patient has NASH

## 2018-06-08 NOTE — Assessment & Plan Note (Signed)
42 year old male with elevated AST/ALT (52 and 136) respectively, likely secondary to fatty liver. Needs ultrasound abdomen complete to evaluate for any fibrosis, chronic liver disease, splenomegaly. Will also order updated LFTs, viral serologies, CBC, iron studies. May need additional serologies if persistently elevated LFTs. Discussed at length diet and exercise modification. Further recommendations to follow once imaging and labs reviewed.

## 2018-06-09 DIAGNOSIS — L218 Other seborrheic dermatitis: Secondary | ICD-10-CM | POA: Diagnosis not present

## 2018-06-09 DIAGNOSIS — D1801 Hemangioma of skin and subcutaneous tissue: Secondary | ICD-10-CM | POA: Diagnosis not present

## 2018-06-09 DIAGNOSIS — L738 Other specified follicular disorders: Secondary | ICD-10-CM | POA: Diagnosis not present

## 2018-06-09 DIAGNOSIS — D225 Melanocytic nevi of trunk: Secondary | ICD-10-CM | POA: Diagnosis not present

## 2018-06-09 NOTE — Progress Notes (Signed)
CC'D TO PCP °

## 2018-06-10 ENCOUNTER — Ambulatory Visit (INDEPENDENT_AMBULATORY_CARE_PROVIDER_SITE_OTHER): Payer: BLUE CROSS/BLUE SHIELD | Admitting: Family Medicine

## 2018-06-10 ENCOUNTER — Encounter: Payer: Self-pay | Admitting: Family Medicine

## 2018-06-10 VITALS — BP 128/80 | HR 95 | Temp 98.1°F | Resp 14 | Ht 70.0 in | Wt 258.0 lb

## 2018-06-10 DIAGNOSIS — I10 Essential (primary) hypertension: Secondary | ICD-10-CM

## 2018-06-10 DIAGNOSIS — E785 Hyperlipidemia, unspecified: Secondary | ICD-10-CM | POA: Diagnosis not present

## 2018-06-10 DIAGNOSIS — Z0000 Encounter for general adult medical examination without abnormal findings: Secondary | ICD-10-CM | POA: Diagnosis not present

## 2018-06-10 DIAGNOSIS — E786 Lipoprotein deficiency: Secondary | ICD-10-CM

## 2018-06-10 DIAGNOSIS — R945 Abnormal results of liver function studies: Secondary | ICD-10-CM | POA: Diagnosis not present

## 2018-06-10 DIAGNOSIS — R7989 Other specified abnormal findings of blood chemistry: Secondary | ICD-10-CM

## 2018-06-10 NOTE — Progress Notes (Signed)
Subjective:    Patient ID: Robert Mcpherson, male    DOB: 1976/06/04, 42 y.o.   MRN: [id number]  HPI Patient is a very pleasant 42 year old Caucasian male here today for complete physical exam.  Earlier this year he had a lumbar laminectomy/decompression microdiscectomy.  Afterward he has been relatively sedentary and as a result he is gained substantial weight.  His liver function test of elevated suspicious for fatty liver disease.  He has seen gastroenterology who is recommending viral hepatitis serologies along with a right upper quadrant ultrasound.  This is pending.  He is here today to discuss this along with his preventative care.  I have recommended that he eat a low carbohydrate low saturated fat diet and increase his aerobic exercise.  Patient states that he is trying to walk every day.  He is also eating more vegetables and avoiding red meat and trying to avoid pork and saturated fat. Past Medical History:  Diagnosis Date  . Anxiety   . Dental crown present   . Depression   . Deviated nasal septum 09/2013  . High triglycerides   . 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 Nasal turbinate hypertrophy 09/2013  . OCD (obsessive compulsive disorder) 04/02/2014   Past Surgical History:  Procedure Laterality Date  . APPENDECTOMY    . LUMBAR LAMINECTOMY/DECOMPRESSION MICRODISCECTOMY Left 12/02/2017   Procedure: Left Lumbar 4-5 disectomy;  Surgeon: Venita Lick, MD;  Location: [facility];  Service: Orthopedics;  Laterality: Left;  2 hrs  . NASAL SEPTOPLASTY W/ TURBINOPLASTY Bilateral 10/02/2013   Procedure: BILATERAL NASAL SEPTOPLASTY WITH TURBINATE REDUCTION;  Surgeon: Serena Colonel, MD;  Location: [facility];  Service: ENT;  Laterality: Bilateral;  . SHOULDER ARTHROSCOPY Right 11/2013  . VASECTOMY  06/02/2013   Current Outpatient Medications on File Prior to Visit  Medication Sig Dispense Refill  . APPLE CIDER VINEGAR PO Take by mouth.    . Ascorbic Acid (VITAMIN C) 1000 MG tablet  Take 1,000 mg by mouth 3 (three) times daily.    . clonazePAM (KLONOPIN) 1 MG tablet Take 1 mg by mouth at bedtime.     Marland Kitchen losartan (COZAAR) 50 MG tablet Take 1 tablet (50 mg total) by mouth daily. 30 tablet 3  . Milk Thistle 1000 MG CAPS milk thistle    . NON FORMULARY Milk thistle   daily    . Vilazodone HCl (VIIBRYD) 20 MG TABS Take 20 mg by mouth daily.     No current facility-administered medications on file prior to visit.    Allergies  Allergen Reactions  . Cephalexin Hives   Social History   Socioeconomic History  . Marital status: Married    Spouse name: Not on file  . Number of children: Not on file  . Years of education: Not on file  . Highest education level: Not on file  Occupational History  . Not on file  Social Needs  . Financial resource strain: Not on file  . Food insecurity:    Worry: Not on file    Inability: Not on file  . Transportation needs:    Medical: Not on file    Non-medical: Not on file  Tobacco Use  . Smoking status: Never Smoker  . Smokeless tobacco: Never Used  Substance and Sexual Activity  . Alcohol use: No    Comment: history of alcohol use in his 46s, none now   . Drug use: No  . Sexual activity: Yes  Lifestyle  .  Physical activity:    Days per week: Not on file    Minutes per session: Not on file  . Stress: Not on file  Relationships  . Social connections:    Talks on phone: Not on file    Gets together: Not on file    Attends religious service: Not on file    Active member of club or organization: Not on file    Attends meetings of clubs or organizations: Not on file    Relationship status: Not on file  . Intimate partner violence:    Fear of current or ex partner: Not on file    Emotionally abused: Not on file    Physically abused: Not on file    Forced sexual activity: Not on file  Other Topics Concern  . Not on file  Social History Narrative  . Not on file   Family History  Problem Relation Age of Onset  . Heart  disease Maternal Uncle 52       heart attack  . Heart disease Maternal Uncle 61       CVA  . Colon cancer Neg Hx   . Colon polyps Neg Hx   . Liver disease Neg Hx       Review of Systems  All other systems reviewed and are negative.      Objective:   Physical Exam  Constitutional: He is oriented to person, place, and time. He appears well-developed and well-nourished. No distress.  HENT:  Head: Normocephalic and atraumatic.  Right Ear: External ear normal.  Left Ear: External ear normal.  Nose: Nose normal.  Mouth/Throat: Oropharynx is clear and moist. No oropharyngeal exudate.  Eyes: Pupils are equal, round, and reactive to light. Conjunctivae and EOM are normal. Right eye exhibits no discharge. Left eye exhibits no discharge. No scleral icterus.  Neck: Normal range of motion. Neck supple. No JVD present. No tracheal deviation present. No thyromegaly present.  Cardiovascular: Normal rate, regular rhythm, normal heart sounds and intact distal pulses. Exam reveals no gallop and no friction rub.  No murmur heard. Pulmonary/Chest: Effort normal and breath sounds normal. No stridor. No respiratory distress. He has no wheezes. He has no rales. He exhibits no tenderness.  Abdominal: Soft. Bowel sounds are normal. He exhibits no distension and no mass. There is no tenderness. There is no rebound and no guarding. Hernia confirmed negative in the right inguinal area and confirmed negative in the left inguinal area.  Genitourinary: Testes normal and penis normal. Cremasteric reflex is present. Right testis shows no mass. Left testis shows no mass.  Musculoskeletal: Normal range of motion. He exhibits no edema, tenderness or deformity.  Lymphadenopathy:    He has no cervical adenopathy.       Right: No inguinal adenopathy present.       Left: No inguinal adenopathy present.  Neurological: He is alert and oriented to person, place, and time. He has normal reflexes. No cranial nerve deficit.  He exhibits normal muscle tone. Coordination normal.  Skin: Skin is warm. No rash noted. He is not diaphoretic. No erythema. No pallor.  Psychiatric: He has a normal mood and affect. His behavior is normal. Judgment and thought content normal.  Vitals reviewed.         Assessment & Plan:  Benign essential HTN  Elevated LFTs  Routine general medical examination at a health care facility  Hyperlipidemia with low HDL - Plan: COMPLETE METABOLIC PANEL WITH GFR, Lipid panel  I will obtain  fasting lab work when he goes to get his other lab work from the gastroenterologist office.  I will place the orders in epic so that it can be drawn at the same time as a pending future order.  I will check a fasting lipid panel along with a CMP.  I encouraged 20 to 30 pounds weight loss, 30 minutes of aerobic exercise 5 days a week, and a low saturated fat low carbohydrate diet rich in fruits and vegetables and low in red meat and pork and fried foods.  Recommended a flu shot this fall when it becomes available.  Patient politely declined an HIV test.  His blood pressure today is much better

## 2018-06-14 ENCOUNTER — Ambulatory Visit (HOSPITAL_COMMUNITY): Payer: BLUE CROSS/BLUE SHIELD

## 2018-06-22 DIAGNOSIS — F4320 Adjustment disorder, unspecified: Secondary | ICD-10-CM | POA: Diagnosis not present

## 2018-06-23 DIAGNOSIS — M5116 Intervertebral disc disorders with radiculopathy, lumbar region: Secondary | ICD-10-CM | POA: Diagnosis not present

## 2018-06-24 ENCOUNTER — Ambulatory Visit (HOSPITAL_COMMUNITY): Payer: BLUE CROSS/BLUE SHIELD

## 2018-06-29 DIAGNOSIS — F4320 Adjustment disorder, unspecified: Secondary | ICD-10-CM | POA: Diagnosis not present

## 2018-06-30 ENCOUNTER — Ambulatory Visit (HOSPITAL_COMMUNITY): Payer: BLUE CROSS/BLUE SHIELD

## 2018-07-08 ENCOUNTER — Ambulatory Visit: Payer: BLUE CROSS/BLUE SHIELD | Admitting: Family Medicine

## 2018-07-08 ENCOUNTER — Ambulatory Visit (HOSPITAL_COMMUNITY): Payer: BLUE CROSS/BLUE SHIELD | Attending: Gastroenterology

## 2018-07-12 ENCOUNTER — Encounter: Payer: Self-pay | Admitting: Family Medicine

## 2018-07-13 DIAGNOSIS — F4320 Adjustment disorder, unspecified: Secondary | ICD-10-CM | POA: Diagnosis not present

## 2018-08-03 DIAGNOSIS — F4320 Adjustment disorder, unspecified: Secondary | ICD-10-CM | POA: Diagnosis not present

## 2018-08-10 DIAGNOSIS — F4320 Adjustment disorder, unspecified: Secondary | ICD-10-CM | POA: Diagnosis not present

## 2018-08-15 ENCOUNTER — Telehealth: Payer: Self-pay | Admitting: Family Medicine

## 2018-08-15 DIAGNOSIS — G4730 Sleep apnea, unspecified: Secondary | ICD-10-CM

## 2018-08-15 DIAGNOSIS — R0683 Snoring: Secondary | ICD-10-CM

## 2018-08-15 NOTE — Telephone Encounter (Signed)
OK to do referral

## 2018-08-15 NOTE — Telephone Encounter (Signed)
sure

## 2018-08-15 NOTE — Telephone Encounter (Signed)
Referral placed.

## 2018-08-15 NOTE — Telephone Encounter (Signed)
Wife Stanton Kidney called requesting a referral for her patient for a sleep apnea she believes the patient has discussed this before with Dr. Tanya Nones.  CB# [phone] is the patient's number.

## 2018-08-24 ENCOUNTER — Ambulatory Visit: Payer: Self-pay | Admitting: Physician Assistant

## 2018-08-29 ENCOUNTER — Ambulatory Visit: Payer: Self-pay | Admitting: Physician Assistant

## 2018-08-31 DIAGNOSIS — F4320 Adjustment disorder, unspecified: Secondary | ICD-10-CM | POA: Diagnosis not present

## 2018-09-01 ENCOUNTER — Other Ambulatory Visit: Payer: Self-pay | Admitting: Physician Assistant

## 2018-09-01 MED ORDER — CLONAZEPAM 1 MG PO TABS: 1.0000 mg | ORAL_TABLET | Freq: Three times a day (TID) | ORAL | 0 refills | Status: DC | PRN

## 2018-09-06 ENCOUNTER — Ambulatory Visit (INDEPENDENT_AMBULATORY_CARE_PROVIDER_SITE_OTHER): Payer: BLUE CROSS/BLUE SHIELD | Admitting: Neurology

## 2018-09-06 ENCOUNTER — Encounter: Payer: Self-pay | Admitting: Neurology

## 2018-09-06 VITALS — BP 177/126 | HR 101 | Ht 69.5 in | Wt 261.0 lb

## 2018-09-06 DIAGNOSIS — G4733 Obstructive sleep apnea (adult) (pediatric): Secondary | ICD-10-CM

## 2018-09-06 DIAGNOSIS — G4719 Other hypersomnia: Secondary | ICD-10-CM

## 2018-09-06 DIAGNOSIS — R519 Headache, unspecified: Secondary | ICD-10-CM

## 2018-09-06 DIAGNOSIS — R635 Abnormal weight gain: Secondary | ICD-10-CM

## 2018-09-06 DIAGNOSIS — E669 Obesity, unspecified: Secondary | ICD-10-CM

## 2018-09-06 DIAGNOSIS — R51 Headache: Secondary | ICD-10-CM

## 2018-09-06 DIAGNOSIS — G4720 Circadian rhythm sleep disorder, unspecified type: Secondary | ICD-10-CM

## 2018-09-06 DIAGNOSIS — R030 Elevated blood-pressure reading, without diagnosis of hypertension: Secondary | ICD-10-CM

## 2018-09-06 DIAGNOSIS — R0789 Other chest pain: Secondary | ICD-10-CM

## 2018-09-06 NOTE — Patient Instructions (Signed)
Thank you for choosing [facility] for your sleep related care! It was nice to meet you today! I appreciate that you entrust me with your sleep related healthcare concerns. I hope, I was able to address at least some of your concerns today, and that I can help you feel reassured and also get better.    Here is what we discussed today and what we came up with as our plan for you:    Based on your symptoms and your exam I believe you are still at risk for obstructive sleep apnea and would benefit from reevaluation as it has been 5 years and you have had significant weight gain. Therefore, I think we should proceed with a sleep study to determine how severe your sleep apnea is. If you have more than mild OSA, I want you to consider ongoing treatment with CPAP. Please remember, the risks and ramifications of moderate to severe obstructive sleep apnea or OSA are: Cardiovascular disease, including congestive heart failure, stroke, difficult to control hypertension, arrhythmias, and even type 2 diabetes has been linked to untreated OSA. Sleep apnea causes disruption of sleep and sleep deprivation in most cases, which, in turn, can cause recurrent headaches, problems with memory, mood, concentration, focus, and vigilance. Most people with untreated sleep apnea report excessive daytime sleepiness, which can affect their ability to drive. Please do not drive if you feel sleepy.   I will likely see you back after your sleep study to go over the test results and where to go from there. We will call you after your sleep study to advise about the results (most likely, you will hear from [place], my nurse) and to set up an appointment at the time, as necessary.    Our sleep lab administrative assistant will call you to schedule your sleep study. If you don't hear back from her by about 2 weeks from now, please feel free to call her at [phone]. You can leave a message with your phone number and  concerns, if you get the voicemail box. She will call back as soon as possible.

## 2018-09-06 NOTE — Progress Notes (Signed)
Subjective:    Patient ID: Robert Mcpherson is a 42 y.o. male.  HPI     Huston Foley, MD, PhD [facility] [address] [address] [place], [place] [address]  Dear Dr. Tanya Nones,   I saw your patient, Robert Mcpherson, upon your kind request, in my sleep clinic today for initial consultation of his sleep disorder, in particular, concern for underlying obstructive sleep apnea. The patient is unaccompanied today. As you know, [name] is a 42 year old right-handed gentleman with an underlying medical history of hypertriglyceridemia, OCD, anxiety, depression, recent status post lumbar laminectomy, status post septoplasty and turbinate reduction, shoulder arthroscopic surgery, and history of obesity, who reports snoring and excessive daytime somnolence. I reviewed your office note from 06/10/2018. He had a sleep study 5 years ago which showed mild sleep apnea. I reviewed his PSG report from 09/11/2013, study was interpreted by Dr. Maple Hudson. Of note, his weight was recorded to be 200 tablet at the time, BMI of 31. Overall AHI was 11 per hour. He achieved all stages of sleep with 15.9% of REM sleep, sleep latency was 45 minutes, REM latency 78 minutes. Average oxygen saturation was 93.2%, nadir was 83%. No significant PLMs. His Epworth sleepiness score is 10 out of 24 today, fatigue score is 56 out of 56 (one question was not answered, so total score is out of 56, not 63). Is married and lives with his wife and children. He has a total of 4 children and is self-employed, Radio broadcast assistant. He is a nonsmoker and drinks alcohol occasionally, maybe twice a month or so and drinks caffeine daily in the form of iced coffee about 2 per day, large servings, which helps him stay alert during the day. He reports significant weight gain secondary to back pain. He has a family history of sleep apnea and has several uncles and cousins on CPAP machines. He would be willing to try CPAP. He did not  have much in the way of improvement of his nasal breathing after nasal surgery. He does not keep a very set schedule for sleep. He reports sleeping some during the day and also somewhat night. Typical bedtime is after midnight, typically before 2 AM. Rise time is around 8. He takes a 3-4 hour nap every day around 1 or 2 PM. He has 4 younger children, ages 86, 78, 61, and for. They are all homeschooled. He denies night to night nocturia but has had morning headaches. He has had mood irritability especially when he is sleep deprived or not sleeping well.  His Past Medical History Is Significant For: Past Medical History:  Diagnosis Date  . Anxiety   . Dental crown present   . Depression   . Deviated nasal septum 09/2013  . High triglycerides   . 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 Nasal turbinate hypertrophy 09/2013  . OCD (obsessive compulsive disorder) 04/02/2014    His Past Surgical History Is Significant For: Past Surgical History:  Procedure Laterality Date  . APPENDECTOMY    . LUMBAR LAMINECTOMY/DECOMPRESSION MICRODISCECTOMY Left 12/02/2017   Procedure: Left Lumbar 4-5 disectomy;  Surgeon: Venita Lick, MD;  Location: [facility] OR;  Service: Orthopedics;  Laterality: Left;  2 hrs  . NASAL SEPTOPLASTY W/ TURBINOPLASTY Bilateral 10/02/2013   Procedure: BILATERAL NASAL SEPTOPLASTY WITH TURBINATE REDUCTION;  Surgeon: Serena Colonel, MD;  Location: [facility];  Service: ENT;  Laterality: Bilateral;  . SHOULDER ARTHROSCOPY Right 11/2013  . VASECTOMY  06/02/2013  His Family History Is Significant For: Family History  Problem Relation Age of Onset  . Heart disease Maternal Uncle 52       heart attack  . Heart disease Maternal Uncle 61       CVA  . Colon cancer Neg Hx   . Colon polyps Neg Hx   . Liver disease Neg Hx     His Social History Is Significant For: Social History   Socioeconomic History  . Marital status: Married    Spouse name: Not on file  . Number of children: Not  on file  . Years of education: Not on file  . Highest education level: Not on file  Occupational History  . Not on file  Social Needs  . Financial resource strain: Not on file  . Food insecurity:    Worry: Not on file    Inability: Not on file  . Transportation needs:    Medical: Not on file    Non-medical: Not on file  Tobacco Use  . Smoking status: Never Smoker  . Smokeless tobacco: Never Used  Substance and Sexual Activity  . Alcohol use: No    Comment: history of alcohol use in his 71s, none now   . Drug use: No  . Sexual activity: Yes  Lifestyle  . Physical activity:    Days per week: Not on file    Minutes per session: Not on file  . Stress: Not on file  Relationships  . Social connections:    Talks on phone: Not on file    Gets together: Not on file    Attends religious service: Not on file    Active member of club or organization: Not on file    Attends meetings of clubs or organizations: Not on file    Relationship status: Not on file  Other Topics Concern  . Not on file  Social History Narrative  . Not on file    His Allergies Are:  Allergies  Allergen Reactions  . Cephalexin Hives  :   His Current Medications Are:  Outpatient Encounter Medications as of 09/06/2018  Medication Sig  . APPLE CIDER VINEGAR PO Take by mouth.  . Ascorbic Acid (VITAMIN C) 1000 MG tablet Take 1,000 mg by mouth 3 (three) times daily.  . clonazePAM (KLONOPIN) 1 MG tablet Take 1 tablet (1 mg total) by mouth 3 (three) times daily as needed for anxiety.  Marland Kitchen losartan (COZAAR) 50 MG tablet Take 1 tablet (50 mg total) by mouth daily.  . Milk Thistle 1000 MG CAPS milk thistle  . NON FORMULARY Milk thistle   daily  . traZODone (DESYREL) 50 MG tablet Take 50 mg by mouth daily as needed for sleep (1/2 -2 tab as needed).  . Vilazodone HCl (VIIBRYD) 20 MG TABS Take 20 mg by mouth daily.  . [DISCONTINUED] clonazePAM (KLONOPIN) 1 MG tablet Take 1 mg by mouth 3 (three) times daily as needed  for anxiety.    No facility-administered encounter medications on file as of 09/06/2018.   :  Review of Systems:  Out of a complete 14 point review of systems, all are reviewed and negative with the exception of these symptoms as listed below: Review of Systems  Neurological:       Pt presents today to discuss his sleep. Pt has had a sleep study 8-10 years ago but has never been put on a cpap machine. Pt does endorse snoring.  Epworth Sleepiness Scale 0= would never doze 1=  slight chance of dozing 2= moderate chance of dozing 3= high chance of dozing  Sitting and reading: 0 Watching TV: 3 Sitting inactive in a public place (ex. Theater or meeting): 0 As a passenger in a car for an hour without a break: 0 Lying down to rest in the afternoon: 3 Sitting and talking to someone: 1 Sitting quietly after lunch (no alcohol): 3 In a car, while stopped in traffic: 0 Total: 10     Objective:  Neurological Exam  Physical Exam Physical Examination:   Vitals:   09/06/18 1320  BP: (!) 196/136  Pulse: (!) 113   General Examination: The patient is a very pleasant 42 y.o. male in no acute distress. He appears well-developed and well-nourished and well groomed. Reports being sensitive to light. Repeat blood pressure was 177/126.   HEENT: Normocephalic, atraumatic, pupils are equal, round and reactive to light and accommodation. Extraocular tracking is good without limitation to gaze excursion or nystagmus noted. Normal smooth pursuit is noted. Hearing is grossly intact. Face is symmetric with normal facial animation and normal facial sensation. Speech is clear with no dysarthria noted. There is no hypophonia. There is no lip, neck/head, jaw or voice tremor. Neck is supple with full range of passive and active motion. There are no carotid bruits on auscultation. Oropharynx exam reveals: moderate mouth dryness, adequate dental hygiene and marked airway crowding, due to smaller airway entry,  thicker soft palate, tonsils of 2+, larger uvula. Neck circumference is 19-3/8 inches. Nasal inspection reveals narrow nasal passages, left worse than right, he is status post nasal surgery with correction of deviated septum. Mallampati is class III. Tongue protrudes centrally and palate elevates symmetrically.   Chest: Clear to auscultation without wheezing, rhonchi or crackles noted.  Heart: S1+S2+0, regular and normal without murmurs, rubs or gallops noted.   Abdomen: Soft, non-tender and non-distended with normal bowel sounds appreciated on auscultation.  Extremities: There is no pitting edema in the distal lower extremities bilaterally.   Skin: Warm and dry without trophic changes noted.  Musculoskeletal: exam reveals no obvious joint deformities, tenderness or joint swelling or erythema.   Neurologically:  Mental status: The patient is awake, alert and oriented in all 4 spheres. His immediate and remote memory, attention, language skills and fund of knowledge are appropriate. There is no evidence of aphasia, agnosia, apraxia or anomia. Speech is clear with normal prosody and enunciation. Thought process is linear. Mood is normal and affect is normal.  Cranial nerves II - XII are as described above under HEENT exam.  Motor exam: Normal bulk, strength and tone is noted. There is no tremor. Fine motor skills and coordination: intact with normal finger taps, normal hand movements, normal rapid alternating patting, normal foot taps and normal foot agility.  Cerebellar testing: No dysmetria or intention tremor on finger to nose testing. Heel to shin is unremarkable bilaterally. There is no truncal or gait ataxia.  Sensory exam: intact to light touch in the upper and lower extremities.  Gait, station and balance: He stands easily. No veering to one side is noted. No leaning to one side is noted. Posture is age-appropriate and stance is narrow based. Gait shows normal stride length and normal pace.  No problems turning are noted.   Assessment and Plan:  In summary, Dael BOYDE GRIECO is a very pleasant 42 y.o.-year old male with an underlying medical history of hypertriglyceridemia, OCD, anxiety, depression, recent status post lumbar laminectomy, status post septoplasty and turbinate reduction, shoulder arthroscopic  surgery, and history of obesity, who presents for reevaluation of his obstructive sleep apnea. He had sleep study testing 5 years ago which indicated mild sleep apnea but unfortunately he had significant weight gain in the interim in the realm of 50 pounds. He would benefit from reevaluation with a sleep study. I had a long chat with the patient about my findings and the diagnosis of OSA, its prognosis and treatment options. We talked about medical treatments, surgical interventions and non-pharmacological approaches. I explained in particular the risks and ramifications of untreated moderate to severe OSA, especially with respect to developing cardiovascular disease down the Road, including congestive heart failure, difficult to treat hypertension, cardiac arrhythmias, or stroke. Even type 2 diabetes has, in part, been linked to untreated OSA. Symptoms of untreated OSA include daytime sleepiness, memory problems, mood irritability and mood disorder such as depression and anxiety, lack of energy, as well as recurrent headaches, especially morning headaches. We talked about trying to maintain a healthy lifestyle in general, as well as the importance of weight control. I encouraged the patient to eat healthy, exercise daily and keep well hydrated, to keep a scheduled bedtime and wake time routine, to not skip any meals and eat healthy snacks in between meals. I advised the patient not to drive when feeling sleepy. I recommended the following at this time: sleep study with potential positive airway pressure titration. (We will score hypopneas at 3%).   I explained the sleep test procedure to the  patient and also outlined possible surgical and non-surgical treatment options of OSA, including the use of a custom-made dental device (which would require a referral to a specialist dentist or oral surgeon), upper airway surgical options, such as pillar implants, radiofrequency surgery, tongue base surgery, and UPPP (which would involve a referral to an ENT surgeon). Rarely, jaw surgery such as mandibular advancement may be considered.  I also explained the CPAP treatment option to the patient, who indicated that he would be willing to try CPAP if the need arises. I explained the importance of being compliant with PAP treatment, not only for insurance purposes but primarily to improve His symptoms, and for the patient's long term health benefit, including to reduce His cardiovascular risks. I answered all his questions today and the patient was in agreement. I would like to see him back after the sleep study is completed and encouraged him to call with any interim questions, concerns, problems or updates.   Thank you very much for allowing me to participate in the care of this nice patient. If I can be of any further assistance to you please do not hesitate to call me at [phone].  Sincerely,   Huston Foley, MD, PhD

## 2018-09-07 DIAGNOSIS — F4320 Adjustment disorder, unspecified: Secondary | ICD-10-CM | POA: Diagnosis not present

## 2018-09-08 ENCOUNTER — Ambulatory Visit (INDEPENDENT_AMBULATORY_CARE_PROVIDER_SITE_OTHER): Payer: BLUE CROSS/BLUE SHIELD | Admitting: Physician Assistant

## 2018-09-08 ENCOUNTER — Encounter: Payer: Self-pay | Admitting: Physician Assistant

## 2018-09-08 DIAGNOSIS — F411 Generalized anxiety disorder: Secondary | ICD-10-CM

## 2018-09-08 DIAGNOSIS — F331 Major depressive disorder, recurrent, moderate: Secondary | ICD-10-CM

## 2018-09-08 DIAGNOSIS — F422 Mixed obsessional thoughts and acts: Secondary | ICD-10-CM

## 2018-09-08 MED ORDER — CLONAZEPAM 1 MG PO TABS: 1.0000 mg | ORAL_TABLET | Freq: Three times a day (TID) | ORAL | 1 refills | Status: DC | PRN

## 2018-09-08 MED ORDER — SERTRALINE HCL 100 MG PO TABS: ORAL_TABLET | ORAL | 1 refills | Status: DC

## 2018-09-08 NOTE — Progress Notes (Signed)
Robert Mcpherson [id number] 1976-03-08 42 y.o.  Subjective:   Patient ID:  Robert Mcpherson is a 42 y.o. (DOB December 11, 1975) male.  Chief Complaint:  Chief Complaint  Patient presents with  . Follow-up  15 minutes was spent with the patient  HPI Garvey HAITHAM DOLINSKY presents to the office today for follow-up of OCD, depression, anxiety, and insomnia.    States that he is no longer taking the trazodone and is now using better sleep hygiene in the evening and is able to sleep more soundly without the medication.  He is currently being worked up for sleep apnea as well.  His blood pressure has been running high.  He was on losartan that because it made the erectile dysfunction even worse than just he was on the Viibryd alone.  He stopped the losartan and is following up with his PCP concerning that.  He does continue to have ED.  The Viibryd has made it worse.  He tried Cialis and it did help.  However when the losartan was added, he ended up taking 2 Cialis pills and did not help at all.  He also does not feel the Viibryd is helping with anxiety or the OCD and since his not working he would like to go off of it and try something else if possible.  Did well in the past when he took Zoloft but it did prevent him from having any emotions at all.  He had no sexual side effects from that and it really did help with the OCD.  He does continue to have anxiety.  The Klonopin really helps a lot.  He rarely has panic attacks now the Klonopin just helps to keep him calm.  Review of Systems:  Review of Systems  Neurological: Negative.   Psychiatric/Behavioral: Positive for sleep disturbance. Negative for agitation, behavioral problems, confusion, decreased concentration, dysphoric mood, hallucinations, self-injury and suicidal ideas. The patient is nervous/anxious. The patient is not hyperactive.     Medications: I have reviewed the patient's current medications.  Current Outpatient Medications  Medication Sig  Dispense Refill  . APPLE CIDER VINEGAR PO Take by mouth.    . Ascorbic Acid (VITAMIN C) 1000 MG tablet Take 1,000 mg by mouth 3 (three) times daily.    . clonazePAM (KLONOPIN) 1 MG tablet Take 1 tablet (1 mg total) by mouth 3 (three) times daily as needed for anxiety. 270 tablet 1  . Milk Thistle 1000 MG CAPS milk thistle    . NON FORMULARY Milk thistle   daily    . losartan (COZAAR) 50 MG tablet Take 1 tablet (50 mg total) by mouth daily. (Patient not taking: Reported on 09/08/2018) 30 tablet 3  . sertraline (ZOLOFT) 100 MG tablet 1/2 pill qd for 2 wks, then 1 pill 30 tablet 1   No current facility-administered medications for this visit.    Past medications for mental health diagnoses include: Seroquel, Prozac, Klonopin, Trintellix, Zoloft, Luvox, Viibryd, Ambien, trazodone  Medication Side Effects: Other: ED  Allergies:  Allergies  Allergen Reactions  . Cephalexin Hives    Past Medical History:  Diagnosis Date  . Anxiety   . Dental crown present   . Depression   . Deviated nasal septum 09/2013  . High triglycerides   . 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 Nasal turbinate hypertrophy 09/2013  . OCD (obsessive compulsive disorder) 04/02/2014    Family History  Problem Relation Age of Onset  . Heart disease Maternal Uncle 59  heart attack  . Heart disease Maternal Uncle 61       CVA  . Colon cancer Neg Hx   . Colon polyps Neg Hx   . Liver disease Neg Hx     Social History   Socioeconomic History  . Marital status: Married    Spouse name: Not on file  . Number of children: Not on file  . Years of education: Not on file  . Highest education level: Not on file  Occupational History  . Not on file  Social Needs  . Financial resource strain: Not on file  . Food insecurity:    Worry: Not on file    Inability: Not on file  . Transportation needs:    Medical: Not on file    Non-medical: Not on file  Tobacco Use  . Smoking status: Never Smoker  . Smokeless  tobacco: Never Used  Substance and Sexual Activity  . Alcohol use: No    Comment: history of alcohol use in his 21s, none now   . Drug use: No  . Sexual activity: Yes  Lifestyle  . Physical activity:    Days per week: Not on file    Minutes per session: Not on file  . Stress: Not on file  Relationships  . Social connections:    Talks on phone: Not on file    Gets together: Not on file    Attends religious service: Not on file    Active member of club or organization: Not on file    Attends meetings of clubs or organizations: Not on file    Relationship status: Not on file  . Intimate partner violence:    Fear of current or ex partner: Not on file    Emotionally abused: Not on file    Physically abused: Not on file    Forced sexual activity: Not on file  Other Topics Concern  . Not on file  Social History Narrative  . Not on file    Past Medical History, Surgical history, Social history, and Family history were reviewed and updated as appropriate.   Please see review of systems for further details on the patient's review from today.   Objective:   Physical Exam:  There were no vitals taken for this visit.  Physical Exam  Constitutional: He is oriented to person, place, and time. He appears well-developed.  Neurological: He is alert and oriented to person, place, and time.  Psychiatric: He has a normal mood and affect. His speech is normal and behavior is normal. Judgment and thought content normal. He is not actively hallucinating. Cognition and memory are normal. He is attentive.    Lab Review:     Component Value Date/Time   NA 140 11/29/2017 1156   K 4.5 11/29/2017 1156   CL 105 11/29/2017 1156   CO2 26 11/29/2017 1156   GLUCOSE 83 11/29/2017 1156   BUN 19 11/29/2017 1156   CREATININE 0.94 11/29/2017 1156   CREATININE 1.07 08/25/2016 0950   CALCIUM 9.9 11/29/2017 1156   PROT 7.3 11/29/2017 1156   ALBUMIN 4.6 11/29/2017 1156   AST 52 (H) 11/29/2017 1156    ALT 136 (H) 11/29/2017 1156   ALKPHOS 51 11/29/2017 1156   BILITOT 0.9 11/29/2017 1156   GFRNONAA >60 11/29/2017 1156   GFRNONAA 87 08/25/2016 0950   GFRAA >60 11/29/2017 1156   GFRAA >89 08/25/2016 0950       Component Value Date/Time   WBC 8.2 11/29/2017 1156  RBC 5.53 11/29/2017 1156   HGB 17.2 (H) 11/29/2017 1156   HCT 48.7 11/29/2017 1156   PLT 226 11/29/2017 1156   MCV 88.1 11/29/2017 1156   MCV 92.3 10/13/2014 1426   MCH 31.1 11/29/2017 1156   MCHC 35.3 11/29/2017 1156   RDW 13.1 11/29/2017 1156   LYMPHSABS 0.9 01/21/2017 0042   MONOABS 0.5 01/21/2017 0042   EOSABS 0.0 01/21/2017 0042   BASOSABS 0.0 01/21/2017 0042    No results found for: POCLITH, LITHIUM   No results found for: PHENYTOIN, PHENOBARB, VALPROATE, CBMZ   .res Assessment: Plan:    Mixed obsessional thoughts and acts  Generalized anxiety disorder  Major depressive disorder, recurrent episode, moderate (HCC)  Please see After Visit Summary for patient specific instructions.  Future Appointments  Date Time Provider Department Center  10/07/2018  9:00 AM Gelene Mink, NP [facility]  10/20/2018 11:00 AM Melony Overly T, PA-C [facility] None    No orders of the defined types were placed in this encounter. Plan he will wean off Viibryd by cutting the 20 mg in half and take that for 1 week and then stop. Start the Zoloft back at 50 mg for 2 weeks and then go up to 100 mg.  We will try to keep this at a lower dose and hopefully he will not get the lack of a motion like he has had in the past nor the sexual side effects.  And hopefully it will give [place] good treatment for the OCD, anxiety, and depression. Continue Klonopin. Return in 6 weeks.   Melony Overly, PA-C -------------------------------

## 2018-09-13 DIAGNOSIS — H938X1 Other specified disorders of right ear: Secondary | ICD-10-CM | POA: Insufficient documentation

## 2018-09-13 DIAGNOSIS — R0683 Snoring: Secondary | ICD-10-CM | POA: Insufficient documentation

## 2018-09-13 DIAGNOSIS — J342 Deviated nasal septum: Secondary | ICD-10-CM | POA: Diagnosis not present

## 2018-09-13 DIAGNOSIS — J351 Hypertrophy of tonsils: Secondary | ICD-10-CM | POA: Insufficient documentation

## 2018-09-13 HISTORY — DX: Other specified disorders of right ear: H93.8X1

## 2018-09-13 HISTORY — DX: Snoring: R06.83

## 2018-09-14 DIAGNOSIS — F4320 Adjustment disorder, unspecified: Secondary | ICD-10-CM | POA: Diagnosis not present

## 2018-09-21 DIAGNOSIS — F4320 Adjustment disorder, unspecified: Secondary | ICD-10-CM | POA: Diagnosis not present

## 2018-09-26 ENCOUNTER — Telehealth: Payer: Self-pay | Admitting: Neurology

## 2018-09-26 NOTE — Telephone Encounter (Signed)
Patient can't come tonight for apt because he is out of town. Patient's wife stated any other good  be good to call.

## 2018-10-05 ENCOUNTER — Ambulatory Visit (INDEPENDENT_AMBULATORY_CARE_PROVIDER_SITE_OTHER): Payer: BLUE CROSS/BLUE SHIELD | Admitting: Neurology

## 2018-10-05 DIAGNOSIS — E669 Obesity, unspecified: Secondary | ICD-10-CM

## 2018-10-05 DIAGNOSIS — G4733 Obstructive sleep apnea (adult) (pediatric): Secondary | ICD-10-CM | POA: Diagnosis not present

## 2018-10-05 DIAGNOSIS — G4720 Circadian rhythm sleep disorder, unspecified type: Secondary | ICD-10-CM

## 2018-10-05 DIAGNOSIS — R030 Elevated blood-pressure reading, without diagnosis of hypertension: Secondary | ICD-10-CM

## 2018-10-05 DIAGNOSIS — R51 Headache: Secondary | ICD-10-CM

## 2018-10-05 DIAGNOSIS — R635 Abnormal weight gain: Secondary | ICD-10-CM

## 2018-10-05 DIAGNOSIS — R0789 Other chest pain: Secondary | ICD-10-CM

## 2018-10-05 DIAGNOSIS — G4719 Other hypersomnia: Secondary | ICD-10-CM

## 2018-10-05 DIAGNOSIS — R519 Headache, unspecified: Secondary | ICD-10-CM

## 2018-10-07 ENCOUNTER — Ambulatory Visit: Payer: BLUE CROSS/BLUE SHIELD | Admitting: Gastroenterology

## 2018-10-07 ENCOUNTER — Telehealth: Payer: Self-pay

## 2018-10-07 ENCOUNTER — Encounter: Payer: Self-pay | Admitting: Family Medicine

## 2018-10-07 DIAGNOSIS — G4730 Sleep apnea, unspecified: Secondary | ICD-10-CM | POA: Insufficient documentation

## 2018-10-07 NOTE — Addendum Note (Signed)
Addended by: Huston FoleyATHAR, Rich Paprocki on: 10/07/2018 10:00 AM   Modules accepted: Orders

## 2018-10-07 NOTE — Progress Notes (Signed)
Patient referred by Dr. Tanya NonesPickard, seen by me on 09/06/18, HST on 10/05/18.    Please call and notify the patient that the recent home sleep test showed obstructive sleep apnea in the severe range. While I recommend treatment for this in the form CPAP, his insurance will not approve a sleep study for this. They will likely only approve a trial of autoPAP, which means, that we don't have to bring him in for a sleep study with CPAP, but will let him try an autoPAP machine at home, through a DME company (of his choice, or as per insurance requirement). The DME representative will educate him on how to use the machine, how to put the mask on, etc. I have placed an order in the chart. Please send referral, talk to patient, send report to referring MD. We will need a FU in sleep clinic for 10 weeks post-PAP set up, please arrange that with me or one of our NPs. Thanks,   Huston FoleySaima Sunny Aguon, MD, PhD [facility]

## 2018-10-07 NOTE — Procedures (Signed)
[facility] [address] [place], [place] [id number] NAME: Robert SpainHobert Mcpherson                                                                DOB: 25-Sep-1976 MEDICAL RECORD no:  [id number]                                      DOS:  10/05/2018 REFERRING PHYSICIAN: Lynnea FerrierWarren Pickard, MD STUDY PERFORMED: Home Sleep Test on Watch Pat HISTORY: 42 year old man with a history of hypertriglyceridemia, OCD, anxiety, depression, s/p lumbar laminectomy, septoplasty and turbinate reduction, shoulder arthroscopic surgery, and obesity, who reports snoring and excessive daytime somnolence. Epworth sleepiness score: 10 out of 24, BMI of 38.5.  STUDY RESULTS:   Total Recording Time: 10 hr, 4 min, Total Sleep time: 9hr 24min Total Apnea/Hypopnea Index (AHI):  57.5 /h        RDI: 58.3 /h Average Oxygen Saturation:   93 %         Lowest Oxygen Desaturation:  76 %  Total Time Oxygen Saturation Below or at 88%: 42.5 minutes  Average Heart Rate:  77 bpm (between 55 and 116 bpm) IMPRESSION: Severe OSA RECOMMENDATION: This home sleep test demonstrates severe obstructive sleep apnea with a total AHI of 57.5/hour and O2 nadir of 76%. Given the patient's medical history and sleep related complaints, treatment with positive airway pressure (in the form of CPAP) is recommended. This will require a full night CPAP titration study for proper treatment settings, O2 monitoring and mask fitting. Based on the severity of the sleep disordered breathing an attended titration study is indicated. However, patient's insurance has denied an attended sleep study; therefore, the patient will be advised to proceed with an autoPAP titration/trial at home for now. Please note that untreated obstructive sleep apnea may carry additional perioperative morbidity. Patients with significant obstructive sleep apnea should receive perioperative PAP therapy and the surgeons and particularly the anesthesiologist should be informed  of the diagnosis and the severity of the sleep disordered breathing. The patient should be cautioned not to drive, work at heights, or operate dangerous or heavy equipment when tired or sleepy. Review and reiteration of good sleep hygiene measures should be pursued with any patient. Other causes of the patient's symptoms, including circadian rhythm disturbances, an underlying mood disorder, medication effect and/or an underlying medical problem cannot be ruled out based on this test. Clinical correlation is recommended. The patient and his referring provider will be notified of the test results. [facility]. I certify that I have reviewed the raw data recording prior to the issuance of this report in accordance with the standards of the American Academy of Sleep Medicine (AASM).  Huston FoleySaima Tarig Zimmers, MD, PhD [facility] ([facility]), Diplomat, ABPN (Neurology and Sleep)

## 2018-10-07 NOTE — Telephone Encounter (Signed)
Left message for patient to call to go over sleep study results. I let him know results were in epic for his ENT doctor to see.

## 2018-10-10 ENCOUNTER — Telehealth: Payer: Self-pay

## 2018-10-10 NOTE — Telephone Encounter (Signed)
I called pt to discuss his auto pap follow up appt which needs to be scheudled 31-90 days after auto pap start. Pt's phone number goes to VM and a message is there saying that pt will not check messages until after January of 2020. Will send a mychart message to pt.

## 2018-10-10 NOTE — Telephone Encounter (Signed)
Spoke with patient regarding the results of his sleep study. Explained that he has severe sleep apnea. An order would be sent to [facility] for his cpap machine and he would hear from them this week after insurance approval.I explained the process.  I explained that his ENT doctor can see the results in Epic. He will be scheduling a tonsillectomy.

## 2018-10-12 DIAGNOSIS — F4320 Adjustment disorder, unspecified: Secondary | ICD-10-CM | POA: Diagnosis not present

## 2018-10-18 DIAGNOSIS — G4733 Obstructive sleep apnea (adult) (pediatric): Secondary | ICD-10-CM | POA: Diagnosis not present

## 2018-10-19 DIAGNOSIS — G4730 Sleep apnea, unspecified: Secondary | ICD-10-CM | POA: Insufficient documentation

## 2018-10-19 DIAGNOSIS — J029 Acute pharyngitis, unspecified: Secondary | ICD-10-CM | POA: Diagnosis not present

## 2018-10-19 DIAGNOSIS — F4320 Adjustment disorder, unspecified: Secondary | ICD-10-CM | POA: Diagnosis not present

## 2018-10-19 DIAGNOSIS — B349 Viral infection, unspecified: Secondary | ICD-10-CM | POA: Diagnosis not present

## 2018-10-19 DIAGNOSIS — M546 Pain in thoracic spine: Secondary | ICD-10-CM | POA: Diagnosis not present

## 2018-10-19 DIAGNOSIS — R05 Cough: Secondary | ICD-10-CM | POA: Diagnosis not present

## 2018-10-20 ENCOUNTER — Ambulatory Visit: Payer: BLUE CROSS/BLUE SHIELD | Admitting: Physician Assistant

## 2018-11-11 ENCOUNTER — Other Ambulatory Visit: Payer: Self-pay | Admitting: Physician Assistant

## 2018-11-18 DIAGNOSIS — G4733 Obstructive sleep apnea (adult) (pediatric): Secondary | ICD-10-CM | POA: Diagnosis not present

## 2018-11-24 ENCOUNTER — Ambulatory Visit: Payer: BLUE CROSS/BLUE SHIELD | Admitting: Physician Assistant

## 2018-11-30 DIAGNOSIS — F4320 Adjustment disorder, unspecified: Secondary | ICD-10-CM | POA: Diagnosis not present

## 2018-12-09 ENCOUNTER — Telehealth: Payer: Self-pay | Admitting: Physician Assistant

## 2018-12-09 ENCOUNTER — Other Ambulatory Visit: Payer: Self-pay

## 2018-12-09 ENCOUNTER — Other Ambulatory Visit: Payer: Self-pay | Admitting: Physician Assistant

## 2018-12-09 MED ORDER — CLONAZEPAM 1 MG PO TABS: 1.0000 mg | ORAL_TABLET | Freq: Three times a day (TID) | ORAL | 1 refills | Status: DC | PRN

## 2018-12-09 NOTE — Telephone Encounter (Signed)
Patien;s wife called and said that her husband needs refills on 1 mg  klonopin and zoloft 100 mg  needs to be escribed to the [facility] on [address]

## 2018-12-09 NOTE — Telephone Encounter (Signed)
Refill for zoloft submitted but clonazepam has refills for a 90 day already will verify with pharmacy

## 2018-12-10 ENCOUNTER — Other Ambulatory Visit: Payer: Self-pay | Admitting: Physician Assistant

## 2018-12-19 DIAGNOSIS — G4733 Obstructive sleep apnea (adult) (pediatric): Secondary | ICD-10-CM | POA: Diagnosis not present

## 2018-12-22 ENCOUNTER — Ambulatory Visit: Payer: BLUE CROSS/BLUE SHIELD | Admitting: Physician Assistant

## 2019-01-09 NOTE — Progress Notes (Deleted)
[facility]  PATIENT: Robert Mcpherson DOB: 07/15/1976   REASON FOR VISIT: Follow-up for recently diagnosed severe obstructive sleep apnea with initial CPAP HISTORY FROM:    HISTORY OF PRESENT ILLNESS: 11/5/19[name] is a 43 year old right-handed gentleman with an underlying medical history of hypertriglyceridemia, OCD, anxiety, depression, recent status post lumbar laminectomy, status post septoplasty and turbinate reduction, shoulder arthroscopic surgery, and history of obesity, who reports snoring and excessive daytime somnolence. I reviewed your office note from 06/10/2018. He had a sleep study 5 years ago which showed mild sleep apnea. I reviewed his PSG report from 09/11/2013, study was interpreted by Dr. Maple Hudson. Of note, his weight was recorded to be 200 tablet at the time, BMI of 31. Overall AHI was 11 per hour. He achieved all stages of sleep with 15.9% of REM sleep, sleep latency was 45 minutes, REM latency 78 minutes. Average oxygen saturation was 93.2%, nadir was 83%. No significant PLMs. His Epworth sleepiness score is 10 out of 24 today, fatigue score is 56 out of 56 (one question was not answered, so total score is out of 56, not 63). Is married and lives with his wife and children. He has a total of 4 children and is self-employed, Radio broadcast assistant. He is a nonsmoker and drinks alcohol occasionally, maybe twice a month or so and drinks caffeine daily in the form of iced coffee about 2 per day, large servings, which helps him stay alert during the day. He reports significant weight gain secondary to back pain. He has a family history of sleep apnea and has several uncles and cousins on CPAP machines. He would be willing to try CPAP. He did not have much in the way of improvement of his nasal breathing after nasal surgery. He does not keep a very set schedule for sleep. He reports sleeping some during the day and also somewhat night. Typical bedtime is after  midnight, typically before 2 AM. Rise time is around 8. He takes a 3-4 hour nap every day around 1 or 2 PM. He has 4 younger children, ages 97, 56, 69, and for. They are all homeschooled. He denies night to night nocturia but has had morning headaches. He has had mood irritability especially when he is sleep deprived or not sleeping well.  REVIEW OF SYSTEMS: Full 14 system review of systems performed and notable only for those listed, all others are neg:  Constitutional: neg  Cardiovascular: neg Ear/Nose/Throat: neg  Skin: neg Eyes: neg Respiratory: neg Gastroitestinal: neg  Hematology/Lymphatic: neg  Endocrine: neg Musculoskeletal:neg Allergy/Immunology: neg Neurological: neg Psychiatric: neg Sleep : neg   ALLERGIES: Allergies  Allergen Reactions  . Cephalexin Hives    HOME MEDICATIONS: Outpatient Medications Prior to Visit  Medication Sig Dispense Refill  . APPLE CIDER VINEGAR PO Take by mouth.    . Ascorbic Acid (VITAMIN C) 1000 MG tablet Take 1,000 mg by mouth 3 (three) times daily.    . clonazePAM (KLONOPIN) 1 MG tablet Take 1 tablet (1 mg total) by mouth 3 (three) times daily as needed for anxiety. 270 tablet 1  . losartan (COZAAR) 50 MG tablet Take 1 tablet (50 mg total) by mouth daily. (Patient not taking: Reported on 09/08/2018) 30 tablet 3  . Milk Thistle 1000 MG CAPS milk thistle    . NON FORMULARY Milk thistle   daily    . sertraline (ZOLOFT) 100 MG tablet TAKE 1 TABLET BY MOUTH DAILY 30 tablet 0   No facility-administered medications prior  to visit.     PAST MEDICAL HISTORY: Past Medical History:  Diagnosis Date  . Anxiety   . Dental crown present   . Depression   . Deviated nasal septum 09/2013  . High triglycerides   . 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 Nasal turbinate hypertrophy 09/2013  . OCD (obsessive compulsive disorder) 04/02/2014  . Sleep apnea     PAST SURGICAL HISTORY: Past Surgical History:  Procedure Laterality Date  . APPENDECTOMY    .  LUMBAR LAMINECTOMY/DECOMPRESSION MICRODISCECTOMY Left 12/02/2017   Procedure: Left Lumbar 4-5 disectomy;  Surgeon: Venita Lick, MD;  Location: [facility] OR;  Service: Orthopedics;  Laterality: Left;  2 hrs  . NASAL SEPTOPLASTY W/ TURBINOPLASTY Bilateral 10/02/2013   Procedure: BILATERAL NASAL SEPTOPLASTY WITH TURBINATE REDUCTION;  Surgeon: Serena Colonel, MD;  Location: [facility];  Service: ENT;  Laterality: Bilateral;  . SHOULDER ARTHROSCOPY Right 11/2013  . VASECTOMY  06/02/2013    FAMILY HISTORY: Family History  Problem Relation Age of Onset  . Heart disease Maternal Uncle 52       heart attack  . Heart disease Maternal Uncle 61       CVA  . Colon cancer Neg Hx   . Colon polyps Neg Hx   . Liver disease Neg Hx     SOCIAL HISTORY: Social History   Socioeconomic History  . Marital status: Married    Spouse name: Not on file  . Number of children: Not on file  . Years of education: Not on file  . Highest education level: Not on file  Occupational History  . Not on file  Social Needs  . Financial resource strain: Not on file  . Food insecurity:    Worry: Not on file    Inability: Not on file  . Transportation needs:    Medical: Not on file    Non-medical: Not on file  Tobacco Use  . Smoking status: Never Smoker  . Smokeless tobacco: Never Used  Substance and Sexual Activity  . Alcohol use: No    Comment: history of alcohol use in his 83s, none now   . Drug use: No  . Sexual activity: Yes  Lifestyle  . Physical activity:    Days per week: Not on file    Minutes per session: Not on file  . Stress: Not on file  Relationships  . Social connections:    Talks on phone: Not on file    Gets together: Not on file    Attends religious service: Not on file    Active member of club or organization: Not on file    Attends meetings of clubs or organizations: Not on file    Relationship status: Not on file  . Intimate partner violence:    Fear of current or ex  partner: Not on file    Emotionally abused: Not on file    Physically abused: Not on file    Forced sexual activity: Not on file  Other Topics Concern  . Not on file  Social History Narrative  . Not on file     PHYSICAL EXAM  There were no vitals filed for this visit. There is no height or weight on file to calculate BMI.  Generalized: Well developed, in no acute distress  Head: normocephalic and atraumatic,. Oropharynx benign  Neck: Supple, no carotid bruits  Cardiac: Regular rate rhythm, no murmur  Musculoskeletal: No deformity   Neurological examination   Mentation: Alert oriented  to time, place, history taking. Attention span and concentration appropriate. Recent and remote memory intact.  Follows all commands speech and language fluent.   Cranial nerve II-XII: Fundoscopic exam reveals sharp disc margins.Pupils were equal round reactive to light extraocular movements were full, visual field were full on confrontational test. Facial sensation and strength were normal. hearing was intact to finger rubbing bilaterally. Uvula tongue midline. head turning and shoulder shrug were normal and symmetric.Tongue protrusion into cheek strength was normal. Motor: normal bulk and tone, full strength in the BUE, BLE, fine finger movements normal, no pronator drift. No focal weakness Sensory: normal and symmetric to light touch, pinprick, and  Vibration, proprioception  Coordination: finger-nose-finger, heel-to-shin bilaterally, no dysmetria Reflexes: Brachioradialis 2/2, biceps 2/2, triceps 2/2, patellar 2/2, Achilles 2/2, plantar responses were flexor bilaterally. Gait and Station: Rising up from seated position without assistance, normal stance,  moderate stride, good arm swing, smooth turning, able to perform tiptoe, and heel walking without difficulty. Tandem gait is steady  DIAGNOSTIC DATA (LABS, IMAGING, TESTING) - I reviewed patient records, labs, notes, testing and imaging myself where  available.  Lab Results  Component Value Date   WBC 8.2 11/29/2017   HGB 17.2 (H) 11/29/2017   HCT 48.7 11/29/2017   MCV 88.1 11/29/2017   PLT 226 11/29/2017      Component Value Date/Time   NA 140 11/29/2017 1156   K 4.5 11/29/2017 1156   CL 105 11/29/2017 1156   CO2 26 11/29/2017 1156   GLUCOSE 83 11/29/2017 1156   BUN 19 11/29/2017 1156   CREATININE 0.94 11/29/2017 1156   CREATININE 1.07 08/25/2016 0950   CALCIUM 9.9 11/29/2017 1156   PROT 7.3 11/29/2017 1156   ALBUMIN 4.6 11/29/2017 1156   AST 52 (H) 11/29/2017 1156   ALT 136 (H) 11/29/2017 1156   ALKPHOS 51 11/29/2017 1156   BILITOT 0.9 11/29/2017 1156   GFRNONAA >60 11/29/2017 1156   GFRNONAA 87 08/25/2016 0950   GFRAA >60 11/29/2017 1156   GFRAA >89 08/25/2016 0950   Lab Results  Component Value Date   CHOL 213 (H) 08/25/2016   HDL 28 (L) 08/25/2016   LDLCALC 109 08/25/2016   TRIG 381 (H) 08/25/2016   CHOLHDL 7.6 (H) 08/25/2016   No results found for: HGBA1C No results found for: VITAMINB12 Lab Results  Component Value Date   TSH 1.437 02/22/2013      ASSESSMENT AND PLAN  43 y.o. year old male  has a past medical history of Anxiety, Dental crown present, Depression, Deviated nasal septum (09/2013), High triglycerides, Lumbar disc herniation, Nasal turbinate hypertrophy (09/2013), OCD (obsessive compulsive disorder) (04/02/2014), and Sleep apnea. here with ***  , Lamarcus STANISLAV GERVASE is a very pleasant 43 y.o.-year old male with an underlying medical history of hypertriglyceridemia, OCD, anxiety, depression, recent status post lumbar laminectomy, status post septoplasty and turbinate reduction, shoulder arthroscopic surgery, and history of obesity, who presents for reevaluation of his obstructive sleep apnea. He had sleep study testing 5 years ago which indicated mild sleep apnea but unfortunately he had significant weight gain in the interim in the realm of 50 pounds. He would benefit from reevaluation with a sleep  study. I had a long chat with the patient about my findings and the diagnosis of OSA, its prognosis and treatment options. We talked about medical treatments, surgical interventions and non-pharmacological approaches. I explained in particular the risks and ramifications of untreated moderate to severe OSA, especially with respect to developing cardiovascular disease down  the Road, including congestive heart failure, difficult to treat hypertension  Nilda Riggs, [facility], [facility], APRN  [facility] [address], [address] [place], [place] [address] [phone]

## 2019-01-10 ENCOUNTER — Ambulatory Visit: Payer: Self-pay | Admitting: Nurse Practitioner

## 2019-01-10 NOTE — Progress Notes (Addendum)
[facility]  PATIENT: Robert Mcpherson DOB: 06-10-1976   REASON FOR VISIT: Follow-up for recently diagnosed severe obstructive sleep apnea with initial CPAP HISTORY FROM: Patient    HISTORY OF PRESENT ILLNESS:UPDATE 3/11/2020CM Robert Mcpherson, 43 year old male returns for follow-up with newly diagnosed severe obstructive sleep apnea here for his initial CPAP.  Patient claims he feels so much better, he is well rested every morning and he is sleeping so much better.  CPAP data dated 12/10/2018-01/08/2019 shows compliance greater than 4 hours at 100%.  Average usage 7 hours 4 minutes.  Set pressure 7 to 15 cm.  EPR level 1 leak 95th percentile 1.4.  AHI 0.7 ESS 4.  He returns for reevaluation.   11/5/19[name] is a 43 year old right-handed gentleman with an underlying medical history of hypertriglyceridemia, OCD, anxiety, depression, recent status post lumbar laminectomy, status post septoplasty and turbinate reduction, shoulder arthroscopic surgery, and history of obesity, who reports snoring and excessive daytime somnolence. I reviewed your office note from 06/10/2018. He had a sleep study 5 years ago which showed mild sleep apnea. I reviewed his PSG report from 09/11/2013, study was interpreted by Dr. Maple Mcpherson. Of note, his weight was recorded to be 200 tablet at the time, BMI of 31. Overall AHI was 11 per hour. He achieved all stages of sleep with 15.9% of REM sleep, sleep latency was 45 minutes, REM latency 78 minutes. Average oxygen saturation was 93.2%, nadir was 83%. No significant PLMs. His Epworth sleepiness score is 10 out of 24 today, fatigue score is 56 out of 56 (one question was not answered, so total score is out of 56, not 63). Is married and lives with his wife and children. He has a total of 4 children and is self-employed, Radio broadcast assistant. He is a nonsmoker and drinks alcohol occasionally, maybe twice a month or so and drinks caffeine daily in the form of iced coffee  about 2 per day, large servings, which helps him stay alert during the day. He reports significant weight gain secondary to back pain. He has a family history of sleep apnea and has several uncles and cousins on CPAP machines. He would be willing to try CPAP. He did not have much in the way of improvement of his nasal breathing after nasal surgery. He does not keep a very set schedule for sleep. He reports sleeping some during the day and also somewhat night. Typical bedtime is after midnight, typically before 2 AM. Rise time is around 8. He takes a 3-4 hour nap every day around 1 or 2 PM. He has 4 younger children, ages 21, 80, 60, and for. They are all homeschooled. He denies night to night nocturia but has had morning headaches. He has had mood irritability especially when he is sleep deprived or not sleeping well.  REVIEW OF SYSTEMS: Full 14 system review of systems performed and notable only for those listed, all others are neg:  Constitutional: neg  Cardiovascular: neg Ear/Nose/Throat: neg  Skin: neg Eyes: neg Respiratory: neg Gastroitestinal: neg  Hematology/Lymphatic: neg  Endocrine: neg Musculoskeletal:neg Allergy/Immunology: neg Neurological: neg Psychiatric: neg Sleep : Obstructive sleep apnea with CPAP   ALLERGIES: Allergies  Allergen Reactions  . Cephalexin Hives    HOME MEDICATIONS: Outpatient Medications Prior to Visit  Medication Sig Dispense Refill  . APPLE CIDER VINEGAR PO Take by mouth. Raw ASV 150    . Ascorbic Acid (VITAMIN C) 1000 MG tablet Take 1,000 mg by mouth 3 (three) times daily.    Marland Kitchen  Cholecalciferol ([employer]S VIT D 5000 HIGH-POTENCY PO) Take by mouth. One daily    . clonazePAM (KLONOPIN) 1 MG tablet Take 1 tablet (1 mg total) by mouth 3 (three) times daily as needed for anxiety. 270 tablet 1  . Milk Thistle 150 MG CAPS Take by mouth. Takes one daily    . sertraline (ZOLOFT) 100 MG tablet TAKE 1 TABLET BY MOUTH DAILY 30 tablet 0  . Turmeric 400 MG CAPS Take by  mouth. Takes one daily    . Milk Thistle 1000 MG CAPS milk thistle    . losartan (COZAAR) 50 MG tablet Take 1 tablet (50 mg total) by mouth daily. (Patient not taking: Reported on 01/11/2019) 30 tablet 3  . NON FORMULARY Milk thistle   daily     No facility-administered medications prior to visit.     PAST MEDICAL HISTORY: Past Medical History:  Diagnosis Date  . Anxiety   . Dental crown present   . Depression   . Deviated nasal septum 09/2013  . High triglycerides   . 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 Nasal turbinate hypertrophy 09/2013  . OCD (obsessive compulsive disorder) 04/02/2014  . Sleep apnea     PAST SURGICAL HISTORY: Past Surgical History:  Procedure Laterality Date  . APPENDECTOMY    . LUMBAR LAMINECTOMY/DECOMPRESSION MICRODISCECTOMY Left 12/02/2017   Procedure: Left Lumbar 4-5 disectomy;  Surgeon: Robert Lick, MD;  Location: [facility];  Service: Orthopedics;  Laterality: Left;  2 hrs  . NASAL SEPTOPLASTY W/ TURBINOPLASTY Bilateral 10/02/2013   Procedure: BILATERAL NASAL SEPTOPLASTY WITH TURBINATE REDUCTION;  Surgeon: Robert Colonel, MD;  Location: [facility];  Service: ENT;  Laterality: Bilateral;  . SHOULDER ARTHROSCOPY Right 11/2013  . VASECTOMY  06/02/2013    FAMILY HISTORY: Family History  Problem Relation Age of Onset  . Heart disease Maternal Uncle 52       heart attack  . Heart disease Maternal Uncle 61       CVA  . Colon cancer Neg Hx   . Colon polyps Neg Hx   . Liver disease Neg Hx     SOCIAL HISTORY: Social History   Socioeconomic History  . Marital status: Married    Spouse name: Not on file  . Number of children: Not on file  . Years of education: Not on file  . Highest education level: Not on file  Occupational History  . Not on file  Social Needs  . Financial resource strain: Not on file  . Food insecurity:    Worry: Not on file    Inability: Not on file  . Transportation needs:    Medical: Not on file    Non-medical: Not  on file  Tobacco Use  . Smoking status: Never Smoker  . Smokeless tobacco: Never Used  Substance and Sexual Activity  . Alcohol use: No    Comment: history of alcohol use in his 49s, none now   . Drug use: No  . Sexual activity: Yes  Lifestyle  . Physical activity:    Days per week: Not on file    Minutes per session: Not on file  . Stress: Not on file  Relationships  . Social connections:    Talks on phone: Not on file    Gets together: Not on file    Attends religious service: Not on file    Active member of club or organization: Not on file    Attends meetings of clubs or organizations:  Not on file    Relationship status: Not on file  . Intimate partner violence:    Fear of current or ex partner: Not on file    Emotionally abused: Not on file    Physically abused: Not on file    Forced sexual activity: Not on file  Other Topics Concern  . Not on file  Social History Narrative  . Not on file     PHYSICAL EXAM  Vitals:   01/11/19 1400  Height: 5' 9.5" (1.765 m)   Body mass index is 37.99 kg/m.  Generalized: Well developed, obese male in no acute distress  Head: normocephalic and atraumatic,. Oropharynx benign  Neck: Supple,  Musculoskeletal: No deformity  Skin no rash or edema Neurological examination   Mentation: Alert oriented to time, place, history taking. Attention span and concentration appropriate. Recent and remote memory intact.  Follows all commands speech and language fluent.   Cranial nerve II-XII: Pupils were equal round reactive to light extraocular movements were full, visual field were full on confrontational test. Facial sensation and strength were normal. hearing was intact to finger rubbing bilaterally. Uvula tongue midline. head turning and shoulder shrug were normal and symmetric.Tongue protrusion into cheek strength was normal. Motor: normal bulk and tone, full strength in the BUE, BLE, Sensory: normal and symmetric to light touch,     Coordination: finger-nose-finger, heel-to-shin bilaterally, no dysmetria Gait and Station: Rising up from seated position without assistance, normal stance,  moderate stride, good arm swing, smooth turning, able to perform tiptoe, and heel walking without difficulty. Tandem gait is steady  DIAGNOSTIC DATA (LABS, IMAGING, TESTING) - I reviewed patient records, labs, notes, testing and imaging myself where available.  Lab Results  Component Value Date   WBC 8.2 11/29/2017   HGB 17.2 (H) 11/29/2017   HCT 48.7 11/29/2017   MCV 88.1 11/29/2017   PLT 226 11/29/2017      Component Value Date/Time   NA 140 11/29/2017 1156   K 4.5 11/29/2017 1156   CL 105 11/29/2017 1156   CO2 26 11/29/2017 1156   GLUCOSE 83 11/29/2017 1156   BUN 19 11/29/2017 1156   CREATININE 0.94 11/29/2017 1156   CREATININE 1.07 08/25/2016 0950   CALCIUM 9.9 11/29/2017 1156   PROT 7.3 11/29/2017 1156   ALBUMIN 4.6 11/29/2017 1156   AST 52 (H) 11/29/2017 1156   ALT 136 (H) 11/29/2017 1156   ALKPHOS 51 11/29/2017 1156   BILITOT 0.9 11/29/2017 1156   GFRNONAA >60 11/29/2017 1156   GFRNONAA 87 08/25/2016 0950   GFRAA >60 11/29/2017 1156   GFRAA >89 08/25/2016 0950   Lab Results  Component Value Date   CHOL 213 (H) 08/25/2016   HDL 28 (L) 08/25/2016   LDLCALC 109 08/25/2016   TRIG 381 (H) 08/25/2016   CHOLHDL 7.6 (H) 08/25/2016    Lab Results  Component Value Date   TSH 1.437 02/22/2013      ASSESSMENT AND PLAN  Robert Mcpherson is a very pleasant 43 y.o.-year old male with an underlying medical history of hypertriglyceridemia, OCD, anxiety, depression, recent status post lumbar laminectomy, status post septoplasty and turbinate reduction, shoulder arthroscopic surgery, and history of obesity, who presents for new diagnosis of severe obstructive sleep apnea here for initial CPAP.    PLAN: CPAP compliance 100% reviewed data with patient Continue same settings Follow-up  in 6 months then yearly I spent 15  minutes in total face to face time with the patient more than 50% of  which was spent counseling and coordination of care, reviewing sleep test performed on 10/05/2018 and discussing those results with patient reviewing medications and discussing and reviewing the diagnosis of obstructive sleep apnea and insurance requirements for compliance Nilda Riggs, [facility], [facility], APRN  [facility] [address] [place], [place] [address] [phone]  I reviewed the above note and documentation by the Nurse Practitioner and agree with the history, physical exam, assessment and plan as outlined above. I was immediately available for face-to-face consultation. Huston Foley, MD, PhD [facility])

## 2019-01-11 ENCOUNTER — Encounter: Payer: Self-pay | Admitting: Nurse Practitioner

## 2019-01-11 ENCOUNTER — Other Ambulatory Visit: Payer: Self-pay

## 2019-01-11 ENCOUNTER — Ambulatory Visit: Payer: BLUE CROSS/BLUE SHIELD | Admitting: Nurse Practitioner

## 2019-01-11 DIAGNOSIS — Z9989 Dependence on other enabling machines and devices: Secondary | ICD-10-CM | POA: Diagnosis not present

## 2019-01-11 DIAGNOSIS — G4733 Obstructive sleep apnea (adult) (pediatric): Secondary | ICD-10-CM | POA: Insufficient documentation

## 2019-01-11 NOTE — Patient Instructions (Signed)
CPAP compliance 100% Continue same settings Follow-up in 6 months then yearly  

## 2019-01-17 ENCOUNTER — Ambulatory Visit: Payer: BLUE CROSS/BLUE SHIELD | Admitting: Physician Assistant

## 2019-01-17 DIAGNOSIS — G4733 Obstructive sleep apnea (adult) (pediatric): Secondary | ICD-10-CM | POA: Diagnosis not present

## 2019-01-26 ENCOUNTER — Telehealth: Payer: Self-pay | Admitting: Physician Assistant

## 2019-01-26 ENCOUNTER — Other Ambulatory Visit: Payer: Self-pay

## 2019-01-26 ENCOUNTER — Ambulatory Visit: Payer: BLUE CROSS/BLUE SHIELD | Admitting: Physician Assistant

## 2019-01-26 DIAGNOSIS — F411 Generalized anxiety disorder: Secondary | ICD-10-CM

## 2019-01-26 NOTE — Telephone Encounter (Signed)
Phone will not accept unknown phone calls. Wants to know if you can remove the unknow?

## 2019-01-26 NOTE — Progress Notes (Signed)
11:01 Got VM.  Left msg that I'll try again in a few mins. 11:11  Same as above.  11:17 LM for him to call office and reschedule.

## 2019-01-31 ENCOUNTER — Ambulatory Visit (INDEPENDENT_AMBULATORY_CARE_PROVIDER_SITE_OTHER): Payer: BLUE CROSS/BLUE SHIELD | Admitting: Family Medicine

## 2019-01-31 ENCOUNTER — Other Ambulatory Visit: Payer: Self-pay

## 2019-01-31 DIAGNOSIS — Z20828 Contact with and (suspected) exposure to other viral communicable diseases: Secondary | ICD-10-CM

## 2019-01-31 NOTE — Progress Notes (Signed)
Subjective:    Patient ID: Robert Mcpherson, male    DOB: 12/14/1975, 43 y.o.   MRN: [id number]  HPI Patient is a very pleasant 43 year old Caucasian male being seen today by telephone visit.  He consents to a telephone visit.  Patient is currently at home.  I am currently my office.  Phone call began at 904.  Phone call ended at 913.  Patient states that he was concerned that he may have been exposed to coronavirus.  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  Symptoms included high fever and chills and fatigue.  They went to the doctor yesterday.  Per the patient, their doctor diagnosed him with the flu.  I am uncertain if this was a clinical diagnosis or if it was based on a flu test however he recommended that they be quarantined for the next 7 days.  The patient currently is asymptomatic.  He denies any fever.  He denies any cough.  He denies any rhinorrhea.  He denies any body aches.  He denies any nausea vomiting or sore throat.  He does have some mild shortness of breath with activity but he states that that is chronic for him usually during this time a year due to allergies.  He denies any chest pain.  He is completely asymptomatic however he is concerned because his in-laws, his 4 children, and his wife live with him and he was worried that he may expose them.  Again the patient is completely asymptomatic Past Medical History:  Diagnosis Date  . Anxiety   . Dental crown present   . Depression   . Deviated nasal septum 09/2013  . High triglycerides   . 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 Nasal turbinate hypertrophy 09/2013  . OCD (obsessive compulsive disorder) 04/02/2014  . Sleep apnea    Past Surgical History:  Procedure Laterality Date  . APPENDECTOMY    . LUMBAR LAMINECTOMY/DECOMPRESSION MICRODISCECTOMY Left 12/02/2017   Procedure: Left Lumbar 4-5 disectomy;  Surgeon: Venita Lick, MD;  Location: [facility] OR;  Service: Orthopedics;  Laterality: Left;   2 hrs  . NASAL SEPTOPLASTY W/ TURBINOPLASTY Bilateral 10/02/2013   Procedure: BILATERAL NASAL SEPTOPLASTY WITH TURBINATE REDUCTION;  Surgeon: Serena Colonel, MD;  Location: [facility];  Service: ENT;  Laterality: Bilateral;  . SHOULDER ARTHROSCOPY Right 11/2013  . VASECTOMY  06/02/2013   Current Outpatient Medications on File Prior to Visit  Medication Sig Dispense Refill  . APPLE CIDER VINEGAR PO Take by mouth. Raw ASV 150    . Ascorbic Acid (VITAMIN C) 1000 MG tablet Take 1,000 mg by mouth 3 (three) times daily.    . Cholecalciferol (CVS VIT D 5000 HIGH-POTENCY PO) Take by mouth. One daily    . clonazePAM (KLONOPIN) 1 MG tablet Take 1 tablet (1 mg total) by mouth 3 (three) times daily as needed for anxiety. 270 tablet 1  . Milk Thistle 150 MG CAPS Take by mouth. Takes one daily    . sertraline (ZOLOFT) 100 MG tablet TAKE 1 TABLET BY MOUTH DAILY 30 tablet 0  . Turmeric 400 MG CAPS Take by mouth. Takes one daily     No current facility-administered medications on file prior to visit.    Allergies  Allergen Reactions  . Cephalexin Hives   Social History   Socioeconomic History  . Marital status: Married    Spouse name: Not on file  . Number of children:  Not on file  . Years of education: Not on file  . Highest education level: Not on file  Occupational History  . Not on file  Social Needs  . Financial resource strain: Not on file  . Food insecurity:    Worry: Not on file    Inability: Not on file  . Transportation needs:    Medical: Not on file    Non-medical: Not on file  Tobacco Use  . Smoking status: Never Smoker  . Smokeless tobacco: Never Used  Substance and Sexual Activity  . Alcohol use: No    Comment: history of alcohol use in his 71s, none now   . Drug use: No  . Sexual activity: Yes  Lifestyle  . Physical activity:    Days per week: Not on file    Minutes per session: Not on file  . Stress: Not on file  Relationships  . Social connections:     Talks on phone: Not on file    Gets together: Not on file    Attends religious service: Not on file    Active member of club or organization: Not on file    Attends meetings of clubs or organizations: Not on file    Relationship status: Not on file  . Intimate partner violence:    Fear of current or ex partner: Not on file    Emotionally abused: Not on file    Physically abused: Not on file    Forced sexual activity: Not on file  Other Topics Concern  . Not on file  Social History Narrative  . Not on file      Review of Systems  All other systems reviewed and are negative.      Objective:   Physical Exam  No physical exam was performed today as this was a telephone visit      Assessment & Plan:  Exposure to the flu  Based on his history, it sounds as though the patient has been exposed to the flu.  He is asymptomatic at present.  I explained to the patient that the flu and coronavirus carry similar symptoms which include fever body aches fatigue cough and shortness of breath.  I explained to the patient that he could be potentially carrying the virus and be contagious even a few days before his symptoms develop.  Therefore I recommended that he quarantine himself away from his family and others for at least the next week.  Whether this is a flu virus or corona, the patient would still be contagious.  If he has not developed any symptoms after at least 1 week and his employees have in fact had the flu, I believe he can return to work and life his usual.  If he develops any other symptoms, I want him to contact me immediately.  In accordance with the health department recommendations I did not recommend screening for coronavirus as this would not change management at this time.  I would still recommend a self quarantine regardless of whether his corona test is positive or negative.  Furthermore the patient does not require hospitalization today whether the corona test would be  positive or negative.

## 2019-02-13 ENCOUNTER — Other Ambulatory Visit: Payer: Self-pay | Admitting: Physician Assistant

## 2019-02-14 NOTE — Telephone Encounter (Signed)
-----   Message from Lambert Keto, LPN sent at [id number]/76/7341  2:53 PM EDT ----- Rosey Bath submitted a month refill for his sertraline, but she says he needs to reschedule.   Thanks  [employer]

## 2019-02-14 NOTE — Telephone Encounter (Signed)
Missed telehealth in March, nothing pending.

## 2019-02-14 NOTE — Telephone Encounter (Signed)
Pt called checking status on Sertraline refill pharm sent over yesterday. Pt out of meds

## 2019-02-14 NOTE — Telephone Encounter (Signed)
I'll send it, but he needs to make appt.

## 2019-02-24 DIAGNOSIS — G4733 Obstructive sleep apnea (adult) (pediatric): Secondary | ICD-10-CM | POA: Diagnosis not present

## 2019-03-15 ENCOUNTER — Other Ambulatory Visit: Payer: Self-pay | Admitting: Physician Assistant

## 2019-03-16 NOTE — Telephone Encounter (Signed)
Last visit 09/2018 Unable to contact him for appt 03/26, no appts scheduled

## 2019-04-11 DIAGNOSIS — G4733 Obstructive sleep apnea (adult) (pediatric): Secondary | ICD-10-CM | POA: Diagnosis not present

## 2019-04-13 ENCOUNTER — Other Ambulatory Visit: Payer: Self-pay | Admitting: Physician Assistant

## 2019-04-13 NOTE — Telephone Encounter (Signed)
Messages have been left for him to make appt but not called back

## 2019-04-14 ENCOUNTER — Encounter: Payer: Self-pay | Admitting: Physician Assistant

## 2019-06-11 ENCOUNTER — Other Ambulatory Visit: Payer: Self-pay | Admitting: Physician Assistant

## 2019-06-15 ENCOUNTER — Telehealth: Payer: Self-pay | Admitting: Family Medicine

## 2019-06-15 MED ORDER — SERTRALINE HCL 100 MG PO TABS: 100.0000 mg | ORAL_TABLET | Freq: Every day | ORAL | 2 refills | Status: DC

## 2019-06-15 NOTE — Telephone Encounter (Signed)
Pt was dismissed from his psychiatrists office for refusal to wear a mask d/t his nasal blockages and it is extremely hard for him to breath.   He would like to know if we could refill those medications for him. Per Dr. Dennard Schaumann ok to refill. He only takes his Klonopin bid - tid prn and has plenty of that he just needs his zoloft refilled as he is out of it today.   Zoloft sent to pharm

## 2019-06-29 ENCOUNTER — Other Ambulatory Visit: Payer: Self-pay | Admitting: Family Medicine

## 2019-06-29 MED ORDER — CLONAZEPAM 1 MG PO TABS: ORAL_TABLET | ORAL | 0 refills | Status: DC

## 2019-06-29 NOTE — Telephone Encounter (Signed)
Patients wife stopped by the office states that her husband needs a refill on his clonazepam 1 mg called into [facility] on [address]. She states he takes this three times a day.  CB# [phone]

## 2019-06-29 NOTE — Telephone Encounter (Signed)
Requesting refill  Klonopin  LOV:  01/31/19  LRF:  03/16/19

## 2019-07-19 ENCOUNTER — Ambulatory Visit: Payer: Self-pay | Admitting: Family Medicine

## 2019-07-26 ENCOUNTER — Other Ambulatory Visit: Payer: Self-pay | Admitting: Family Medicine

## 2019-07-27 NOTE — Telephone Encounter (Signed)
Last office visit: 06/10/18 Last refilled: 06/29/2019

## 2019-09-18 ENCOUNTER — Ambulatory Visit: Payer: Self-pay | Admitting: Family Medicine

## 2019-09-25 ENCOUNTER — Other Ambulatory Visit: Payer: Self-pay

## 2019-10-04 ENCOUNTER — Other Ambulatory Visit: Payer: Self-pay | Admitting: Family Medicine

## 2019-10-04 NOTE — Telephone Encounter (Signed)
Patients wife called in requesting an update on refill request that [facility] sent over yesterday. Patient needs refill on his klonopin he uses [facility] on [address].  CB# [phone]

## 2019-10-04 NOTE — Telephone Encounter (Signed)
Requesting refill    Klonopin  LOV: 01/31/19  LRF:  07/27/19

## 2019-10-05 MED ORDER — CLONAZEPAM 1 MG PO TABS: 1.0000 mg | ORAL_TABLET | Freq: Three times a day (TID) | ORAL | 1 refills | Status: DC | PRN

## 2019-11-21 ENCOUNTER — Ambulatory Visit: Payer: Self-pay | Admitting: Family Medicine

## 2019-12-05 ENCOUNTER — Other Ambulatory Visit: Payer: Self-pay | Admitting: Family Medicine

## 2019-12-05 NOTE — Telephone Encounter (Signed)
Ok to refill??  Last office visit 01/31/2019.  Last refill 10/05/2019.

## 2020-01-01 ENCOUNTER — Other Ambulatory Visit: Payer: Self-pay | Admitting: Family Medicine

## 2020-01-01 NOTE — Telephone Encounter (Signed)
Ok to refill??  Last office visit 3/31/220.  Last refill 12/05/2019.

## 2020-02-06 ENCOUNTER — Other Ambulatory Visit: Payer: Self-pay | Admitting: Family Medicine

## 2020-02-06 NOTE — Telephone Encounter (Signed)
Ok to refill??  Last office visit 01/31/2019.  Last refill 01/02/2020.  Of note, letter sent to patient to schedule OV.

## 2020-02-29 ENCOUNTER — Ambulatory Visit: Payer: Self-pay | Admitting: Family Medicine

## 2020-03-04 ENCOUNTER — Ambulatory Visit: Payer: Self-pay | Admitting: Family Medicine

## 2020-03-04 ENCOUNTER — Other Ambulatory Visit: Payer: Self-pay | Admitting: Family Medicine

## 2020-03-05 NOTE — Telephone Encounter (Signed)
Requesting refill  Klonopin  LOV:  01/31/2019  LRF:  02/06/2020

## 2020-03-06 ENCOUNTER — Other Ambulatory Visit: Payer: Self-pay | Admitting: Physician Assistant

## 2020-03-06 ENCOUNTER — Telehealth: Payer: Self-pay | Admitting: Family Medicine

## 2020-03-06 NOTE — Telephone Encounter (Signed)
[name] called and states that the patient can not wear a mask. He would come in if ok not to wear mask. He really needs a refill on his Klonopin and Zoloft.  ? Ok to do video visit?

## 2020-03-07 ENCOUNTER — Other Ambulatory Visit: Payer: Self-pay | Admitting: Family Medicine

## 2020-03-07 ENCOUNTER — Telehealth: Payer: Self-pay | Admitting: Family Medicine

## 2020-03-07 MED ORDER — CLONAZEPAM 1 MG PO TABS: ORAL_TABLET | ORAL | 0 refills | Status: DC

## 2020-03-07 NOTE — Telephone Encounter (Signed)
Patient has virtual appointment tomorrow with dr Tanya Nones, however is out of his colnaxepam  And needs to today if possible  [facility] [address]

## 2020-03-07 NOTE — Telephone Encounter (Signed)
ok 

## 2020-03-07 NOTE — Telephone Encounter (Signed)
[name] aware of need for video visit via vm

## 2020-03-07 NOTE — Telephone Encounter (Signed)
I sent it in 

## 2020-03-08 ENCOUNTER — Other Ambulatory Visit: Payer: Self-pay

## 2020-03-08 ENCOUNTER — Telehealth (INDEPENDENT_AMBULATORY_CARE_PROVIDER_SITE_OTHER): Payer: Self-pay | Admitting: Family Medicine

## 2020-03-08 DIAGNOSIS — F419 Anxiety disorder, unspecified: Secondary | ICD-10-CM

## 2020-03-08 NOTE — Progress Notes (Signed)
Subjective:    Patient ID: Robert Mcpherson, male    DOB: 1976-10-05, 44 y.o.   MRN: [id number]  HPI   Patient is being seen today as a video conference call.  Phone call began at 12:00.  Phone call concluded at 1210.  Patient consents to be seen via video conference call.  Patient has history of anxiety.  He is still taking Zoloft every day.  He takes Klonopin at night.  He takes 1-1/2 mg of Klonopin at night/3 0.5 mg tablets.  He has been on it for years.  He denies any side effects.  With medication he is able to relax and go to sleep.  Without it he tosses and turns.  He has become habituated over time and is dependent on medication to help him sleep.  He denies any chest pain shortness of breath or dyspnea on exertion.  He has not had his blood pressure checked or his lab work checked at this office in quite some time.  He did see Dr. Casimiro Needle recently and had his testosterone level checked.  He was started on testosterone implants every 6 months.  He does feel better since starting testosterone implants.  Apparently he had intermuscular rods placed in his gluteus.  He denies any side effects from this.  His energy level has improved.  He is also on over-the-counter bioidentical "estrogen".  However no one has checked his PSA, CBC, or fasting lipid panel since he had his labs drawn.  He has not had his Covid vaccination. Past Medical History:  Diagnosis Date  . Anxiety   . Dental crown present   . Depression   . Deviated nasal septum 09/2013  . High triglycerides   . 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 Nasal turbinate hypertrophy 09/2013  . OCD (obsessive compulsive disorder) 04/02/2014  . Sleep apnea    Past Surgical History:  Procedure Laterality Date  . APPENDECTOMY    . LUMBAR LAMINECTOMY/DECOMPRESSION MICRODISCECTOMY Left 12/02/2017   Procedure: Left Lumbar 4-5 disectomy;  Surgeon: Melina Schools, MD;  Location: [facility];  Service: Orthopedics;  Laterality: Left;  2 hrs  . NASAL  SEPTOPLASTY W/ TURBINOPLASTY Bilateral 10/02/2013   Procedure: BILATERAL NASAL SEPTOPLASTY WITH TURBINATE REDUCTION;  Surgeon: Izora Gala, MD;  Location: [facility];  Service: ENT;  Laterality: Bilateral;  . SHOULDER ARTHROSCOPY Right 11/2013  . VASECTOMY  06/02/2013   Current Outpatient Medications on File Prior to Visit  Medication Sig Dispense Refill  . APPLE CIDER VINEGAR PO Take by mouth. Raw ASV 150    . Ascorbic Acid (VITAMIN C) 1000 MG tablet Take 1,000 mg by mouth 3 (three) times daily.    . Cholecalciferol (CVS VIT D 5000 HIGH-POTENCY PO) Take by mouth. One daily    . clonazePAM (KLONOPIN) 1 MG tablet TAKE 1 TABLET(1 MG) BY MOUTH THREE TIMES DAILY AS NEEDED FOR ANXIETY 90 tablet 0  . Milk Thistle 150 MG CAPS Take by mouth. Takes one daily    . sertraline (ZOLOFT) 100 MG tablet Take 1 tablet (100 mg total) by mouth daily. 90 tablet 2  . Turmeric 400 MG CAPS Take by mouth. Takes one daily     No current facility-administered medications on file prior to visit.   Allergies  Allergen Reactions  . Cephalexin Hives   Social History   Socioeconomic History  . Marital status: Married    Spouse name: Not on file  . Number of children: Not on file  .  Years of education: Not on file  . Highest education level: Not on file  Occupational History  . Not on file  Tobacco Use  . Smoking status: Never Smoker  . Smokeless tobacco: Never Used  Substance and Sexual Activity  . Alcohol use: No    Comment: history of alcohol use in his 23s, none now   . Drug use: No  . Sexual activity: Yes  Other Topics Concern  . Not on file  Social History Narrative  . Not on file   Social Determinants of Health   Financial Resource Strain:   . Difficulty of Paying Living Expenses:   Food Insecurity:   . Worried About Programme researcher, broadcasting/film/video in the Last Year:   . Barista in the Last Year:   Transportation Needs:   . Freight forwarder (Medical):   Marland Kitchen Lack of  Transportation (Non-Medical):   Physical Activity:   . Days of Exercise per Week:   . Minutes of Exercise per Session:   Stress:   . Feeling of Stress :   Social Connections:   . Frequency of Communication with Friends and Family:   . Frequency of Social Gatherings with Friends and Family:   . Attends Religious Services:   . Active Member of Clubs or Organizations:   . Attends Banker Meetings:   Marland Kitchen Marital Status:   Intimate Partner Violence:   . Fear of Current or Ex-Partner:   . Emotionally Abused:   Marland Kitchen Physically Abused:   . Sexually Abused:     Review of Systems  All other systems reviewed and are negative.      Objective:   Physical Exam        Assessment & Plan:  Anxiety  Overall, patient seems to be doing well.  His Klonopin seems to be working fine to help with his insomnia.  His anxiety seems to be well managed on Zoloft.  I will make no changes in his medication at this time.  However I did recommend that the patient have his labs checked every 6 months while on testosterone placement.  Specifically I would like to check a PSA, testosterone level, CBC, and fasting lipid panel.  Also recommended COVID-19 vaccination and explained the rationale for that.

## 2020-03-14 ENCOUNTER — Other Ambulatory Visit: Payer: Self-pay | Admitting: Family Medicine

## 2020-04-08 ENCOUNTER — Other Ambulatory Visit: Payer: Self-pay | Admitting: Family Medicine

## 2020-04-08 NOTE — Telephone Encounter (Signed)
Requested Prescriptions   Pending Prescriptions Disp Refills   clonazePAM (KLONOPIN) 1 MG tablet [Pharmacy Med Name: CLONAZEPAM 1MG  TABLETS] 90 tablet 0    Sig: TAKE 1 TABLET(1 MG) BY MOUTH THREE TIMES DAILY AS NEEDED FOR ANXIETY     Last OV 03/08/2020  Last written 03/07/2020

## 2020-04-24 ENCOUNTER — Telehealth: Payer: Self-pay | Admitting: Neurology

## 2020-04-24 NOTE — Telephone Encounter (Signed)
[name] called wanting to discuss pt's mask for the cpap machine. Please advise.

## 2020-04-24 NOTE — Telephone Encounter (Signed)
I called the pt's wife (Robert Mcpherson) ok per dpr and we discussed the message.  [name] called to request a virtual appt to discuss pt's cpap supply renewal.  His last appt was 01/11/2019. Appt made on 04/30/2020 at 1115.

## 2020-04-29 ENCOUNTER — Telehealth: Payer: Self-pay

## 2020-04-29 NOTE — Telephone Encounter (Signed)
LVM No cpap data

## 2020-04-30 ENCOUNTER — Encounter: Payer: Self-pay | Admitting: Adult Health

## 2020-04-30 ENCOUNTER — Telehealth (INDEPENDENT_AMBULATORY_CARE_PROVIDER_SITE_OTHER): Payer: 59 | Admitting: Adult Health

## 2020-04-30 DIAGNOSIS — Z9989 Dependence on other enabling machines and devices: Secondary | ICD-10-CM

## 2020-04-30 DIAGNOSIS — G4733 Obstructive sleep apnea (adult) (pediatric): Secondary | ICD-10-CM

## 2020-04-30 NOTE — Progress Notes (Addendum)
[facility]  PATIENT: Robert Mcpherson DOB: 08/11/1976   [facility] provider: Dr. Frances FurbishAthar REASON FOR VISIT: OSA on CPAP HISTORY FROM: Patient  Virtual Visit via Video Note  I connected with Robert BrilliantHobert G Mcpherson on 04/30/20 at 11:15 AM EDT by a video enabled telemedicine application [facility] and verified that I am speaking with the correct person using two identifiers who was located in the car.   I discussed the limitations of evaluation and management by telemedicine and the availability of in person appointments. The patient expressed understanding and agreed to proceed.    HISTORY OF PRESENT ILLNESS:  Today, 04/30/2020, Mr. Robert Mcpherson is being seen via virtual visit for CPAP follow-up.  Compliance report from 12/15/2019 -01/13/2020 shows 19 out of 30 usage days with 15 days greater than 4 hours for a 50% compliance.  Average usage 4 hours and 52 minutes.  Residual AHI 0.8 with min pressure 7 and max pressure 15 with a EPR level 1.  Pressure in the 95th percentile 11.5.  Leaks in the 95th percentile 0.9.  He reports having difficulty using prior mask due to nasal discomfort hence the reason for noncompliance He has noticed increased daytime fatigue and difficulty sleeping at night when not using CPAP     History provided for reference purposes only UPDATE 3/11/2020CM Mr. Robert Mcpherson, 44 year old male returns for follow-up with newly diagnosed severe obstructive sleep apnea here for his initial CPAP.  Patient claims he feels so much better, he is well rested every morning and he is sleeping so much better.  CPAP data dated 12/10/2018-01/08/2019 shows compliance greater than 4 hours at 100%.  Average usage 7 hours 4 minutes.  Set pressure 7 to 15 cm.  EPR level 1 leak 95th percentile 1.4.  AHI 0.7 ESS 4.  He returns for reevaluation.  Initial consult visit 09/06/18 Dr. Frances FurbishAthar: Robert Mcpherson is a 44 year old right-handed gentleman with an underlying medical history of  hypertriglyceridemia, OCD, anxiety, depression, recent status post lumbar laminectomy, status post septoplasty and turbinate reduction, shoulder arthroscopic surgery, and history of obesity, who reports snoring and excessive daytime somnolence. I reviewed your office note from 06/10/2018. He had a sleep study 5 years ago which showed mild sleep apnea. I reviewed his PSG report from 09/11/2013, study was interpreted by Dr. Maple Mcpherson. Of note, his weight was recorded to be 200 tablet at the time, BMI of 31. Overall AHI was 11 per hour. He achieved all stages of sleep with 15.9% of REM sleep, sleep latency was 45 minutes, REM latency 78 minutes. Average oxygen saturation was 93.2%, nadir was 83%. No significant PLMs. His Epworth sleepiness score is 10 out of 24 today, fatigue score is 56 out of 56 (one question was not answered, so total score is out of 56, not 63). Is married and lives with his wife and children. He has a total of 4 children and is self-employed, Radio broadcast assistantlandscaping business. He is a nonsmoker and drinks alcohol occasionally, maybe twice a month or so and drinks caffeine daily in the form of iced coffee about 2 per day, large servings, which helps him stay alert during the day. He reports significant weight gain secondary to back pain. He has a family history of sleep apnea and has several uncles and cousins on CPAP machines. He would be willing to try CPAP. He did not have much in the way of improvement of his nasal breathing after nasal surgery. He does not keep a very set schedule for sleep.  He reports sleeping some during the day and also somewhat night. Typical bedtime is after midnight, typically before 2 AM. Rise time is around 8. He takes a 3-4 hour nap every day around 1 or 2 PM. He has 4 younger children, ages 70, 74, 53, and for. They are all homeschooled. He denies night to night nocturia but has had morning headaches. He has had mood irritability especially when he is sleep deprived or not sleeping  well.  REVIEW OF SYSTEMS: Full 14 system review of systems performed and notable only for those listed, all others are neg:  Constitutional: neg  Cardiovascular: neg Ear/Nose/Throat: neg  Skin: neg Eyes: neg Respiratory: neg Gastroitestinal: neg  Hematology/Lymphatic: neg  Endocrine: neg Musculoskeletal:neg Allergy/Immunology: neg Neurological: neg Psychiatric: neg Sleep : Obstructive sleep apnea with CPAP   ALLERGIES: Allergies  Allergen Reactions   Cephalexin Hives    HOME MEDICATIONS: Outpatient Medications Prior to Visit  Medication Sig Dispense Refill   APPLE CIDER VINEGAR PO Take by mouth. Raw ASV 150     Ascorbic Acid (VITAMIN C) 1000 MG tablet Take 1,000 mg by mouth 3 (three) times daily.     Cholecalciferol (CVS VIT D 5000 HIGH-POTENCY PO) Take by mouth. One daily     clonazePAM (KLONOPIN) 1 MG tablet TAKE 1 TABLET(1 MG) BY MOUTH THREE TIMES DAILY AS NEEDED FOR ANXIETY 90 tablet 0   Milk Thistle 150 MG CAPS Take by mouth. Takes one daily     sertraline (ZOLOFT) 100 MG tablet Take 1 tablet by mouth daily. 90 tablet 2   Turmeric 400 MG CAPS Take by mouth. Takes one daily     No facility-administered medications prior to visit.    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   Nasal turbinate hypertrophy 09/2013   OCD (obsessive compulsive disorder) 04/02/2014   Sleep apnea     PAST SURGICAL HISTORY: Past Surgical History:  Procedure Laterality Date   APPENDECTOMY     LUMBAR LAMINECTOMY/DECOMPRESSION MICRODISCECTOMY Left 12/02/2017   Procedure: Left Lumbar 4-5 disectomy;  Surgeon: Venita Lick, MD;  Location: [facility];  Service: Orthopedics;  Laterality: Left;  2 hrs   NASAL SEPTOPLASTY W/ TURBINOPLASTY Bilateral 10/02/2013   Procedure: BILATERAL NASAL SEPTOPLASTY WITH TURBINATE  REDUCTION;  Surgeon: Serena Colonel, MD;  Location: [facility];  Service: ENT;  Laterality: Bilateral;   SHOULDER ARTHROSCOPY Right 11/2013   VASECTOMY  06/02/2013    FAMILY HISTORY: Family History  Problem Relation Age of Onset   Heart disease Maternal Uncle 52       heart attack   Heart disease Maternal Uncle 13       CVA   Colon cancer Neg Hx    Colon polyps Neg Hx    Liver disease Neg Hx     SOCIAL HISTORY: Social History   Socioeconomic History   Marital status: Married    Spouse name: Not on file   Number of children: Not on file   Years of education: Not on file   Highest education level: Not on file  Occupational History   Not on file  Tobacco Use   Smoking status: Never Smoker   Smokeless tobacco: Never Used  Vaping Use   Vaping Use: Never used  Substance and Sexual Activity  Alcohol use: No    Comment: history of alcohol use in his 19s, none now    Drug use: No   Sexual activity: Yes  Other Topics Concern   Not on file  Social History Narrative   Not on file   Social Determinants of Health   Financial Resource Strain:    Difficulty of Paying Living Expenses:   Food Insecurity:    Worried About Programme researcher, broadcasting/film/video in the Last Year:    Barista in the Last Year:   Transportation Needs:    Freight forwarder (Medical):    Lack of Transportation (Non-Medical):   Physical Activity:    Days of Exercise per Week:    Minutes of Exercise per Session:   Stress:    Feeling of Stress :   Social Connections:    Frequency of Communication with Friends and Family:    Frequency of Social Gatherings with Friends and Family:    Attends Religious Services:    Active Member of Clubs or Organizations:    Attends Engineer, structural:    Marital Status:   Intimate Partner Violence:    Fear of Current or Ex-Partner:    Emotionally Abused:    Physically Abused:    Sexually Abused:       PHYSICAL EXAM  Limited exam due to visit type  General: well developed, well nourished, pleasant middle-aged Caucasian male, seated, in no evident distress Head: head normocephalic and atraumatic.    Neurologic Exam Mental Status: Awake and fully alert. Oriented to place and time. Recent and remote memory intact. Attention span, concentration and fund of knowledge appropriate. Mood and affect appropriate.  Cranial Nerves: Extraocular movements full without nystagmus. Hearing intact to voice. Facial sensation intact. Face, tongue, palate moves normally and symmetrically.  Shoulder shrug symmetric. Motor: No evidence of weakness per drift assessment Coordination: Rapid alternating movements normal in all extremities. Finger-to-nose and heel-to-shin performed accurately bilaterally.     DIAGNOSTIC DATA (LABS, IMAGING, TESTING) - I reviewed patient records, labs, notes, testing and imaging myself where available.  Lab Results  Component Value Date   WBC 8.2 11/29/2017   HGB 17.2 (H) 11/29/2017   HCT 48.7 11/29/2017   MCV 88.1 11/29/2017   PLT 226 11/29/2017      Component Value Date/Time   NA 140 11/29/2017 1156   K 4.5 11/29/2017 1156   CL 105 11/29/2017 1156   CO2 26 11/29/2017 1156   GLUCOSE 83 11/29/2017 1156   BUN 19 11/29/2017 1156   CREATININE 0.94 11/29/2017 1156   CREATININE 1.07 08/25/2016 0950   CALCIUM 9.9 11/29/2017 1156   PROT 7.3 11/29/2017 1156   ALBUMIN 4.6 11/29/2017 1156   AST 52 (H) 11/29/2017 1156   ALT 136 (H) 11/29/2017 1156   ALKPHOS 51 11/29/2017 1156   BILITOT 0.9 11/29/2017 1156   GFRNONAA >60 11/29/2017 1156   GFRNONAA 87 08/25/2016 0950   GFRAA >60 11/29/2017 1156   GFRAA >89 08/25/2016 0950   Lab Results  Component Value Date   CHOL 213 (H) 08/25/2016   HDL 28 (L) 08/25/2016   LDLCALC 109 08/25/2016   TRIG 381 (H) 08/25/2016   CHOLHDL 7.6 (H) 08/25/2016    Lab Results  Component Value Date   TSH 1.437 02/22/2013       ASSESSMENT AND PLAN  Seif FORREST JAROSZEWSKI is a very pleasant 44 y.o.-year old male with an underlying medical history of hypertriglyceridemia, OCD, anxiety, depression, recent status  post lumbar laminectomy, status post septoplasty and turbinate reduction, shoulder arthroscopic surgery, and history of obesity, who presents for severe obstructive sleep apnea and CPAP follow-up.  Recent CPAP noncompliance due to difficulty tolerating current mask Order will be placed to DME company to refit patient with a different mask as well as obtain ongoing supplies Requested patient call office 1 month after obtaining new mask for reevaluation of compliance and treatment of sleep apnea Discussed importance of compliance with CPAP He is aware to continue to follow with DME company [employer] for ongoing supplies or CPAP related concerns   Follow-up in 1 year or call earlier if needed   CC: Huston Foley, MD Donita Brooks, MD    I spent 20 minutes of face-to-face via video visit and non-face-to-face time with patient.  This included previsit chart review, lab review, study review, order entry, electronic health record documentation, patient education   Ihor Austin, [facility]  [facility] [address] [place], [place] [address]  Phone [phone] Fax [phone] Note: This document was prepared with digital dictation and possible smart phrase technology. Any transcriptional errors that result from this process are unintentional.  I reviewed the above note and documentation by the Nurse Practitioner and agree with the history, exam, assessment and plan as outlined above. I was available for consultation. Huston Foley, MD, PhD [facility])

## 2020-05-02 NOTE — Progress Notes (Signed)
Order for cpap mask fitting and supplies sent to [facility] via community msg. Confirmation received that the order transmitted was successful.

## 2020-05-07 ENCOUNTER — Other Ambulatory Visit: Payer: Self-pay | Admitting: Family Medicine

## 2020-05-08 NOTE — Telephone Encounter (Signed)
Ok to refill??  Last office visit 03/08/2020.  Last refill 04/08/2020.  Ok to add refills to prescription?

## 2020-07-09 ENCOUNTER — Telehealth: Payer: Self-pay | Admitting: Family Medicine

## 2020-07-09 NOTE — Telephone Encounter (Signed)
CB# [phone] Pt call to change pharmacy only when ordering  his Klonopin to [id number] - [place],  - [address]

## 2020-07-10 ENCOUNTER — Other Ambulatory Visit: Payer: Self-pay

## 2020-07-10 NOTE — Telephone Encounter (Signed)
Rx refill routed to his Provider

## 2020-07-11 MED ORDER — CLONAZEPAM 1 MG PO TABS: ORAL_TABLET | ORAL | 3 refills | Status: DC

## 2020-09-20 ENCOUNTER — Encounter (HOSPITAL_COMMUNITY): Payer: Self-pay

## 2020-09-20 ENCOUNTER — Emergency Department (HOSPITAL_COMMUNITY)
Admission: EM | Admit: 2020-09-20 | Discharge: 2020-09-20 | Disposition: A | Discharge status: Home / Self Care | Payer: 59 | Attending: Emergency Medicine | Admitting: Emergency Medicine

## 2020-09-20 ENCOUNTER — Other Ambulatory Visit: Payer: Self-pay

## 2020-09-20 DIAGNOSIS — F19920 Other psychoactive substance use, unspecified with intoxication, uncomplicated: Secondary | ICD-10-CM | POA: Insufficient documentation

## 2020-09-20 DIAGNOSIS — Z79899 Other long term (current) drug therapy: Secondary | ICD-10-CM | POA: Insufficient documentation

## 2020-09-20 MED ORDER — ACETAMINOPHEN 500 MG PO TABS
1000.0000 mg | ORAL_TABLET | Freq: Once | ORAL | Status: AC
  Administered 2020-09-20: 1000 mg via ORAL
  Filled 2020-09-20: qty 2

## 2020-09-20 MED ORDER — LORAZEPAM 2 MG/ML IJ SOLN
2.0000 mg | Freq: Once | INTRAMUSCULAR | Status: AC
  Administered 2020-09-20: 2 mg via INTRAVENOUS
  Filled 2020-09-20: qty 1

## 2020-09-20 NOTE — ED Notes (Signed)
Report given to oncoming nurse.

## 2020-09-20 NOTE — ED Triage Notes (Signed)
Pt brought in by EMS for hallucinations after ingesting two THC gummies. Pt is oriented x 4. Pt singing and talkative in triage.

## 2020-09-20 NOTE — ED Notes (Signed)
Patient 88% spo2 on room air.  Documented pmh of sleep apnea.  Patient placed on 02 @ 2L nasal cannula.  Sp02 improved to 94%.

## 2020-09-20 NOTE — ED Provider Notes (Addendum)
[facility] EMERGENCY DEPARTMENT Provider Note   CSN: [id number] Arrival date & time: 09/20/20  0153     History Chief Complaint  Patient presents with  . Hallucinations    ingested THC gummies    Robert Mcpherson is a 44 y.o. male.  No h/o MJ use but apparently ingested 2 'delta 8' gummies earlier this evening and has been altered since then. Patient history non ctonributory secondary to intoxication.     LEVEL V CAVEAT SECONDARY TO AMS/INTOXICATION    Past Medical History:  Diagnosis Date  . Anxiety   . Dental crown present   . Depression   . Deviated nasal septum 09/2013  . High triglycerides   . 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 Nasal turbinate hypertrophy 09/2013  . OCD (obsessive compulsive disorder) 04/02/2014  . Sleep apnea     Patient Active Problem List   Diagnosis Date Noted  . Obstructive sleep apnea treated with continuous positive airway pressure (CPAP) 01/11/2019  . Sleep apnea   . Abnormal transaminases 06/03/2018  . S/P lumbar microdiscectomy 12/02/2017  . OCD (obsessive compulsive disorder) 04/02/2014  . Anxiety   . Generalized anxiety disorder 02/06/2013    Past Surgical History:  Procedure Laterality Date  . APPENDECTOMY    . LUMBAR LAMINECTOMY/DECOMPRESSION MICRODISCECTOMY Left 12/02/2017   Procedure: Left Lumbar 4-5 disectomy;  Surgeon: Venita Lick, MD;  Location: [facility] OR;  Service: Orthopedics;  Laterality: Left;  2 hrs  . NASAL SEPTOPLASTY W/ TURBINOPLASTY Bilateral 10/02/2013   Procedure: BILATERAL NASAL SEPTOPLASTY WITH TURBINATE REDUCTION;  Surgeon: Serena Colonel, MD;  Location: [facility];  Service: ENT;  Laterality: Bilateral;  . SHOULDER ARTHROSCOPY Right 11/2013  . VASECTOMY  06/02/2013       Family History  Problem Relation Age of Onset  . Heart disease Maternal Uncle 52       heart attack  . Heart disease Maternal Uncle 61       CVA  . Colon cancer Neg Hx   . Colon polyps Neg Hx   . Liver disease Neg Hx      Social History   Tobacco Use  . Smoking status: Never Smoker  . Smokeless tobacco: Never Used  Vaping Use  . Vaping Use: Never used  Substance Use Topics  . Alcohol use: No    Comment: history of alcohol use in his 18s, none now   . Drug use: Yes    Types: Marijuana    Home Medications Prior to Admission medications   Medication Sig Start Date End Date Taking? Authorizing Provider  APPLE CIDER VINEGAR PO Take by mouth. Raw ASV 150    [provider]  Ascorbic Acid (VITAMIN C) 1000 MG tablet Take 1,000 mg by mouth 3 (three) times daily.    [provider]  Cholecalciferol (CVS VIT D 5000 HIGH-POTENCY PO) Take by mouth. One daily    [provider]  clonazePAM (KLONOPIN) 1 MG tablet TAKE 1 TABLET (1MG ) BY MOUTH THREE TIMES DAILY AS NEEDED FOR ANXIETY 07/11/20   09/10/20, MD  Milk Thistle 150 MG CAPS Take by mouth. Takes one daily    [provider]  sertraline (ZOLOFT) 100 MG tablet Take 1 tablet by mouth daily. 03/14/20   03/16/20, MD  Turmeric 400 MG CAPS Take by mouth. Takes one daily    [provider]    Allergies    Cephalexin  Review of Systems   Review of  Systems  Unable to perform ROS: Mental status change    Physical Exam Updated Vital Signs BP (!) 147/99   Pulse 98   Temp 97.7 F (36.5 C) (Oral)   Resp 20   Ht 5\' 9"  (1.753 m)   Wt 121.1 kg   SpO2 96%   BMI 39.43 kg/m   Physical Exam Vitals and nursing note reviewed.  Constitutional:      Appearance: He is well-developed.  HENT:     Head: Normocephalic and atraumatic.     Mouth/Throat:     Mouth: Mucous membranes are moist.     Pharynx: Oropharynx is clear.  Eyes:     Pupils: Pupils are equal, round, and reactive to light.  Cardiovascular:     Rate and Rhythm: Normal rate.  Pulmonary:     Effort: Pulmonary effort is normal. No respiratory distress.  Abdominal:     General: There is no distension.  Musculoskeletal:        General:  Normal range of motion.     Cervical back: Normal range of motion.  Skin:    General: Skin is warm and dry.  Neurological:     General: No focal deficit present.     Mental Status: He is alert.  Psychiatric:        Mood and Affect: Mood is anxious and elated.        Behavior: Behavior is hyperactive.     ED Results / Procedures / Treatments   Labs (all labs ordered are listed, but only abnormal results are displayed) Labs Reviewed - No data to display  EKG None  Radiology No results found.  Procedures Procedures (including critical care time)  Medications Ordered in ED Medications  LORazepam (ATIVAN) injection 2 mg (2 mg Intravenous Given 09/20/20 0215)  acetaminophen (TYLENOL) tablet 1,000 mg (1,000 mg Oral Given 09/20/20 09/22/20)    ED Course  I have reviewed the triage vital signs and the nursing notes.  Pertinent labs & imaging results that were available during my care of the patient were reviewed by me and considered in my medical decision making (see chart for details).    MDM Rules/Calculators/A&P                          Acutely intoxcated. Will give ativan, observe. DC when clinically sober.   Observed for >6 hours with significant improvement. Wife here, corroborates story. Patient with mild headache, but overall is ready for discharge.   Final Clinical Impression(s) / ED Diagnoses Final diagnoses:  Drug intoxication without complication [facility])    Rx / DC Orders ED Discharge Orders    None       Lenvil Swaim, [facility], MD 09/21/20 0133    09/23/20, MD 09/21/20 0134

## 2020-09-20 NOTE — ED Notes (Signed)
Pt ambulated to bathroom without issues.

## 2020-10-03 ENCOUNTER — Telehealth: Payer: Self-pay | Admitting: Family Medicine

## 2020-10-03 ENCOUNTER — Other Ambulatory Visit: Payer: Self-pay

## 2020-10-03 MED ORDER — SERTRALINE HCL 100 MG PO TABS: 100.0000 mg | ORAL_TABLET | Freq: Every day | ORAL | 2 refills | Status: DC

## 2020-10-03 NOTE — Telephone Encounter (Signed)
Rx refill sent to Provider 

## 2020-10-03 NOTE — Telephone Encounter (Signed)
Need refill Zoloft also please delete all pharmacy beside [facility]

## 2020-10-08 ENCOUNTER — Telehealth: Payer: 59 | Admitting: Family Medicine

## 2020-10-08 DIAGNOSIS — R21 Rash and other nonspecific skin eruption: Secondary | ICD-10-CM

## 2020-10-08 MED ORDER — TRIAMCINOLONE ACETONIDE 0.1 % EX CREA
1.0000 | TOPICAL_CREAM | Freq: Two times a day (BID) | CUTANEOUS | 1 refills | Status: DC
Start: 2020-10-08 — End: 2020-10-21

## 2020-10-08 NOTE — Progress Notes (Signed)
Subjective:    Patient ID: Robert Mcpherson, male    DOB: [birth year], 44 y.o.   MRN: [id number]  HPI Patient is a very pleasant 44 year old Caucasian male being seen today as a video visit.  Visit began at 4:00.  Visit concluded at 410.  Patient consents to be seen via video.  He is currently at home.  I am currently my office.  He reports a rash on both legs and his lower abdomen for 2 or 3 months.  The rash consists of erythematous papular lesions that are approximately 6 to 8 mm in diameter.  They are raised bumps.  They are scattered over the lower shin bilaterally.  There is also similar lesions on the upper thigh as well as on the lower abdomen.  There is no rash on his chest or on his back or in his axilla or in his groin.  They do itch.  They tend to be worse with heat and sweat.  They are better with cool weather. Past Medical History:  Diagnosis Date  . Anxiety   . Dental crown present   . Depression   . Deviated nasal septum 09/2013  . High triglycerides   . 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 Nasal turbinate hypertrophy 09/2013  . OCD (obsessive compulsive disorder) 04/02/2014  . Sleep apnea    Past Surgical History:  Procedure Laterality Date  . APPENDECTOMY    . LUMBAR LAMINECTOMY/DECOMPRESSION MICRODISCECTOMY Left 12/02/2017   Procedure: Left Lumbar 4-5 disectomy;  Surgeon: Venita Lick, MD;  Location: [facility] OR;  Service: Orthopedics;  Laterality: Left;  2 hrs  . NASAL SEPTOPLASTY W/ TURBINOPLASTY Bilateral 10/02/2013   Procedure: BILATERAL NASAL SEPTOPLASTY WITH TURBINATE REDUCTION;  Surgeon: Serena Colonel, MD;  Location: [facility];  Service: ENT;  Laterality: Bilateral;  . SHOULDER ARTHROSCOPY Right 11/2013  . VASECTOMY  06/02/2013   Current Outpatient Medications on File Prior to Visit  Medication Sig Dispense Refill  . APPLE CIDER VINEGAR PO Take by mouth. Raw ASV 150    . Ascorbic Acid (VITAMIN C) 1000 MG tablet Take 1,000 mg by mouth 3 (three) times daily.      . Cholecalciferol ([facility]S VIT D 5000 HIGH-POTENCY PO) Take by mouth. One daily    . clonazePAM (KLONOPIN) 1 MG tablet TAKE 1 TABLET (1MG ) BY MOUTH THREE TIMES DAILY AS NEEDED FOR ANXIETY 90 tablet 3  . Milk Thistle 150 MG CAPS Take by mouth. Takes one daily    . sertraline (ZOLOFT) 100 MG tablet Take 1 tablet (100 mg total) by mouth daily. 90 tablet 2  . Turmeric 400 MG CAPS Take by mouth. Takes one daily     No current facility-administered medications on file prior to visit.   Allergies  Allergen Reactions  . Cephalexin Hives  . Other Hives, Itching and Rash   Social History   Socioeconomic History  . Marital status: Married    Spouse name: Not on file  . Number of children: Not on file  . Years of education: Not on file  . Highest education level: Not on file  Occupational History  . Not on file  Tobacco Use  . Smoking status: Never Smoker  . Smokeless tobacco: Never Used  Vaping Use  . Vaping Use: Never used  Substance and Sexual Activity  . Alcohol use: No    Comment: history of alcohol use in his 55s, none now   . Drug use: Yes    Types:  Marijuana  . Sexual activity: Yes  Other Topics Concern  . Not on file  Social History Narrative  . Not on file   Social Determinants of Health   Financial Resource Strain:   . Difficulty of Paying Living Expenses: Not on file  Food Insecurity:   . Worried About Programme researcher, broadcasting/film/video in the Last Year: Not on file  . Ran Out of Food in the Last Year: Not on file  Transportation Needs:   . Lack of Transportation (Medical): Not on file  . Lack of Transportation (Non-Medical): Not on file  Physical Activity:   . Days of Exercise per Week: Not on file  . Minutes of Exercise per Session: Not on file  Stress:   . Feeling of Stress : Not on file  Social Connections:   . Frequency of Communication with Friends and Family: Not on file  . Frequency of Social Gatherings with Friends and Family: Not on file  . Attends Religious  Services: Not on file  . Active Member of Clubs or Organizations: Not on file  . Attends Banker Meetings: Not on file  . Marital Status: Not on file  Intimate Partner Violence:   . Fear of Current or Ex-Partner: Not on file  . Emotionally Abused: Not on file  . Physically Abused: Not on file  . Sexually Abused: Not on file      Review of Systems  All other systems reviewed and are negative.      Objective:   Physical Exam Constitutional:      Appearance: Normal appearance.  Skin:    Coloration: Skin is not jaundiced or pale.     Findings: Rash present. No bruising or erythema. Rash is macular and papular. Rash is not crusting or pustular.       Neurological:     Mental Status: He is alert.           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  I believe a fungal infection would be less likely.  He denies any rash on his feet.  Therefore I will treat the patient with triamcinolone cream twice daily for 10 to 14 days.  Recommended a recheck if not better at that time for possible biopsy.  I would need to see the rash in person to have a more accurate diagnosis

## 2020-10-11 ENCOUNTER — Encounter: Payer: Self-pay | Admitting: Family Medicine

## 2020-10-15 ENCOUNTER — Other Ambulatory Visit: Payer: Self-pay | Admitting: Family Medicine

## 2020-10-15 MED ORDER — PREDNISONE 20 MG PO TABS
ORAL_TABLET | ORAL | 0 refills | Status: DC
Start: 2020-10-15 — End: 2021-04-25

## 2020-10-21 MED ORDER — TRIAMCINOLONE ACETONIDE 0.1 % EX CREA: 1.0000 "application " | TOPICAL_CREAM | Freq: Two times a day (BID) | CUTANEOUS | 1 refills | Status: DC

## 2020-10-30 ENCOUNTER — Telehealth: Payer: Self-pay | Admitting: Family Medicine

## 2020-10-30 NOTE — Telephone Encounter (Signed)
Debra wife of patients states he was unable to fly home from [place] due to a panic attack. She states he had to get off the plane while passengers were loading the plane. She would like to know if something could be called in for him to the [facility] in [place] that is on file for patient so he can fly home on Sunday.  CB# [phone]

## 2020-10-30 NOTE — Telephone Encounter (Signed)
Please advise 

## 2020-10-31 ENCOUNTER — Other Ambulatory Visit: Payer: Self-pay | Admitting: Family Medicine

## 2020-10-31 MED ORDER — DIAZEPAM 10 MG PO TABS: 10.0000 mg | ORAL_TABLET | Freq: Two times a day (BID) | ORAL | 0 refills | Status: DC | PRN

## 2020-10-31 NOTE — Telephone Encounter (Signed)
Call placed to patient and patient made aware per VM.  

## 2020-10-31 NOTE — Telephone Encounter (Signed)
Call placed to patient. LMTRC.  

## 2020-10-31 NOTE — Telephone Encounter (Signed)
I sent [facility]ium to [facility].

## 2020-12-10 ENCOUNTER — Other Ambulatory Visit: Payer: Self-pay | Admitting: Family Medicine

## 2020-12-10 MED ORDER — CLONAZEPAM 1 MG PO TABS: ORAL_TABLET | ORAL | 3 refills | Status: DC

## 2020-12-10 NOTE — Telephone Encounter (Signed)
Ok to refill??  Last office visit 10/08/2020.  Last refill 07/11/2020, #3 refills.

## 2020-12-10 NOTE — Telephone Encounter (Signed)
Pt wife called needing refill of  clonazePAM (KLONOPIN) 1 MG tablet [[id number]]  Sent to [facility] on [address]

## 2021-04-25 ENCOUNTER — Other Ambulatory Visit: Payer: Self-pay

## 2021-04-25 ENCOUNTER — Ambulatory Visit (INDEPENDENT_AMBULATORY_CARE_PROVIDER_SITE_OTHER): Payer: 59 | Admitting: Nurse Practitioner

## 2021-04-25 ENCOUNTER — Encounter: Payer: Self-pay | Admitting: Nurse Practitioner

## 2021-04-25 ENCOUNTER — Emergency Department (HOSPITAL_COMMUNITY)
Admission: EM | Admit: 2021-04-25 | Discharge: 2021-04-25 | Disposition: A | Discharge status: Home / Self Care | Payer: 59 | Attending: Emergency Medicine | Admitting: Emergency Medicine

## 2021-04-25 ENCOUNTER — Emergency Department (HOSPITAL_COMMUNITY): Payer: 59

## 2021-04-25 ENCOUNTER — Encounter (HOSPITAL_COMMUNITY): Payer: Self-pay

## 2021-04-25 VITALS — BP 160/120 | HR 84 | Temp 98.4°F | Ht 69.0 in | Wt 255.8 lb

## 2021-04-25 DIAGNOSIS — N4889 Other specified disorders of penis: Secondary | ICD-10-CM | POA: Diagnosis not present

## 2021-04-25 DIAGNOSIS — R3129 Other microscopic hematuria: Secondary | ICD-10-CM | POA: Diagnosis not present

## 2021-04-25 DIAGNOSIS — R319 Hematuria, unspecified: Secondary | ICD-10-CM | POA: Diagnosis not present

## 2021-04-25 DIAGNOSIS — R300 Dysuria: Secondary | ICD-10-CM | POA: Insufficient documentation

## 2021-04-25 DIAGNOSIS — R350 Frequency of micturition: Secondary | ICD-10-CM | POA: Insufficient documentation

## 2021-04-25 DIAGNOSIS — N200 Calculus of kidney: Secondary | ICD-10-CM

## 2021-04-25 LAB — COMPREHENSIVE METABOLIC PANEL
ALT: 107 U/L — ABNORMAL HIGH (ref 0–44)
AST: 45 U/L — ABNORMAL HIGH (ref 15–41)
Albumin: 5 g/dL (ref 3.5–5.0)
Alkaline Phosphatase: 53 U/L (ref 38–126)
Anion gap: 10 (ref 5–15)
BUN: 19 mg/dL (ref 6–20)
CO2: 26 mmol/L (ref 22–32)
Calcium: 9.5 mg/dL (ref 8.9–10.3)
Chloride: 103 mmol/L (ref 98–111)
Creatinine, Ser: 1.12 mg/dL (ref 0.61–1.24)
GFR, Estimated: >60 mL/min (ref >60)
Glucose, Bld: 90 mg/dL (ref 70–99)
Potassium: 4.1 mmol/L (ref 3.5–5.1)
Sodium: 139 mmol/L (ref 135–145)
Total Bilirubin: 0.6 mg/dL (ref 0.3–1.2)
Total Protein: 8.1 g/dL (ref 6.5–8.1)

## 2021-04-25 LAB — CBC WITH DIFFERENTIAL/PLATELET
Abs Immature Granulocytes: 0.02 10*3/uL (ref 0.00–0.07)
Basophils Absolute: 0.1 10*3/uL (ref 0.0–0.1)
Basophils Relative: 1 %
Eosinophils Absolute: 0.2 10*3/uL (ref 0.0–0.5)
Eosinophils Relative: 2 %
HCT: 51.6 % (ref 39.0–52.0)
Hemoglobin: 17.7 g/dL — ABNORMAL HIGH (ref 13.0–17.0)
Immature Granulocytes: 0 %
Lymphocytes Relative: 34 %
Lymphs Abs: 3.3 10*3/uL (ref 0.7–4.0)
MCH: 30.2 pg (ref 26.0–34.0)
MCHC: 34.3 g/dL (ref 30.0–36.0)
MCV: 87.9 fL (ref 80.0–100.0)
Monocytes Absolute: 0.9 10*3/uL (ref 0.1–1.0)
Monocytes Relative: 9 %
Neutro Abs: 5.1 10*3/uL (ref 1.7–7.7)
Neutrophils Relative %: 54 %
Platelets: 274 10*3/uL (ref 150–400)
RBC: 5.87 MIL/uL — ABNORMAL HIGH (ref 4.22–5.81)
RDW: 13.2 % (ref 11.5–15.5)
WBC: 9.5 10*3/uL (ref 4.0–10.5)
nRBC: 0 % (ref 0.0–0.2)

## 2021-04-25 LAB — URINALYSIS, ROUTINE W REFLEX MICROSCOPIC
Bacteria, UA: NONE SEEN /HPF
Bilirubin Urine: NEGATIVE
Glucose, UA: NEGATIVE mg/dL
Hyaline Cast: NONE SEEN /LPF
Ketones, ur: NEGATIVE mg/dL
Leukocytes,Ua: NEGATIVE
Nitrite: POSITIVE — AB
Protein, ur: 30 mg/dL — AB
RBC / HPF: >50 RBC/hpf — ABNORMAL HIGH (ref 0–5)
Specific Gravity, Urine: 1.02 (ref 1.005–1.030)
Squamous Epithelial / HPF: NONE SEEN /HPF (ref <=5)
WBC, UA: NONE SEEN /HPF (ref 0–5)
pH: 5 (ref 5.0–8.0)

## 2021-04-25 MED ORDER — TAMSULOSIN HCL 0.4 MG PO CAPS: 0.4000 mg | ORAL_CAPSULE | Freq: Every day | ORAL | 0 refills | Status: DC

## 2021-04-25 MED ORDER — ONDANSETRON 8 MG PO TBDP
8.0000 mg | ORAL_TABLET | Freq: Once | ORAL | Status: AC
  Administered 2021-04-25: 8 mg via ORAL
  Filled 2021-04-25: qty 1

## 2021-04-25 MED ORDER — ONDANSETRON HCL 4 MG/2ML IJ SOLN
4.0000 mg | Freq: Once | INTRAMUSCULAR | Status: AC
  Administered 2021-04-25: 4 mg via INTRAVENOUS
  Filled 2021-04-25: qty 2

## 2021-04-25 MED ORDER — KETOROLAC TROMETHAMINE 30 MG/ML IJ SOLN
30.0000 mg | Freq: Once | INTRAMUSCULAR | Status: AC
  Administered 2021-04-25: 30 mg via INTRAMUSCULAR
  Filled 2021-04-25: qty 1

## 2021-04-25 MED ORDER — HYDROMORPHONE HCL 1 MG/ML IJ SOLN
1.0000 mg | Freq: Once | INTRAMUSCULAR | Status: AC
  Administered 2021-04-25: 1 mg via INTRAVENOUS
  Filled 2021-04-25: qty 1

## 2021-04-25 MED ORDER — ONDANSETRON HCL 4 MG PO TABS: 4.0000 mg | ORAL_TABLET | Freq: Four times a day (QID) | ORAL | 0 refills | Status: DC

## 2021-04-25 MED ORDER — MORPHINE SULFATE (PF) 4 MG/ML IV SOLN
4.0000 mg | Freq: Once | INTRAVENOUS | Status: AC
  Administered 2021-04-25: 4 mg via INTRAVENOUS
  Filled 2021-04-25: qty 1

## 2021-04-25 MED ORDER — OXYCODONE-ACETAMINOPHEN 5-325 MG PO TABS: 1.0000 | ORAL_TABLET | ORAL | 0 refills | Status: DC | PRN

## 2021-04-25 MED ORDER — FENTANYL CITRATE (PF) 100 MCG/2ML IJ SOLN
50.0000 ug | Freq: Once | INTRAMUSCULAR | Status: AC
  Administered 2021-04-25: 50 ug via INTRAVENOUS

## 2021-04-25 MED ORDER — FENTANYL CITRATE (PF) 100 MCG/2ML IJ SOLN
50.0000 ug | Freq: Once | INTRAMUSCULAR | Status: DC
  Filled 2021-04-25: qty 2

## 2021-04-25 MED ORDER — FENTANYL CITRATE (PF) 100 MCG/2ML IJ SOLN
50.0000 ug | Freq: Once | INTRAMUSCULAR | Status: AC
  Administered 2021-04-25: 50 ug via INTRAVENOUS
  Filled 2021-04-25: qty 2

## 2021-04-25 NOTE — Progress Notes (Signed)
Subjective:    Patient ID: Robert Mcpherson, male    DOB: [birth year], 45 y.o.   MRN: [id number]  HPI: Robert Mcpherson is a 45 y.o. male presenting for "I cannot urinate."  Chief Complaint  Patient presents with   urine problem    Feels like he cannot empty bladder, has more dripping than urine stream, no pain, just urge to urinate and can not go. Can not void right now   URINARY SYMPTOMS Patient reports he is only able to get out drips of urine.  Reports UTI a few months ago. Duration: week ; got worse last night  Dysuria: no Urinary frequency: yes Urgency: no Small volume voids: yes Symptom severity: mo Urinary incontinence: no Foul odor: no Fever: no Weight change: none unexpected New muscle aches/joint pain Hematuria: no Abdominal pain: no Back pain: no Bowel movement: more frequent, hard recently Suprapubic pain/pressure: no Flank pain: no Fever:  no Nausea: nmo Vomiting: no Relief with cranberry juice: no Relief with pyridium: no Status: worse Previous urinary tract infection: yes Recurrent urinary tract infection: no Sexual activity:  Penile discharge: no Treatments attempted: pyridium Had back surgery in 2019 - no recent falls or injuries  Allergies  Allergen Reactions   Cephalexin Hives   Other Hives, Itching and Rash    Outpatient Encounter Medications as of 04/25/2021  Medication Sig   Ascorbic Acid (VITAMIN C) 1000 MG tablet Take 1,000 mg by mouth 3 (three) times daily.   Cholecalciferol ([facility]S VIT D 5000 HIGH-POTENCY PO) Take by mouth. One daily   clonazePAM (KLONOPIN) 1 MG tablet TAKE 1 TABLET (1MG ) BY MOUTH THREE TIMES DAILY AS NEEDED FOR ANXIETY   clonazePAM (KLONOPIN) 1 MG tablet clonazepam 1 mg tablet   diazepam (VALIUM) 10 MG tablet Take 1 tablet (10 mg total) by mouth every 12 (twelve) hours as needed for anxiety (take 1 hour before plane flight.).   Milk Thistle 150 MG CAPS Take by mouth. Takes one daily   sertraline (ZOLOFT) 100 MG tablet  Take 1 tablet (100 mg total) by mouth daily.   triamcinolone (KENALOG) 0.1 % Apply 1 application topically 2 (two) times daily.   Turmeric 400 MG CAPS Take by mouth. Takes one daily   [DISCONTINUED] predniSONE (DELTASONE) 20 MG tablet 3 tabs poqday 1-2, 2 tabs poqday 3-4, 1 tab poqday 5-6   [DISCONTINUED] APPLE CIDER VINEGAR PO Take by mouth. Raw ASV 150   No facility-administered encounter medications on file as of 04/25/2021.    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 Interim medical history since our last visit reviewed.  Review of Systems Per HPI unless specifically indicated above     Objective:    BP (!) 160/120   Pulse 84   Temp 98.4  F (36.9 C)   Ht 5\' 9"  (1.753 m)   Wt 255 lb 12.8 oz (116 kg)   SpO2 94%   BMI 37.78 kg/m   Wt Readings from Last 3 Encounters:  04/25/21 255 lb 12.8 oz (116 kg)  09/20/20 267 lb (121.1 kg)  09/06/18 261 lb (118.4 kg)    Physical Exam Vitals and nursing note reviewed.  Constitutional:      General: He is not in acute distress.    Appearance: He is not toxic-appearing.     Comments: Uncomfortable appearing  Eyes:     General: No scleral icterus.    Extraocular Movements: Extraocular movements intact.  Abdominal:     General: Abdomen is flat. Bowel sounds are  normal. There is no distension.     Palpations: Abdomen is soft.  Genitourinary:    Comments: Deferred - patient declined Skin:    General: Skin is warm and dry.     Coloration: Skin is not jaundiced or pale.     Findings: No erythema.  Neurological:     Mental Status: He is alert and oriented to person, place, and time.  Psychiatric:        Mood and Affect: Mood normal.        Behavior: Behavior normal.        Thought Content: Thought content normal.        Judgment: Judgment normal.    Results for orders placed or performed in visit on 04/25/21  Urinalysis, Routine w reflex microscopic  Result Value Ref Range   Color, Urine ORANGE (A) YELLOW   APPearance CLEAR CLEAR   Specific Gravity, Urine CANCELED    WBC, UA NONE SEEN 0 - 5 /HPF   RBC / HPF PACKED (A) 0 - 2 /HPF   Squamous Epithelial / LPF NONE SEEN < OR = 5 /HPF   Bacteria, UA NONE SEEN NONE SEEN /HPF   Hyaline Cast NONE SEEN NONE SEEN /LPF      Assessment & Plan:  1. Other microscopic hematuria Acute.  Patient has acute urinary retention related to hematuria.  Needs urgent Urologic evaluation as he is extremely uncomfortable.  Adamantly refuses foley catheter in our office.  I explained to him that it is going to be a necessary procedure to allow the flow of urine to pass from his bladder.  He declines and wishes to wait until he gets to the hospital.  EMS called and transported patient to hospital.  Patient left office in stable condition.  - Urinalysis, Routine w reflex microscopic     Follow up plan: No follow-ups on file.

## 2021-04-25 NOTE — ED Notes (Signed)
This nurse entered the triage exam room to pull blood work from the IV that was placed by EMS. This nurse asked the pt, if he could please put his mask on while this nurse was in the ED exam room. The pt stated, "No ma'am I cannot I have PTSD." This nurse stated to the pt, "okay it is hospital policy and we need to keep each other safe right?" The pt then stated to the nurse, "I don't want to sit in here and listen to politics." This nurse then stated to the pt, "Sir I do not know what you're talking about - I just arrived to the triage area." The pt then aggressively stated to this nurse, "and I'm pissed off because I don't want to sit here and listen to politics while I'm paying $7,000 to be here." This nurse then stated to the pt, "Sir I have nothing to do with how much it costs for you to be here." The pt then stated to the nurse, "you need to go get someone else." This nurse then exited the exam room.

## 2021-04-25 NOTE — ED Triage Notes (Signed)
Per EMS-coming from PCP-dysuria for over a week-decreasing urinary output-blood in urine, penis pain-100 mcg of Fentanyl given in route

## 2021-04-25 NOTE — Discharge Instructions (Addendum)
You were seen today in the emergency department diagnosed with a kidney stone.  This will pass on its own, but it would likely be painful until it does.  Expected to pass in the next few days, please take pain medicine as needed and as prescribed.  Have also sent you home with some Zofran which is medicine used to treat nausea.  You take this every 6 hours as needed.  I also written you prescription for Flomax, although there is mixed evidence I believe it can improve the passage of the kidney stone.  If conditions change or worsen please return back to the emergency department as needed.

## 2021-04-25 NOTE — ED Provider Notes (Signed)
[facility]-EMERGENCY DEPT Provider Note   CSN: [id number] Arrival date & time: 04/25/21  1415     History Chief Complaint  Patient presents with   Hematuria   Urinary Retention   bladder pain    Robert Mcpherson is a 45 y.o. male.  HPI  Patient presents with dysuria, hematuria, increased urinary frequency, decreased urinary stream x1 week.  States he had similar symptoms a month ago and took Azo over-the-counter which resolved the symptoms.  they started again 1 week ago, but yesterday got really bad.  Patient states he urinated over 26 times yesterday night.  He denies any fever, nausea, vomiting, chills, testicular swelling.  He does endorse pain in the penis area.  Patient reports has been married 16 years and has not had sexual intercourse with anybody other than his wife.  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   Nasal turbinate hypertrophy 09/2013   OCD (obsessive compulsive disorder) 04/02/2014   Sleep apnea     Patient Active Problem List   Diagnosis Date Noted   Obstructive sleep apnea treated with continuous positive airway pressure (CPAP) 01/11/2019   Sleep apnea    Abnormal transaminases 06/03/2018   S/P lumbar microdiscectomy 12/02/2017   OCD (obsessive compulsive disorder) 04/02/2014   Anxiety    Generalized anxiety disorder 02/06/2013    Past Surgical History:  Procedure Laterality Date   APPENDECTOMY     LUMBAR LAMINECTOMY/DECOMPRESSION MICRODISCECTOMY Left 12/02/2017   Procedure: Left Lumbar 4-5 disectomy;  Surgeon: Venita Lick, MD;  Location: [facility] OR;  Service: Orthopedics;  Laterality: Left;  2 hrs   NASAL SEPTOPLASTY W/ TURBINOPLASTY Bilateral 10/02/2013   Procedure: BILATERAL NASAL SEPTOPLASTY WITH TURBINATE REDUCTION;  Surgeon: Serena Colonel, MD;  Location: [facility];  Service:  ENT;  Laterality: Bilateral;   SHOULDER ARTHROSCOPY Right 11/2013   VASECTOMY  06/02/2013       Family History  Problem Relation Age of Onset   Heart disease Maternal Uncle 52       heart attack   Heart disease Maternal Uncle 31       CVA   Colon cancer Neg Hx    Colon polyps Neg Hx    Liver disease Neg Hx     Social History   Tobacco Use   Smoking status: Never   Smokeless tobacco: Never  Vaping Use   Vaping Use: Never used  Substance Use Topics   Alcohol use: No    Comment: history of alcohol use in his 20s, none now    Drug use: Yes    Types: Marijuana    Home Medications Prior to Admission medications   Medication Sig Start Date End Date Taking? Authorizing Provider  Ascorbic Acid (VITAMIN C) 1000 MG tablet Take 1,000 mg by mouth 3 (three) times daily.    [provider]  Cholecalciferol ([facility]S VIT D 5000 HIGH-POTENCY PO) Take by mouth. One daily    [provider]  clonazePAM (KLONOPIN) 1 MG tablet TAKE 1 TABLET (1MG ) BY MOUTH THREE TIMES DAILY AS NEEDED FOR ANXIETY 12/10/20   02/07/21, MD  clonazePAM Donita Brooks) 1 MG tablet clonazepam 1 mg tablet 09/08/18   [provider]  diazepam (VALIUM) 10 MG tablet Take 1 tablet (10 mg total) by mouth every 12 (  twelve) hours as needed for anxiety (take 1 hour before plane flight.). 10/31/20   Donita Brooks, MD  Milk Thistle 150 MG CAPS Take by mouth. Takes one daily    [provider]  sertraline (ZOLOFT) 100 MG tablet Take 1 tablet (100 mg total) by mouth daily. 10/03/20   Donita Brooks, MD  triamcinolone (KENALOG) 0.1 % Apply 1 application topically 2 (two) times daily. 10/21/20   Donita Brooks, MD  Turmeric 400 MG CAPS Take by mouth. Takes one daily    [provider]    Allergies    Cephalexin and Other  Review of Systems   Review of Systems  Constitutional:  Negative for chills, fatigue and fever.  HENT:  Negative for ear pain and sore throat.   Eyes:   Negative for pain and visual disturbance.  Respiratory:  Negative for cough and shortness of breath.   Cardiovascular:  Negative for chest pain and palpitations.  Gastrointestinal:  Negative for abdominal pain and vomiting.  Genitourinary:  Positive for decreased urine volume, dysuria, hematuria, penile pain and urgency. Negative for difficulty urinating, flank pain, penile discharge, penile swelling, scrotal swelling and testicular pain.  Musculoskeletal:  Negative for arthralgias and back pain.  Skin:  Negative for color change and rash.  Neurological:  Negative for seizures and syncope.  All other systems reviewed and are negative.  Physical Exam Updated Vital Signs BP (!) 152/117 (BP Location: Left Arm)   Pulse 89   Temp 98.5 F (36.9 C) (Oral)   Resp 18   Ht 5\' 9"  (1.753 m)   Wt 116 kg   SpO2 95%   BMI 37.78 kg/m   Physical Exam Vitals and nursing note reviewed. Exam conducted with a chaperone present.  Constitutional:      Appearance: Normal appearance. He is obese.  HENT:     Head: Normocephalic and atraumatic.  Eyes:     General: No scleral icterus.       Right eye: No discharge.        Left eye: No discharge.     Extraocular Movements: Extraocular movements intact.     Pupils: Pupils are equal, round, and reactive to light.  Cardiovascular:     Rate and Rhythm: Normal rate and regular rhythm.     Pulses: Normal pulses.     Heart sounds: Normal heart sounds. No murmur heard.   No friction rub. No gallop.  Pulmonary:     Effort: Pulmonary effort is normal. No respiratory distress.     Breath sounds: Normal breath sounds.  Abdominal:     General: Abdomen is flat. Bowel sounds are normal. There is no distension.     Palpations: Abdomen is soft.     Tenderness: There is abdominal tenderness. There is no right CVA tenderness or left CVA tenderness.     Comments: Patient has suprapubic tenderness to palpation.  Negative CVA tenderness.  Skin:    General: Skin is  warm and dry.     Coloration: Skin is not jaundiced.  Neurological:     Mental Status: He is alert. Mental status is at baseline.     Coordination: Coordination normal.    ED Results / Procedures / Treatments   Labs (all labs ordered are listed, but only abnormal results are displayed) Labs Reviewed  CBC WITH DIFFERENTIAL/PLATELET - Abnormal; Notable for the following components:      Result Value   RBC 5.87 (*)    Hemoglobin 17.7 (*)  All other components within normal limits  URINE CULTURE  URINALYSIS, ROUTINE W REFLEX MICROSCOPIC  COMPREHENSIVE METABOLIC PANEL  GC/CHLAMYDIA PROBE AMP ([place]) NOT AT [facility]    EKG None  Radiology No results found.  Procedures Procedures   Medications Ordered in ED Medications - No data to display  ED Course  I have reviewed the triage vital signs and the nursing notes.  Pertinent labs & imaging results that were available during my care of the patient were reviewed by me and considered in my medical decision making (see chart for details).  Clinical Course as of 04/25/21 2243  Fri Apr 25, 2021  2101 Patient reports improvement in his pain.  He states he is feeling significantly better. [HS]    Clinical Course User Index [HS] Theron Arista, PA-C   MDM Rules/Calculators/A&P                          Patient is a 45 year old male presenting with dysuria and hematuria.  The amount of pain he is in, there is some suspicion for a kidney stone.  Basic labs have been ordered, will also order a CT abdomen without contrast.  There is also concern for prostatitis, if urine comes back positive we will treat for coverage of both.  Urine is notable for nitriles, believe this is due to taking AZO.  Doubt UTI. CT abdomen is revealing for kidney stone.  Patient reports improvement of pain.  His vitals are stable, he is nontoxic-appearing.  Will discharge patient with Flomax, pain medicine, Zofran.  Discussed kidney stones as well as what to  expect as it passes.  Return precautions given.  Patient is agreeable to plan and voices understanding.  Patient is appropriate for discharge.  Final Clinical Impression(s) / ED Diagnoses Final diagnoses:  None    Rx / DC Orders ED Discharge Orders     None        Theron Arista, Cordelia Poche 04/25/21 2244    Little, Ambrose Finland, MD 04/26/21 1728

## 2021-04-25 NOTE — ED Provider Notes (Addendum)
Emergency Medicine Provider Triage Evaluation Note  Robert Mcpherson , a 45 y.o. male  was evaluated in triage.  Pt complains of hematuria, urinary frequency, and dysuria for the last several days, worsening last night.  Denies anticoagulation.  Review of Systems  Positive: Hematuria, dysuria Negative: Scrotal pain/swelling, pain with bowel movements, abdominal pain, fever, flank/back pain  Physical Exam  BP (!) 152/117 (BP Location: Left Arm)   Pulse 89   Temp 98.5 F (36.9 C) (Oral)   Resp 18   Ht 5\' 9"  (1.753 m)   Wt 116 kg   SpO2 95%   BMI 37.78 kg/m  Gen:   Awake, no distress   Resp:  Normal effort  MSK:   Moves extremities without difficulty  Other:    Medical Decision Making  Medically screening exam initiated at 3:04 PM.  Appropriate orders placed.  Robert Mcpherson was informed that the remainder of the evaluation will be completed by another provider, this initial triage assessment does not replace that evaluation, and the importance of remaining in the ED until their evaluation is complete.    May consider CT abdomen/pelvis with or without contrast, depending on results of patient's kidney function.   Wallis Bamberg, PA-C 04/25/21 1612    04/27/21, PA-C 04/25/21 1612    04/27/21, MD 04/30/21 [phone]

## 2021-04-25 NOTE — ED Notes (Signed)
Patient transported to CT 

## 2021-04-27 LAB — URINE CULTURE: Culture: NO GROWTH

## 2021-04-28 LAB — GC/CHLAMYDIA PROBE AMP (~~LOC~~) NOT AT ARMC
Chlamydia: NEGATIVE
Comment: NEGATIVE
Comment: NORMAL
Neisseria Gonorrhea: NEGATIVE

## 2021-05-24 ENCOUNTER — Other Ambulatory Visit: Payer: Self-pay | Admitting: Family Medicine

## 2021-06-23 ENCOUNTER — Ambulatory Visit (INDEPENDENT_AMBULATORY_CARE_PROVIDER_SITE_OTHER): Payer: 59 | Admitting: Family Medicine

## 2021-06-23 ENCOUNTER — Other Ambulatory Visit: Payer: Self-pay

## 2021-06-23 LAB — LIPID PANEL
Cholesterol: 238 mg/dL — ABNORMAL HIGH (ref <200)
HDL: 37 mg/dL — ABNORMAL LOW (ref >=40)
LDL Cholesterol (Calc): 164 mg/dL (calc) — ABNORMAL HIGH
Non-HDL Cholesterol (Calc): 201 mg/dL (calc) — ABNORMAL HIGH (ref <130)
Total CHOL/HDL Ratio: 6.4 (calc) — ABNORMAL HIGH (ref <5.0)
Triglycerides: 207 mg/dL — ABNORMAL HIGH (ref <150)

## 2021-06-23 LAB — CBC WITH DIFFERENTIAL/PLATELET
Absolute Monocytes: 452 cells/uL (ref 200–950)
Basophils Absolute: 62 cells/uL (ref 0–200)
Basophils Relative: 0.8 %
Eosinophils Absolute: 148 cells/uL (ref 15–500)
Eosinophils Relative: 1.9 %
HCT: 50.7 % — ABNORMAL HIGH (ref 38.5–50.0)
Hemoglobin: 17.6 g/dL — ABNORMAL HIGH (ref 13.2–17.1)
Lymphs Abs: 3050 cells/uL (ref 850–3900)
MCH: 30.1 pg (ref 27.0–33.0)
MCHC: 34.7 g/dL (ref 32.0–36.0)
MCV: 86.8 fL (ref 80.0–100.0)
MPV: 10 fL (ref 7.5–12.5)
Monocytes Relative: 5.8 %
Neutro Abs: 4087 cells/uL (ref 1500–7800)
Neutrophils Relative %: 52.4 %
Platelets: 269 10*3/uL (ref 140–400)
RBC: 5.84 10*6/uL — ABNORMAL HIGH (ref 4.20–5.80)
RDW: 13.3 % (ref 11.0–15.0)
Total Lymphocyte: 39.1 %
WBC: 7.8 10*3/uL (ref 3.8–10.8)

## 2021-06-23 LAB — COMPLETE METABOLIC PANEL WITH GFR
AG Ratio: 1.8 (calc) (ref 1.0–2.5)
ALT: 90 U/L — ABNORMAL HIGH (ref 9–46)
AST: 32 U/L (ref 10–40)
Albumin: 4.8 g/dL (ref 3.6–5.1)
Alkaline phosphatase (APISO): 53 U/L (ref 36–130)
BUN: 16 mg/dL (ref 7–25)
CO2: 30 mmol/L (ref 20–32)
Calcium: 9.8 mg/dL (ref 8.6–10.3)
Chloride: 104 mmol/L (ref 98–110)
Creat: 1 mg/dL (ref 0.60–1.29)
Globulin: 2.6 g/dL (calc) (ref 1.9–3.7)
Glucose, Bld: 98 mg/dL (ref 65–99)
Potassium: 4.7 mmol/L (ref 3.5–5.3)
Sodium: 141 mmol/L (ref 135–146)
Total Bilirubin: 0.5 mg/dL (ref 0.2–1.2)
Total Protein: 7.4 g/dL (ref 6.1–8.1)
eGFR: 96 mL/min/{1.73_m2} (ref >=60)

## 2021-06-24 ENCOUNTER — Other Ambulatory Visit: Payer: Self-pay | Admitting: Family Medicine

## 2021-06-25 NOTE — Telephone Encounter (Signed)
Ok to refill??  Last office visit 06/23/2021.  Last refill 05/26/2021.

## 2021-06-26 ENCOUNTER — Other Ambulatory Visit: Payer: Self-pay | Admitting: *Deleted

## 2021-06-26 ENCOUNTER — Encounter: Payer: Self-pay | Admitting: Family Medicine

## 2021-06-26 DIAGNOSIS — E7849 Other hyperlipidemia: Secondary | ICD-10-CM

## 2021-06-26 DIAGNOSIS — E785 Hyperlipidemia, unspecified: Secondary | ICD-10-CM | POA: Insufficient documentation

## 2021-06-26 MED ORDER — ROSUVASTATIN CALCIUM 20 MG PO TABS: 20.0000 mg | ORAL_TABLET | Freq: Every day | ORAL | 3 refills | Status: DC

## 2021-06-27 ENCOUNTER — Telehealth: Payer: Self-pay | Admitting: Adult Health

## 2021-06-27 NOTE — Telephone Encounter (Signed)
[name] called wanting to know if the pt can have another VV for his follow up. Pt's last appt was a VV.

## 2021-06-30 NOTE — Telephone Encounter (Signed)
[name] returned call and we discussed JM, NP note.  Wife sts the pt has not been compliant with CPAP due to supplies being out of date and [facility] being unable to reorder.  I spoke with JM, NP verbally about this and she states ok to schedule visit so reorder of supplies can be placed.  Virtual visit scheduled for tomorrow at 115.

## 2021-06-30 NOTE — Telephone Encounter (Signed)
He isn't compliant with CPAP. Last 30 day report 0% compliance and 90 day report 20% compliance. Can schedule a visit to discuss any issues related to compliance if they have questions but otherwise if he is not compliant with therapy, no indication for visit.

## 2021-06-30 NOTE — Telephone Encounter (Signed)
LVM for wife advising her of NP's message. Requested wife call back to discuss.

## 2021-07-01 ENCOUNTER — Telehealth: Payer: 59 | Admitting: Adult Health

## 2021-07-01 NOTE — Progress Notes (Unsigned)
[facility]  PATIENT: Robert Mcpherson DOB: Apr 16, 1976   [facility] provider: Dr. Frances FurbishAthar REASON FOR VISIT: OSA on CPAP HISTORY FROM: Patient  Virtual Visit via Video Note  I connected with Robert Mcpherson on 07/01/21 at  1:15 PM EDT by a video enabled telemedicine application [facility] and verified that I am speaking with the correct person using two identifiers who was located ***.   Visit scheduled by Ann MakiMegan Bailey, RN who discussed the limitations of evaluation and management by telemedicine and the availability of in person appointments. The patient expressed understanding and agreed to proceed.    HISTORY OF PRESENT ILLNESS:  Update 07/01/2021 JM: Returns for yearly CPAP follow-up via MyChart video visit per pt request.  He has been noncompliant with CPAP due to needing new supplies. Reports he has been out of supplies since end of June.         History provided for reference purposes only Update 04/30/2020 JM: Robert Mcpherson is being seen via virtual visit for CPAP follow-up.  Compliance report from 12/15/2019 -01/13/2020 shows 19 out of 30 usage days with 15 days greater than 4 hours for a 50% compliance.  Average usage 4 hours and 52 minutes.  Residual AHI 0.8 with min pressure 7 and max pressure 15 with a EPR level 1.  Pressure in the 95th percentile 11.5.  Leaks in the 95th percentile 0.9.  He reports having difficulty using prior mask due to nasal discomfort hence the reason for noncompliance He has noticed increased daytime fatigue and difficulty sleeping at night when not using CPAP  UPDATE 3/11/2020CM Robert Mcpherson, 45 year old male returns for follow-up with newly diagnosed severe obstructive sleep apnea here for his initial CPAP.  Patient claims he feels so much better, he is well rested every morning and he is sleeping so much better.  CPAP data dated 12/10/2018-01/08/2019 shows compliance greater than 4 hours at 100%.  Average usage 7 hours 4  minutes.  Set pressure 7 to 15 cm.  EPR level 1 leak 95th percentile 1.4.  AHI 0.7 ESS 4.  He returns for reevaluation.  Initial consult visit 09/06/18 Dr. Frances FurbishAthar: Robert Mcpherson is a 45 year old right-handed gentleman with an underlying medical history of hypertriglyceridemia, OCD, anxiety, depression, recent status post lumbar laminectomy, status post septoplasty and turbinate reduction, shoulder arthroscopic surgery, and history of obesity, who reports snoring and excessive daytime somnolence. I reviewed your office note from 06/10/2018. He had a sleep study 5 years ago which showed mild sleep apnea. I reviewed his PSG report from 09/11/2013, study was interpreted by Dr. Maple HudsonYoung. Of note, his weight was recorded to be 200 tablet at the time, BMI of 31. Overall AHI was 11 per hour. He achieved all stages of sleep with 15.9% of REM sleep, sleep latency was 45 minutes, REM latency 78 minutes. Average oxygen saturation was 93.2%, nadir was 83%. No significant PLMs. His Epworth sleepiness score is 10 out of 24 today, fatigue score is 56 out of 56 (one question was not answered, so total score is out of 56, not 63). Is married and lives with his wife and children. He has a total of 4 children and is self-employed, Radio broadcast assistantlandscaping business. He is a nonsmoker and drinks alcohol occasionally, maybe twice a month or so and drinks caffeine daily in the form of iced coffee about 2 per day, large servings, which helps him stay alert during the day. He reports significant weight gain secondary to back pain. He has  a family history of sleep apnea and has several uncles and cousins on CPAP machines. He would be willing to try CPAP. He did not have much in the way of improvement of his nasal breathing after nasal surgery. He does not keep a very set schedule for sleep. He reports sleeping some during the day and also somewhat night. Typical bedtime is after midnight, typically before 2 AM. Rise time is around 8. He takes a 3-4 hour nap every day  around 1 or 2 PM. He has 4 younger children, ages 73, 50, 31, and for. They are all homeschooled. He denies night to night nocturia but has had morning headaches. He has had mood irritability especially when he is sleep deprived or not sleeping well.  REVIEW OF SYSTEMS: Full 14 system review of systems performed and notable only for those listed, all others are neg:  Constitutional: neg  Cardiovascular: neg Ear/Nose/Throat: neg  Skin: neg Eyes: neg Respiratory: neg Gastroitestinal: neg  Hematology/Lymphatic: neg  Endocrine: neg Musculoskeletal:neg Allergy/Immunology: neg Neurological: neg Psychiatric: neg Sleep : Obstructive sleep apnea with CPAP   ALLERGIES: Allergies  Allergen Reactions   Cephalexin Hives   Other Hives, Itching and Rash    HOME MEDICATIONS: Outpatient Medications Prior to Visit  Medication Sig Dispense Refill   Ascorbic Acid (VITAMIN C) 1000 MG tablet Take 1,000 mg by mouth 3 (three) times daily.     Cholecalciferol ([employer] VIT D 5000 HIGH-POTENCY PO) Take by mouth. One daily     clonazePAM (KLONOPIN) 1 MG tablet TAKE 1 TABLET(1 MG) BY MOUTH THREE TIMES DAILY AS NEEDED FOR ANXIETY 90 tablet 0   diazepam (VALIUM) 10 MG tablet Take 1 tablet (10 mg total) by mouth every 12 (twelve) hours as needed for anxiety (take 1 hour before plane flight.). 10 tablet 0   Milk Thistle 150 MG CAPS Take by mouth. Takes one daily     ondansetron (ZOFRAN) 4 MG tablet Take 1 tablet (4 mg total) by mouth every 6 (six) hours. 12 tablet 0   oxyCODONE-acetaminophen (PERCOCET) 5-325 MG tablet Take 1 tablet by mouth every 4 (four) hours as needed for severe pain. 15 tablet 0   rosuvastatin (CRESTOR) 20 MG tablet Take 1 tablet (20 mg total) by mouth daily. 90 tablet 3   sertraline (ZOLOFT) 100 MG tablet Take 1 tablet (100 mg total) by mouth daily. 90 tablet 2   tamsulosin (FLOMAX) 0.4 MG CAPS capsule Take 1 capsule (0.4 mg total) by mouth daily. 30 capsule 0   triamcinolone (KENALOG) 0.1 %  Apply 1 application topically 2 (two) times daily. 30 g 1   Turmeric 400 MG CAPS Take by mouth. Takes one daily     No facility-administered medications prior to visit.    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   Nasal turbinate hypertrophy 09/2013   OCD (obsessive compulsive disorder) 04/02/2014   Sleep apnea     PAST SURGICAL HISTORY: Past Surgical History:  Procedure Laterality Date   APPENDECTOMY     LUMBAR LAMINECTOMY/DECOMPRESSION MICRODISCECTOMY Left 12/02/2017   Procedure: Left Lumbar 4-5 disectomy;  Surgeon: Venita Lick, MD;  Location: [facility] OR;  Service: Orthopedics;  Laterality: Left;  2 hrs   NASAL SEPTOPLASTY W/ TURBINOPLASTY Bilateral 10/02/2013   Procedure: BILATERAL NASAL SEPTOPLASTY WITH TURBINATE  REDUCTION;  Surgeon: Serena Colonel, MD;  Location: [facility];  Service: ENT;  Laterality: Bilateral;   SHOULDER ARTHROSCOPY Right 11/2013   VASECTOMY  06/02/2013    FAMILY HISTORY: Family History  Problem Relation Age of Onset   Heart disease Maternal Uncle 52       heart attack   Heart disease Maternal Uncle 79       CVA   Colon cancer Neg Hx    Colon polyps Neg Hx    Liver disease Neg Hx     SOCIAL HISTORY: Social History   Socioeconomic History   Marital status: Married    Spouse name: Not on file   Number of children: Not on file   Years of education: Not on file   Highest education level: Not on file  Occupational History   Not on file  Tobacco Use   Smoking status: Never   Smokeless tobacco: Never  Vaping Use   Vaping Use: Never used  Substance and Sexual Activity   Alcohol use: No    Comment: history of alcohol use in his 9s, none now    Drug use: Yes    Types: Marijuana   Sexual activity: Yes  Other Topics Concern   Not on file  Social History Narrative   Not on file    Social Determinants of Health   Financial Resource Strain: Not on file  Food Insecurity: Not on file  Transportation Needs: Not on file  Physical Activity: Not on file  Stress: Not on file  Social Connections: Not on file  Intimate Partner Violence: Not on file     PHYSICAL EXAM  Limited exam due to visit type  General: well developed, well nourished, pleasant middle-aged Caucasian male, seated, in no evident distress Head: head normocephalic and atraumatic.    Neurologic Exam Mental Status: Awake and fully alert. Oriented to place and time. Recent and remote memory intact. Attention span, concentration and fund of knowledge appropriate. Mood and affect appropriate.  Cranial Nerves: Extraocular movements full without nystagmus. Hearing intact to voice. Facial sensation intact. Face, tongue, palate moves normally and symmetrically.  Shoulder shrug symmetric. Motor: No evidence of weakness per drift assessment Coordination: Rapid alternating movements normal in all extremities. Finger-to-nose and heel-to-shin performed accurately bilaterally.     DIAGNOSTIC DATA (LABS, IMAGING, TESTING) - I reviewed patient records, labs, notes, testing and imaging myself where available.  Lab Results  Component Value Date   WBC 7.8 06/23/2021   HGB 17.6 (H) 06/23/2021   HCT 50.7 (H) 06/23/2021   MCV 86.8 06/23/2021   PLT 269 06/23/2021      Component Value Date/Time   NA 141 06/23/2021 1156   K 4.7 06/23/2021 1156   CL 104 06/23/2021 1156   CO2 30 06/23/2021 1156   GLUCOSE 98 06/23/2021 1156   BUN 16 06/23/2021 1156   CREATININE 1.00 06/23/2021 1156   CALCIUM 9.8 06/23/2021 1156   PROT 7.4 06/23/2021 1156   ALBUMIN 5.0 04/25/2021 1537   AST 32 06/23/2021 1156   ALT 90 (H) 06/23/2021 1156   ALKPHOS 53 04/25/2021 1537   BILITOT 0.5 06/23/2021 1156   GFRNONAA >60 04/25/2021 1537   GFRNONAA 87 08/25/2016 0950   GFRAA >60 11/29/2017 1156   GFRAA >89 08/25/2016 0950   Lab  Results  Component Value Date   CHOL 238 (H) 06/23/2021   HDL 37 (L) 06/23/2021   LDLCALC 164 (H) 06/23/2021   TRIG 207 (H) 06/23/2021  CHOLHDL 6.4 (H) 06/23/2021    Lab Results  Component Value Date   TSH 1.437 02/22/2013      ASSESSMENT AND PLAN  Anothy DONN ZANETTI is a very pleasant 45 y.o.-year old male with an underlying medical history of hypertriglyceridemia, OCD, anxiety, depression, recent status post lumbar laminectomy, status post septoplasty and turbinate reduction, shoulder arthroscopic surgery, and history of obesity, who presents for severe obstructive sleep apnea and CPAP follow-up.  Recent CPAP noncompliance due to difficulty tolerating current mask Order will be placed to DME company to refit patient with a different mask as well as obtain ongoing supplies Requested patient call office 1 month after obtaining new mask for reevaluation of compliance and treatment of sleep apnea Discussed importance of compliance with CPAP He is aware to continue to follow with DME company [employer] for ongoing supplies or CPAP related concerns   Follow-up in 1 year or call earlier if needed   CC: Huston Foley, MD Donita Brooks, MD    I spent 20 minutes of face-to-face via video visit and non-face-to-face time with patient.  This included previsit chart review, lab review, study review, order entry, electronic health record documentation, patient education   Ihor Austin, [facility]  [facility] [address] [place], [place] [address]  Phone [phone] Fax [phone] Note: This document was prepared with digital dictation and possible smart phrase technology. Any transcriptional errors that result from this process are unintentional.  I reviewed the above note and documentation by the Nurse Practitioner and agree with the history, exam, assessment and plan as outlined above. I was available for consultation. Huston Foley, MD,  PhD [facility])

## 2021-07-21 ENCOUNTER — Encounter: Payer: Self-pay | Admitting: Adult Health

## 2021-07-21 ENCOUNTER — Telehealth (INDEPENDENT_AMBULATORY_CARE_PROVIDER_SITE_OTHER): Payer: Self-pay | Admitting: Adult Health

## 2021-07-21 ENCOUNTER — Telehealth: Payer: Self-pay | Admitting: Adult Health

## 2021-07-21 NOTE — Telephone Encounter (Signed)
FYI- pt called states he was waiting for 25 minutes and was not waiting any longer he said to cancel his appt.

## 2021-07-21 NOTE — Progress Notes (Deleted)
[facility]  PATIENT: Robert Mcpherson DOB: Dec 10, 1975   [facility] provider: Dr. Frances Furbish REASON FOR VISIT: OSA on CPAP HISTORY FROM: Patient  Virtual Visit via Video Note  I connected with Robert Mcpherson on 07/21/21 at 11:15 AM EDT by a video enabled telemedicine application located at [facility] and verified that I am speaking with the correct person using two identifiers who was located in their home.   Visit scheduled by Ann Maki, RN who discussed the limitations of evaluation and management by telemedicine and the availability of in person appointments. The patient expressed understanding and agreed to proceed.    HISTORY OF PRESENT ILLNESS:  Update 07/21/2021 JM: Returns for yearly CPAP follow-up via MyChart video visit per pt request.  He has been noncompliant with CPAP due to needing new supplies. Reports he has been out of supplies since end of June.         History provided for reference purposes only Update 04/30/2020 JM: Robert Mcpherson is being seen via virtual visit for CPAP follow-up.  Compliance report from 12/15/2019 -01/13/2020 shows 19 out of 30 usage days with 15 days greater than 4 hours for a 50% compliance.  Average usage 4 hours and 52 minutes.  Residual AHI 0.8 with min pressure 7 and max pressure 15 with a EPR level 1.  Pressure in the 95th percentile 11.5.  Leaks in the 95th percentile 0.9.  He reports having difficulty using prior mask due to nasal discomfort hence the reason for noncompliance He has noticed increased daytime fatigue and difficulty sleeping at night when not using CPAP  UPDATE 3/11/2020CM Robert Mcpherson, 45 year old male returns for follow-up with newly diagnosed severe obstructive sleep apnea here for his initial CPAP.  Patient claims he feels so much better, he is well rested every morning and he is sleeping so much better.  CPAP data dated 12/10/2018-01/08/2019 shows compliance greater than 4 hours at 100%.  Average usage 7  hours 4 minutes.  Set pressure 7 to 15 cm.  EPR level 1 leak 95th percentile 1.4.  AHI 0.7 ESS 4.  He returns for reevaluation.  Initial consult visit 09/06/18 Dr. Frances Furbish: Robert Mcpherson is a 45 year old right-handed gentleman with an underlying medical history of hypertriglyceridemia, OCD, anxiety, depression, recent status post lumbar laminectomy, status post septoplasty and turbinate reduction, shoulder arthroscopic surgery, and history of obesity, who reports snoring and excessive daytime somnolence. I reviewed your office note from 06/10/2018. He had a sleep study 5 years ago which showed mild sleep apnea. I reviewed his PSG report from 09/11/2013, study was interpreted by Dr. Maple Hudson. Of note, his weight was recorded to be 200 tablet at the time, BMI of 31. Overall AHI was 11 per hour. He achieved all stages of sleep with 15.9% of REM sleep, sleep latency was 45 minutes, REM latency 78 minutes. Average oxygen saturation was 93.2%, nadir was 83%. No significant PLMs. His Epworth sleepiness score is 10 out of 24 today, fatigue score is 56 out of 56 (one question was not answered, so total score is out of 56, not 63). Is married and lives with his wife and children. He has a total of 4 children and is self-employed, Radio broadcast assistant. He is a nonsmoker and drinks alcohol occasionally, maybe twice a month or so and drinks caffeine daily in the form of iced coffee about 2 per day, large servings, which helps him stay alert during the day. He reports significant weight gain secondary to back pain. He  has a family history of sleep apnea and has several uncles and cousins on CPAP machines. He would be willing to try CPAP. He did not have much in the way of improvement of his nasal breathing after nasal surgery. He does not keep a very set schedule for sleep. He reports sleeping some during the day and also somewhat night. Typical bedtime is after midnight, typically before 2 AM. Rise time is around 8. He takes a 3-4 hour nap  every day around 1 or 2 PM. He has 4 younger children, ages 53, 48, 62, and for. They are all homeschooled. He denies night to night nocturia but has had morning headaches. He has had mood irritability especially when he is sleep deprived or not sleeping well.  REVIEW OF SYSTEMS: Full 14 system review of systems performed and notable only for those listed, all others are neg:  Constitutional: neg  Cardiovascular: neg Ear/Nose/Throat: neg  Skin: neg Eyes: neg Respiratory: neg Gastroitestinal: neg  Hematology/Lymphatic: neg  Endocrine: neg Musculoskeletal:neg Allergy/Immunology: neg Neurological: neg Psychiatric: neg Sleep : Obstructive sleep apnea with CPAP   ALLERGIES: Allergies  Allergen Reactions   Cephalexin Hives   Other Hives, Itching and Rash    HOME MEDICATIONS: Outpatient Medications Prior to Visit  Medication Sig Dispense Refill   Ascorbic Acid (VITAMIN C) 1000 MG tablet Take 1,000 mg by mouth 3 (three) times daily.     Cholecalciferol ([employer]S VIT D 5000 HIGH-POTENCY PO) Take by mouth. One daily     clonazePAM (KLONOPIN) 1 MG tablet TAKE 1 TABLET(1 MG) BY MOUTH THREE TIMES DAILY AS NEEDED FOR ANXIETY 90 tablet 0   diazepam (VALIUM) 10 MG tablet Take 1 tablet (10 mg total) by mouth every 12 (twelve) hours as needed for anxiety (take 1 hour before plane flight.). 10 tablet 0   Milk Thistle 150 MG CAPS Take by mouth. Takes one daily     ondansetron (ZOFRAN) 4 MG tablet Take 1 tablet (4 mg total) by mouth every 6 (six) hours. 12 tablet 0   oxyCODONE-acetaminophen (PERCOCET) 5-325 MG tablet Take 1 tablet by mouth every 4 (four) hours as needed for severe pain. 15 tablet 0   rosuvastatin (CRESTOR) 20 MG tablet Take 1 tablet (20 mg total) by mouth daily. 90 tablet 3   sertraline (ZOLOFT) 100 MG tablet Take 1 tablet (100 mg total) by mouth daily. 90 tablet 2   tamsulosin (FLOMAX) 0.4 MG CAPS capsule Take 1 capsule (0.4 mg total) by mouth daily. 30 capsule 0   triamcinolone  (KENALOG) 0.1 % Apply 1 application topically 2 (two) times daily. 30 g 1   Turmeric 400 MG CAPS Take by mouth. Takes one daily     No facility-administered medications prior to visit.    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   Nasal turbinate hypertrophy 09/2013   OCD (obsessive compulsive disorder) 04/02/2014   Sleep apnea     PAST SURGICAL HISTORY: Past Surgical History:  Procedure Laterality Date   APPENDECTOMY     LUMBAR LAMINECTOMY/DECOMPRESSION MICRODISCECTOMY Left 12/02/2017   Procedure: Left Lumbar 4-5 disectomy;  Surgeon: Venita Lick, MD;  Location: [facility] OR;  Service: Orthopedics;  Laterality: Left;  2 hrs   NASAL SEPTOPLASTY W/ TURBINOPLASTY Bilateral 10/02/2013   Procedure: BILATERAL NASAL SEPTOPLASTY WITH  TURBINATE REDUCTION;  Surgeon: Serena Colonel, MD;  Location: [facility];  Service: ENT;  Laterality: Bilateral;   SHOULDER ARTHROSCOPY Right 11/2013   VASECTOMY  06/02/2013    FAMILY HISTORY: Family History  Problem Relation Age of Onset   Heart disease Maternal Uncle 52       heart attack   Heart disease Maternal Uncle 69       CVA   Colon cancer Neg Hx    Colon polyps Neg Hx    Liver disease Neg Hx     SOCIAL HISTORY: Social History   Socioeconomic History   Marital status: Married    Spouse name: Not on file   Number of children: Not on file   Years of education: Not on file   Highest education level: Not on file  Occupational History   Not on file  Tobacco Use   Smoking status: Never   Smokeless tobacco: Never  Vaping Use   Vaping Use: Never used  Substance and Sexual Activity   Alcohol use: No    Comment: history of alcohol use in his 17s, none now    Drug use: Yes    Types: Marijuana   Sexual activity: Yes  Other Topics Concern   Not on file  Social History  Narrative   Not on file   Social Determinants of Health   Financial Resource Strain: Not on file  Food Insecurity: Not on file  Transportation Needs: Not on file  Physical Activity: Not on file  Stress: Not on file  Social Connections: Not on file  Intimate Partner Violence: Not on file     PHYSICAL EXAM  Limited exam due to visit type  General: well developed, well nourished, pleasant middle-aged Caucasian male, seated, in no evident distress Head: head normocephalic and atraumatic.    Neurologic Exam Mental Status: Awake and fully alert. Oriented to place and time. Recent and remote memory intact. Attention span, concentration and fund of knowledge appropriate. Mood and affect appropriate.  Cranial Nerves: Extraocular movements full without nystagmus. Hearing intact to voice. Facial sensation intact. Face, tongue, palate moves normally and symmetrically.  Shoulder shrug symmetric. Motor: No evidence of weakness per drift assessment Coordination: Rapid alternating movements normal in all extremities. Finger-to-nose and heel-to-shin performed accurately bilaterally.     DIAGNOSTIC DATA (LABS, IMAGING, TESTING) - I reviewed patient records, labs, notes, testing and imaging myself where available.  Lab Results  Component Value Date   WBC 7.8 06/23/2021   HGB 17.6 (H) 06/23/2021   HCT 50.7 (H) 06/23/2021   MCV 86.8 06/23/2021   PLT 269 06/23/2021      Component Value Date/Time   NA 141 06/23/2021 1156   K 4.7 06/23/2021 1156   CL 104 06/23/2021 1156   CO2 30 06/23/2021 1156   GLUCOSE 98 06/23/2021 1156   BUN 16 06/23/2021 1156   CREATININE 1.00 06/23/2021 1156   CALCIUM 9.8 06/23/2021 1156   PROT 7.4 06/23/2021 1156   ALBUMIN 5.0 04/25/2021 1537   AST 32 06/23/2021 1156   ALT 90 (H) 06/23/2021 1156   ALKPHOS 53 04/25/2021 1537   BILITOT 0.5 06/23/2021 1156   GFRNONAA >60 04/25/2021 1537   GFRNONAA 87 08/25/2016 0950   GFRAA >60 11/29/2017 1156   GFRAA >89  08/25/2016 0950   Lab Results  Component Value Date   CHOL 238 (H) 06/23/2021   HDL 37 (L) 06/23/2021   LDLCALC 164 (H) 06/23/2021   TRIG 207 (H) 06/23/2021  CHOLHDL 6.4 (H) 06/23/2021    Lab Results  Component Value Date   TSH 1.437 02/22/2013      ASSESSMENT AND PLAN  Robert Mcpherson is a very pleasant 45 y.o.-year old male with an underlying medical history of hypertriglyceridemia, OCD, anxiety, depression, recent status post lumbar laminectomy, status post septoplasty and turbinate reduction, shoulder arthroscopic surgery, and history of obesity, who presents for severe obstructive sleep apnea and CPAP follow-up.   Recent CPAP noncompliance due to difficulty tolerating current mask Order will be placed to DME company to refit patient with a different mask as well as obtain ongoing supplies Requested patient call office 1 month after obtaining new mask for reevaluation of compliance and treatment of sleep apnea Discussed importance of compliance with CPAP He is aware to continue to follow with DME company [employer] for ongoing supplies or CPAP related concerns   Follow-up in 1 year or call earlier if needed   CC: [facility] provider: Huston Foley, MD PCP: Donita Brooks, MD    I spent 20 minutes of face-to-face and non-face-to-face time with patient via MyChart video visit.  This included previsit chart review, lab review, study review, order entry, electronic health record documentation, patient education and discussion regarding CPAP compliance and review of compliance report, importance of nightly use and adequate compliance and answered all other questions to patient's satisfaction   Ihor Austin, AGNP-BC  [facility] [address] [place], [place] [address]  Phone [phone] Fax [phone] Note: This document was prepared with digital dictation and possible smart phrase technology. Any transcriptional errors that result from this  process are unintentional.  I reviewed the above note and documentation by the Nurse Practitioner and agree with the history, exam, assessment and plan as outlined above. I was available for consultation. Huston Foley, MD, PhD [facility])

## 2021-07-21 NOTE — Progress Notes (Signed)
Erroneous encounter -scheduled for MyChart video visit - when provider logged on, unable to see or hear patient. Attempted to call patient by listed cell phone number but went to voice mail therefore attempted to call his wife, Robert Mcpherson, through listed cell phone number but wrong number listed.  Patient will need to call office to schedule follow-up visit.

## 2021-07-22 ENCOUNTER — Ambulatory Visit (INDEPENDENT_AMBULATORY_CARE_PROVIDER_SITE_OTHER): Payer: 59 | Admitting: Family Medicine

## 2021-07-22 ENCOUNTER — Other Ambulatory Visit: Payer: Self-pay

## 2021-07-22 ENCOUNTER — Encounter: Payer: Self-pay | Admitting: Family Medicine

## 2021-07-22 VITALS — BP 148/96 | HR 90 | Temp 98.8°F | Resp 16 | Ht 69.0 in | Wt 246.0 lb

## 2021-07-22 DIAGNOSIS — E7849 Other hyperlipidemia: Secondary | ICD-10-CM

## 2021-07-22 DIAGNOSIS — F429 Obsessive-compulsive disorder, unspecified: Secondary | ICD-10-CM

## 2021-07-22 DIAGNOSIS — G4730 Sleep apnea, unspecified: Secondary | ICD-10-CM | POA: Diagnosis not present

## 2021-07-22 DIAGNOSIS — Z0000 Encounter for general adult medical examination without abnormal findings: Secondary | ICD-10-CM | POA: Diagnosis not present

## 2021-07-22 DIAGNOSIS — R748 Abnormal levels of other serum enzymes: Secondary | ICD-10-CM

## 2021-07-22 DIAGNOSIS — F419 Anxiety disorder, unspecified: Secondary | ICD-10-CM | POA: Diagnosis not present

## 2021-07-22 DIAGNOSIS — Z9989 Dependence on other enabling machines and devices: Secondary | ICD-10-CM

## 2021-07-22 DIAGNOSIS — G4733 Obstructive sleep apnea (adult) (pediatric): Secondary | ICD-10-CM

## 2021-07-22 MED ORDER — EPINEPHRINE 0.3 MG/0.3ML IJ SOAJ: 0.3000 mg | INTRAMUSCULAR | 1 refills | Status: AC | PRN

## 2021-07-22 MED ORDER — PRAZOSIN HCL 2 MG PO CAPS: 2.0000 mg | ORAL_CAPSULE | Freq: Every day | ORAL | 4 refills | Status: DC

## 2021-07-22 MED ORDER — DIAZEPAM 10 MG PO TABS: 10.0000 mg | ORAL_TABLET | Freq: Two times a day (BID) | ORAL | 0 refills | Status: DC | PRN
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
## 2021-07-22 NOTE — Addendum Note (Signed)
Addended by: Phillips Odor on: 07/22/2021 11:15 AM   Modules accepted: Orders

## 2021-09-05 ENCOUNTER — Other Ambulatory Visit: Payer: Self-pay

## 2021-09-05 MED ORDER — CLONAZEPAM 1 MG PO TABS: 1.0000 mg | ORAL_TABLET | Freq: Three times a day (TID) | ORAL | 0 refills | Status: DC | PRN

## 2021-09-05 NOTE — Telephone Encounter (Signed)
Pt called in stating that he needs a refill of clonazePAM (KLONOPIN) 1 MG. Pt states that he is in [place] and would like for the refill to be sent to [facility] in [place], [place]. Please advise.  Please call if this is possible [phone]

## 2021-09-05 NOTE — Telephone Encounter (Signed)
Call placed to patient.   Verified pharmacy as: [facility]  [address], [place] [address]  Ok to refill??  Last office visit 07/22/2021.  Last refill 06/26/2021.

## 2021-09-12 ENCOUNTER — Telehealth: Payer: Self-pay | Admitting: Neurology

## 2021-09-12 NOTE — Telephone Encounter (Signed)
Pt is asking if he will need an appointment to be seen before getting a prescription for a new mask thru DME, please call.

## 2021-10-06 NOTE — Telephone Encounter (Signed)
Pt has been called and VM has been left for him to call office back.

## 2021-10-08 ENCOUNTER — Ambulatory Visit (INDEPENDENT_AMBULATORY_CARE_PROVIDER_SITE_OTHER): Payer: 59 | Admitting: Adult Health

## 2021-10-08 ENCOUNTER — Encounter: Payer: Self-pay | Admitting: Adult Health

## 2021-10-08 VITALS — BP 179/120 | HR 96 | Ht 69.0 in | Wt 263.0 lb

## 2021-10-08 DIAGNOSIS — G4733 Obstructive sleep apnea (adult) (pediatric): Secondary | ICD-10-CM

## 2021-10-08 DIAGNOSIS — Z9989 Dependence on other enabling machines and devices: Secondary | ICD-10-CM

## 2021-10-08 NOTE — Progress Notes (Signed)
[facility]  PATIENT: Robert Mcpherson DOB: 29-Sep-1976   [facility] provider: Dr. Frances Furbish REASON FOR VISIT: OSA on CPAP HISTORY FROM: Patient    HISTORY OF PRESENT ILLNESS:  Update 10/08/2021 JM: Returns for yearly CPAP follow-up.  He has not been using his CPAP over the past several months due to difficulty tolerating facemask and running out of supplies and of June.  At prior visit, order placed to be refitted with a different mask due to difficulty tolerating nasal pillows with history of deviated septum repair 6 to 7 years ago and right nostril pretty much closed.  He apparently was never contacted to be refitted by [facility].  He does wish to restart therapy once he has more appropriate mask.  No further concerns at this time    History provided for reference purposes only Update 04/30/2020 JM: Robert Mcpherson is being seen via virtual visit for CPAP follow-up.  Compliance report from 12/15/2019 -01/13/2020 shows 19 out of 30 usage days with 15 days greater than 4 hours for a 50% compliance.  Average usage 4 hours and 52 minutes.  Residual AHI 0.8 with min pressure 7 and max pressure 15 with a EPR level 1.  Pressure in the 95th percentile 11.5.  Leaks in the 95th percentile 0.9.  He reports having difficulty using prior mask due to nasal discomfort hence the reason for noncompliance He has noticed increased daytime fatigue and difficulty sleeping at night when not using CPAP  UPDATE 3/11/2020CM Robert Mcpherson, 45 year old male returns for follow-up with newly diagnosed severe obstructive sleep apnea here for his initial CPAP.  Patient claims he feels so much better, he is well rested every morning and he is sleeping so much better.  CPAP data dated 12/10/2018-01/08/2019 shows compliance greater than 4 hours at 100%.  Average usage 7 hours 4 minutes.  Set pressure 7 to 15 cm.  EPR level 1 leak 95th percentile 1.4.  AHI 0.7 ESS 4.  He returns for reevaluation.  Initial consult visit 09/06/18 Dr.  Frances Furbish: Robert Mcpherson is a 45 year old right-handed gentleman with an underlying medical history of hypertriglyceridemia, OCD, anxiety, depression, recent status post lumbar laminectomy, status post septoplasty and turbinate reduction, shoulder arthroscopic surgery, and history of obesity, who reports snoring and excessive daytime somnolence. I reviewed your office note from 06/10/2018. He had a sleep study 5 years ago which showed mild sleep apnea. I reviewed his PSG report from 09/11/2013, study was interpreted by Dr. Maple Hudson. Of note, his weight was recorded to be 200 tablet at the time, BMI of 31. Overall AHI was 11 per hour. He achieved all stages of sleep with 15.9% of REM sleep, sleep latency was 45 minutes, REM latency 78 minutes. Average oxygen saturation was 93.2%, nadir was 83%. No significant PLMs. His Epworth sleepiness score is 10 out of 24 today, fatigue score is 56 out of 56 (one question was not answered, so total score is out of 56, not 63). Is married and lives with his wife and children. He has a total of 4 children and is self-employed, Radio broadcast assistant. He is a nonsmoker and drinks alcohol occasionally, maybe twice a month or so and drinks caffeine daily in the form of iced coffee about 2 per day, large servings, which helps him stay alert during the day. He reports significant weight gain secondary to back pain. He has a family history of sleep apnea and has several uncles and cousins on CPAP machines. He would be willing to try CPAP.  He did not have much in the way of improvement of his nasal breathing after nasal surgery. He does not keep a very set schedule for sleep. He reports sleeping some during the day and also somewhat night. Typical bedtime is after midnight, typically before 2 AM. Rise time is around 8. He takes a 3-4 hour nap every day around 1 or 2 PM. He has 4 younger children, ages 80, 55, 33, and for. They are all homeschooled. He denies night to night nocturia but has had morning  headaches. He has had mood irritability especially when he is sleep deprived or not sleeping well.  REVIEW OF SYSTEMS: Full 14 system review of systems performed and notable only for those listed, all others are neg:  Constitutional: neg  Cardiovascular: neg Ear/Nose/Throat: neg  Skin: neg Eyes: neg Respiratory: neg Gastroitestinal: neg  Hematology/Lymphatic: neg  Endocrine: neg Musculoskeletal:neg Allergy/Immunology: neg Neurological: neg Psychiatric: neg Sleep : Obstructive sleep apnea with CPAP   ALLERGIES: Allergies  Allergen Reactions   Cephalexin Hives    HOME MEDICATIONS: Outpatient Medications Prior to Visit  Medication Sig Dispense Refill   clonazePAM (KLONOPIN) 1 MG tablet Take 1 tablet (1 mg total) by mouth 3 (three) times daily as needed for anxiety. 90 tablet 0   EPINEPHrine 0.3 mg/0.3 mL IJ SOAJ injection Inject 0.3 mg into the muscle as needed for anaphylaxis. 1 each 1   Multiple Vitamins-Minerals (IMMUNE SUPPORT PO) Take by mouth.     prazosin (MINIPRESS) 2 MG capsule Take 1 capsule (2 mg total) by mouth at bedtime. 30 capsule 4   sertraline (ZOLOFT) 100 MG tablet Take 1 tablet (100 mg total) by mouth daily. 90 tablet 2   Turmeric, Curcuma Longa, (CURCUMIN) POWD by Does not apply route.     diazepam (VALIUM) 10 MG tablet Take 1 tablet (10 mg total) by mouth every 12 (twelve) hours as needed for anxiety. TAKE (1) TAB PO 1HOUR PRIOR TO FLIGHTS. 10 tablet 0   OVER THE COUNTER MEDICATION Barley Grass     rosuvastatin (CRESTOR) 20 MG tablet Take 1 tablet (20 mg total) by mouth daily. 90 tablet 3   No facility-administered medications prior to visit.    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   Nasal turbinate hypertrophy 09/2013   OCD (obsessive compulsive disorder) 04/02/2014   Sleep apnea     PAST  SURGICAL HISTORY: Past Surgical History:  Procedure Laterality Date   APPENDECTOMY     LUMBAR LAMINECTOMY/DECOMPRESSION MICRODISCECTOMY Left 12/02/2017   Procedure: Left Lumbar 4-5 disectomy;  Surgeon: Venita Lick, MD;  Location: [facility] OR;  Service: Orthopedics;  Laterality: Left;  2 hrs   NASAL SEPTOPLASTY W/ TURBINOPLASTY Bilateral 10/02/2013   Procedure: BILATERAL NASAL SEPTOPLASTY WITH TURBINATE REDUCTION;  Surgeon: Serena Colonel, MD;  Location: [facility];  Service: ENT;  Laterality: Bilateral;   SHOULDER ARTHROSCOPY Right 11/2013   VASECTOMY  06/02/2013    FAMILY HISTORY: Family History  Problem Relation Age of Onset   Heart disease Maternal Uncle 52       heart attack   Heart disease Maternal Uncle 60       CVA   Colon cancer Neg Hx    Colon polyps Neg Hx    Liver disease Neg Hx  SOCIAL HISTORY: Social History   Socioeconomic History   Marital status: Married    Spouse name: Not on file   Number of children: Not on file   Years of education: Not on file   Highest education level: Not on file  Occupational History   Not on file  Tobacco Use   Smoking status: Never   Smokeless tobacco: Never  Vaping Use   Vaping Use: Never used  Substance and Sexual Activity   Alcohol use: No    Comment: history of alcohol use in his 36s, none now    Drug use: Yes    Types: Marijuana   Sexual activity: Yes  Other Topics Concern   Not on file  Social History Narrative   Not on file   Social Determinants of Health   Financial Resource Strain: Not on file  Food Insecurity: Not on file  Transportation Needs: Not on file  Physical Activity: Not on file  Stress: Not on file  Social Connections: Not on file  Intimate Partner Violence: Not on file     PHYSICAL EXAM General: well developed, well nourished, very pleasant middle-age Caucasian male, seated, in no evident distress Cardiovascular: regular rate and rhythm, no murmurs  Neurologic Exam Mental  Status: Awake and fully alert.  Fluent speech and language.  Oriented to place and time. Recent and remote memory intact. Attention span, concentration and fund of knowledge appropriate. Mood and affect appropriate.  Cranial Nerves: Pupils equal, briskly reactive to light. Extraocular movements full without nystagmus. Visual fields full to confrontation. Hearing intact. Facial sensation intact. Face, tongue, palate moves normally and symmetrically.  Motor: Normal bulk and tone. Normal strength in all tested extremity muscles Sensory.: intact to touch , pinprick , position and vibratory sensation.  Coordination: Rapid alternating movements normal in all extremities. Finger-to-nose and heel-to-shin performed accurately bilaterally. Gait and Station: Arises from chair without difficulty. Stance is normal. Gait demonstrates normal stride length and balance out use of AD. Tandem walk and heel toe without difficulty.  Reflexes: 1+ and symmetric. Toes downgoing.       DIAGNOSTIC DATA (LABS, IMAGING, TESTING) - I reviewed patient records, labs, notes, testing and imaging myself where available.  Lab Results  Component Value Date   WBC 7.8 06/23/2021   HGB 17.6 (H) 06/23/2021   HCT 50.7 (H) 06/23/2021   MCV 86.8 06/23/2021   PLT 269 06/23/2021      Component Value Date/Time   NA 141 06/23/2021 1156   K 4.7 06/23/2021 1156   CL 104 06/23/2021 1156   CO2 30 06/23/2021 1156   GLUCOSE 98 06/23/2021 1156   BUN 16 06/23/2021 1156   CREATININE 1.00 06/23/2021 1156   CALCIUM 9.8 06/23/2021 1156   PROT 7.4 06/23/2021 1156   ALBUMIN 5.0 04/25/2021 1537   AST 32 06/23/2021 1156   ALT 90 (H) 06/23/2021 1156   ALKPHOS 53 04/25/2021 1537   BILITOT 0.5 06/23/2021 1156   GFRNONAA >60 04/25/2021 1537   GFRNONAA 87 08/25/2016 0950   GFRAA >60 11/29/2017 1156   GFRAA >89 08/25/2016 0950   Lab Results  Component Value Date   CHOL 238 (H) 06/23/2021   HDL 37 (L) 06/23/2021   LDLCALC 164 (H)  06/23/2021   TRIG 207 (H) 06/23/2021   CHOLHDL 6.4 (H) 06/23/2021    Lab Results  Component Value Date   TSH 1.437 02/22/2013      ASSESSMENT AND PLAN  Robert Mcpherson is a very pleasant 45 y.o.-year  old male with an underlying medical history of hypertriglyceridemia, OCD, anxiety, depression, recent status post lumbar laminectomy, status post septoplasty and turbinate reduction, shoulder arthroscopic surgery, and history of obesity, who presents for severe obstructive sleep apnea and CPAP follow-up.  CPAP noncompliance due to difficulty tolerating current mask - AGAIN will place order to DME company requesting changing of mask as well as receiving ongoing supplies. He was advised to call office after 1 to 2 weeks if he does not receive a phone call Discussed importance of compliance with CPAP He is aware to continue to follow with DME company [facility] for ongoing supplies or CPAP related concerns   Follow-up in 1 year or call earlier if needed   CC: Donita Brooks, MD    I spent 20 minutes of face-to-face via video visit and non-face-to-face time with patient.  This included previsit chart review, lab review, study review, order entry, electronic health record documentation, patient education and discussion regarding history of CPAP and CPAP noncompliance due to difficulty tolerating current mask, importance of nightly CPAP use and answered all other questions to patient satisfaction   Ihor Austin, AGNP-BC  [facility] [address] [place], [place] [address]  Phone [phone] Fax [phone] Note: This document was prepared with digital dictation and possible smart phrase technology. Any transcriptional errors that result from this process are unintentional.  I reviewed the above note and documentation by the Nurse Practitioner and agree with the history, exam, assessment and plan as outlined above. I was available for  consultation. Huston Foley, MD, PhD [facility])

## 2021-10-08 NOTE — Patient Instructions (Signed)
You should be contacted by [facility] for mask refitting - if you do not hear from them by end of next week, please let me know  Any other difficulties in the mean time, let us know!     Followup in the future with me in 1 year or call earlier if needed       Thank you for coming to see [place] at [facility]. I hope we have been able to provide you high quality care today.  You may receive a patient satisfaction survey over the next few weeks. We would appreciate your feedback and comments so that we may continue to improve ourselves and the health of our patients.

## 2021-10-09 ENCOUNTER — Encounter: Payer: Self-pay | Admitting: Adult Health

## 2021-10-09 ENCOUNTER — Telehealth: Payer: Self-pay | Admitting: Adult Health
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
## 2021-10-09 NOTE — Progress Notes (Signed)
Order for mask refit has been sent to [facility].

## 2021-10-23 ENCOUNTER — Other Ambulatory Visit: Payer: Self-pay | Admitting: Family Medicine

## 2021-10-23 ENCOUNTER — Encounter: Payer: Self-pay | Admitting: Family Medicine

## 2021-10-23 MED ORDER — CLONAZEPAM 1 MG PO TABS: 1.0000 mg | ORAL_TABLET | Freq: Three times a day (TID) | ORAL | 0 refills | Status: DC | PRN

## 2021-10-23 NOTE — Telephone Encounter (Signed)
LOV 07/22/21 Last refill 09/05/21, #90, 0 refills  Please review, thanks!

## 2021-11-04 ENCOUNTER — Ambulatory Visit: Payer: 59 | Admitting: Family Medicine

## 2021-11-28 ENCOUNTER — Other Ambulatory Visit: Payer: Self-pay | Admitting: Family Medicine

## 2021-11-28 MED ORDER — CLONAZEPAM 1 MG PO TABS: 1.0000 mg | ORAL_TABLET | Freq: Three times a day (TID) | ORAL | 0 refills | Status: DC | PRN

## 2021-11-28 NOTE — Telephone Encounter (Signed)
Clonazepam refill request, last seen 07/22/2021, last filled 10/23/2021.

## 2021-12-08 ENCOUNTER — Other Ambulatory Visit: Payer: Self-pay | Admitting: Family Medicine

## 2021-12-09 ENCOUNTER — Encounter: Payer: Self-pay | Admitting: Nurse Practitioner

## 2021-12-09 ENCOUNTER — Other Ambulatory Visit: Payer: Self-pay

## 2021-12-09 ENCOUNTER — Ambulatory Visit (INDEPENDENT_AMBULATORY_CARE_PROVIDER_SITE_OTHER): Payer: Self-pay | Admitting: Nurse Practitioner

## 2021-12-09 VITALS — BP 158/98 | HR 100 | Ht 69.0 in | Wt 263.0 lb

## 2021-12-09 DIAGNOSIS — F411 Generalized anxiety disorder: Secondary | ICD-10-CM

## 2021-12-09 DIAGNOSIS — F429 Obsessive-compulsive disorder, unspecified: Secondary | ICD-10-CM

## 2021-12-09 DIAGNOSIS — J069 Acute upper respiratory infection, unspecified: Secondary | ICD-10-CM

## 2021-12-09 MED ORDER — CITALOPRAM HYDROBROMIDE 10 MG PO TABS: 10.0000 mg | ORAL_TABLET | Freq: Every day | ORAL | 0 refills | Status: DC

## 2021-12-09 MED ORDER — GUAIFENESIN ER 600 MG PO TB12: 600.0000 mg | ORAL_TABLET | Freq: Two times a day (BID) | ORAL | 0 refills | Status: DC | PRN

## 2021-12-09 MED ORDER — BENZONATATE 200 MG PO CAPS: 200.0000 mg | ORAL_CAPSULE | Freq: Two times a day (BID) | ORAL | 0 refills | Status: DC | PRN

## 2021-12-09 NOTE — Progress Notes (Signed)
Subjective:    Patient ID: Robert Mcpherson, male    DOB: 1976-02-01, 46 y.o.   MRN: [id number]  HPI: Robert Mcpherson is a 46 y.o. male presenting for bronchitis.    Chief Complaint  Patient presents with   Cough   UPPER RESPIRATORY TRACT INFECTION Onset: 5 days COVID-19 testing history: COVID-19 vaccination status: Fever: no Body aches: no Chills:  Cough: yes; thick green mucus Shortness of breath: no Wheezing: yes; laying down at night Chest pain: yes, with cough Chest tightness: no Chest congestion: yes Nasal congestion: yes Runny nose: no Post nasal drip: yes Sneezing: yes Sore throat: started with, better now  Swollen glands: no Sinus pressure: no Headache: no Face pain: no Toothache: yes first day, better now Ear pain: no  Ear pressure: no  Eyes red/itching:no Eye drainage/crusting: no  Nausea: no  Vomiting: no Diarrhea: no  Change in appetite:  hit and miss   Loss of taste/smell: no  Rash: no Fatigue: yes Sick contacts:  yes; son was sick first Strep contacts: no  Context: worse Recurrent sinusitis: no Treatments attempted: nothing Relief with OTC medications: no  Patient asks at the end of his visit about Zoloft.  He reports he feels like it is no longer giving him any benefit.  He thinks he has been on this medication for many years.  He reports lately, he has been struggling to get out of bed and has been struggling with motivation.  He is also worried about something he read online saying Zoloft makes people at increased risk for infection. He is interested in starting Celexa.  Allergies  Allergen Reactions   Cephalexin Hives    Outpatient Encounter Medications as of 12/09/2021  Medication Sig   benzonatate (TESSALON) 200 MG capsule Take 1 capsule (200 mg total) by mouth 2 (two) times daily as needed for cough.   citalopram (CELEXA) 10 MG tablet Take 1 tablet (10 mg total) by mouth daily. Take 10 mg daily for 7 days, then increase to 20 mg daily  thereafter as long as tolerating well.   clonazePAM (KLONOPIN) 1 MG tablet Take 1 tablet (1 mg total) by mouth 3 (three) times daily as needed for anxiety.   EPINEPHrine 0.3 mg/0.3 mL IJ SOAJ injection Inject 0.3 mg into the muscle as needed for anaphylaxis.   Ferrous Sulfate (IRON PO) Take by mouth.   guaiFENesin (MUCINEX) 600 MG 12 hr tablet Take 1 tablet (600 mg total) by mouth 2 (two) times daily as needed for cough or to loosen phlegm.   MAGNESIUM PO Take by mouth.   Multiple Vitamins-Minerals (IMMUNE SUPPORT PO) Take by mouth.   OVER THE COUNTER MEDICATION Barley   prazosin (MINIPRESS) 2 MG capsule Take 1 capsule (2 mg total) by mouth at bedtime.   Turmeric, Curcuma Longa, (CURCUMIN) POWD by Does not apply route.   [DISCONTINUED] sertraline (ZOLOFT) 100 MG tablet TAKE 1 TABLET(100 MG) BY MOUTH DAILY   No facility-administered encounter medications on file as of 12/09/2021.    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 Interim medical history since our last visit reviewed.  Review of Systems Per HPI unless specifically indicated above     Objective:    BP (!) 158/98    Pulse 100    Ht 5\' 9"  (1.753 m)    Wt 263 lb (119.3 kg)    SpO2 96%    BMI 38.84 kg/m   Wt  Readings from Last 3 Encounters:  12/09/21 263 lb (119.3 kg)  10/08/21 263 lb (119.3 kg)  07/22/21 246 lb (111.6 kg)    Physical Exam Vitals and nursing note reviewed.  Constitutional:      General: He is not in acute distress.    Appearance: Normal appearance. He is obese. He is not toxic-appearing.  HENT:     Head: Normocephalic and atraumatic.     Right Ear: Tympanic membrane, ear canal and external ear normal.     Left Ear: Tympanic membrane, ear canal and external ear normal.     Nose: Congestion present. No rhinorrhea.     Right Sinus: No maxillary sinus tenderness or frontal sinus tenderness.     Left Sinus: No maxillary sinus tenderness or frontal sinus tenderness.     Mouth/Throat:     Mouth: Mucous membranes are moist.     Pharynx: Oropharynx is clear. No oropharyngeal exudate.     Tonsils: No tonsillar exudate or tonsillar abscesses.  Eyes:     General: No scleral icterus.    Extraocular Movements: Extraocular movements intact.  Cardiovascular:     Rate and Rhythm: Normal rate and regular rhythm.     Heart sounds: Normal heart sounds. No murmur heard. Pulmonary:     Effort: Pulmonary effort is normal. No respiratory distress.     Breath sounds: Normal breath sounds. No wheezing, rhonchi or rales.  Skin:    General: Skin is warm and dry.     Coloration: Skin is not jaundiced or pale.     Findings: No erythema.  Neurological:     Mental Status: He is alert and oriented to person, place, and time.     Motor: No weakness.     Gait: Gait normal.  Psychiatric:        Mood and Affect: Mood normal.        Behavior: Behavior normal.        Thought Content: Thought content normal.        Judgment: Judgment normal.      Assessment & Plan:  1. Viral upper respiratory tract infection Acute.  Reassured patient that symptoms and exam findings are most consistent with a viral upper respiratory infection and explained lack of efficacy of antibiotics against viruses.   Discussed expected course and features suggestive of secondary bacterial infection.  Continue supportive care. Increase fluid intake with water or electrolyte solution like pedialyte. Encouraged acetaminophen as needed for fever/pain. Encouraged salt water gargling, chloraseptic spray and throat lozenges. Encouraged OTC guaifenesin. Encouraged saline sinus flushes and/or neti with humidified air.  Can start Tessalon perles for cough at night time.  Follow up with no improvement in symptoms within 7-10 days or if symptoms worsen.  - guaiFENesin (MUCINEX) 600 MG 12 hr tablet; Take  1 tablet (600 mg total) by mouth 2 (two) times daily as needed for cough or to loosen phlegm.  Dispense: 30 tablet; Refill: 0 - benzonatate (TESSALON) 200 MG capsule; Take 1 capsule (200 mg total) by mouth 2 (two) times daily as needed for cough.  Dispense: 20 capsule; Refill: 0  2. Obsessive-compulsive disorder, unspecified type Chronic . PHQ-9 and GAD-7 not obtained today.  Denies SI/HI.  Patient is interested in switching from Zoloft to Celexa and I think this is reasonable.  Start decreasing Zoloft by 1/2 for the next 7 days.  Then stop Zoloft after 7 days and start Celexa 10 mg daily.  After 7 days, if tolerating well, can increase Celexa to 20 mg daily.  I encouraged patient to follow up with his PCP in 6 weeks to reassess how Celexa is going.  We also discussed Effexor and Cymbalta.     - citalopram (CELEXA) 10 MG tablet; Take 1 tablet (10 mg total) by mouth daily. Take 10 mg daily for 7 days, then increase to 20 mg daily thereafter as long as tolerating well.  Dispense: 90 tablet; Refill: 0  3. Generalized anxiety disorder Chronic . PHQ-9 and GAD-7 not obtained today.  Denies SI/HI. Patient is interested in switching from Zoloft to Celexa and I think this is reasonable.  Start decreasing Zoloft by 1/2 for the next 7 days.  Then stop Zoloft after 7 days and start Celexa 10 mg daily.  After 7 days, if tolerating well, can  increase Celexa to 20 mg daily.  I encouraged patient to follow up with his PCP in 6 weeks to reassess how Celexa is going.  We also discussed Effexor and Cymbalta.   - citalopram (CELEXA) 10 MG tablet; Take 1 tablet (10 mg total) by mouth daily. Take 10 mg daily for 7 days, then increase to 20 mg daily thereafter as long as tolerating well.  Dispense: 90 tablet; Refill: 0   Patient's blood pressure is also elevated today - this is likely multifactorial including noncompliance with sleep apnea treatment, noncompliance with hypertension regimen (he is not taking prazosin, and current upper respiratory illness.  I encouraged him to take Prazosin as prescribed, try to use CPAP as much as possible, and follow up on BP with PCP in 6 weeks.  Follow up plan: Return in about 6 weeks (around 01/20/2022) for with PCP for blood pressure, mood follow up.

## 2021-12-09 NOTE — Patient Instructions (Signed)
Decrease Zoloft to 50 mg for 7 days, then start Celexa 10 mg for 7 days.  If tolerating okay, increase Celexa to 20 mg daily and follow up with Dr. Tanya Nones in 6 weeks.

## 2021-12-10 NOTE — Progress Notes (Signed)
Key: [facility] [place] Case ID: [id number] - Rx #: [id number] Need help? Call us at [phone] Status Sent to [facility] Citalopram Hydrobromide 10MG  tablets Form Prior Authorization Form [phone] NCPDP) Original Claim Info 9G

## 2021-12-12 ENCOUNTER — Other Ambulatory Visit: Payer: Self-pay | Admitting: Family Medicine

## 2021-12-12 MED ORDER — HYDROCODONE BIT-HOMATROP MBR 5-1.5 MG/5ML PO SOLN: 5.0000 mL | Freq: Three times a day (TID) | ORAL | 0 refills | Status: DC | PRN

## 2021-12-24 ENCOUNTER — Other Ambulatory Visit: Payer: Self-pay | Admitting: Family Medicine

## 2021-12-25 ENCOUNTER — Other Ambulatory Visit: Payer: Self-pay

## 2021-12-25 MED ORDER — CLONAZEPAM 1 MG PO TABS: 1.0000 mg | ORAL_TABLET | Freq: Three times a day (TID) | ORAL | 0 refills | Status: DC | PRN

## 2021-12-25 NOTE — Telephone Encounter (Signed)
LOV 12/09/21 Last refill 11/28/21, #90, 0 refills  Please review, thanks!

## 2022-01-12 ENCOUNTER — Other Ambulatory Visit: Payer: Self-pay | Admitting: Family Medicine

## 2022-01-12 ENCOUNTER — Encounter: Payer: Self-pay | Admitting: Family Medicine

## 2022-01-12 ENCOUNTER — Telehealth: Payer: Self-pay

## 2022-01-12 MED ORDER — CITALOPRAM HYDROBROMIDE 20 MG PO TABS: 20.0000 mg | ORAL_TABLET | Freq: Every day | ORAL | 3 refills | Status: DC

## 2022-01-12 MED ORDER — CLONAZEPAM 1 MG PO TABS: 1.0000 mg | ORAL_TABLET | Freq: Three times a day (TID) | ORAL | 0 refills | Status: DC | PRN

## 2022-01-12 NOTE — Telephone Encounter (Signed)
LOV 12/09/21 ?Last refill 12/25/21, #90, 0 refills ? ?Please review, thanks! ?

## 2022-01-12 NOTE — Telephone Encounter (Signed)
Pharmacy faxed refill request for  ? ?citalopram (CELEXA) 10 MG tablet [id number]  DISCONTINUED ?  Order Details ?Dose: 10 mg Route: Oral Frequency: Daily  ?Dispense Quantity: 90 tablet Refills: 0   ?     ?Sig: Take 1 tablet (10 mg total) by mouth daily. Take 10 mg daily for 7 days, then increase to 20 mg daily thereafter as long as tolerating well.  ?     ?Start Date: 12/09/21 End Date: 12/25/21  ?Discontinued by: Pricilla Handler, CMA on 12/25/2021 10:44  ?     ?Written Date: 12/09/21 Expiration Date: 12/09/22  ? ?

## 2022-01-12 NOTE — Telephone Encounter (Signed)
Rx updated and sent to pharmacy 

## 2022-01-20 ENCOUNTER — Ambulatory Visit: Payer: Self-pay | Admitting: Family Medicine

## 2022-02-27 MED ORDER — CITALOPRAM HYDROBROMIDE 20 MG PO TABS: 20.0000 mg | ORAL_TABLET | Freq: Every day | ORAL | 3 refills | Status: DC

## 2022-02-27 NOTE — Telephone Encounter (Signed)
LOV 07/22/21 ?Last refill 01/12/22, #90, 0 refills ? ?Please review, thanks! ? ?

## 2022-03-02 MED ORDER — CLONAZEPAM 1 MG PO TABS: 1.0000 mg | ORAL_TABLET | Freq: Three times a day (TID) | ORAL | 0 refills | Status: DC | PRN

## 2022-03-17 ENCOUNTER — Ambulatory Visit: Payer: Self-pay | Admitting: Nurse Practitioner

## 2022-04-13 ENCOUNTER — Other Ambulatory Visit: Payer: Self-pay | Admitting: Family Medicine

## 2022-04-13 MED ORDER — CLONAZEPAM 1 MG PO TABS: 1.0000 mg | ORAL_TABLET | Freq: Three times a day (TID) | ORAL | 0 refills | Status: DC | PRN

## 2022-05-01 ENCOUNTER — Encounter: Payer: Self-pay | Admitting: Family Medicine

## 2022-05-26 ENCOUNTER — Other Ambulatory Visit: Payer: Self-pay | Admitting: Family Medicine

## 2022-05-26 MED ORDER — CLONAZEPAM 1 MG PO TABS: 1.0000 mg | ORAL_TABLET | Freq: Three times a day (TID) | ORAL | 0 refills | Status: DC | PRN

## 2022-05-26 NOTE — Telephone Encounter (Signed)
Pt called asking why this medication was denied?  Pls advice?

## 2022-05-26 NOTE — Telephone Encounter (Signed)
LOV 07/22/21 Last refill 04/13/22, #90, 0 refills  Please review, thanks!

## 2022-07-08 ENCOUNTER — Other Ambulatory Visit: Payer: Self-pay | Admitting: Family Medicine

## 2022-07-10 ENCOUNTER — Ambulatory Visit: Payer: Self-pay | Admitting: Family Medicine

## 2022-07-10 MED ORDER — CLONAZEPAM 1 MG PO TABS: 1.0000 mg | ORAL_TABLET | Freq: Three times a day (TID) | ORAL | 0 refills | Status: DC | PRN

## 2022-07-14 ENCOUNTER — Ambulatory Visit: Payer: Self-pay | Admitting: Family Medicine

## 2022-07-17 ENCOUNTER — Ambulatory Visit: Payer: Self-pay | Admitting: Family Medicine

## 2022-07-21 ENCOUNTER — Ambulatory Visit: Payer: Self-pay | Admitting: Family Medicine

## 2022-08-26 ENCOUNTER — Encounter: Payer: Self-pay | Admitting: Family Medicine

## 2022-08-27 ENCOUNTER — Other Ambulatory Visit: Payer: Self-pay | Admitting: Family Medicine

## 2022-08-27 MED ORDER — CLONAZEPAM 1 MG PO TABS: 1.0000 mg | ORAL_TABLET | Freq: Three times a day (TID) | ORAL | 0 refills | Status: DC | PRN

## 2022-09-07 ENCOUNTER — Ambulatory Visit: Payer: Medicaid Other | Admitting: Family Medicine

## 2022-09-07 VITALS — BP 138/86 | HR 83 | Ht 69.0 in | Wt 251.0 lb

## 2022-09-07 DIAGNOSIS — R634 Abnormal weight loss: Secondary | ICD-10-CM | POA: Diagnosis not present

## 2022-09-07 DIAGNOSIS — F331 Major depressive disorder, recurrent, moderate: Secondary | ICD-10-CM

## 2022-09-07 MED ORDER — VENLAFAXINE HCL ER 75 MG PO CP24: 150.0000 mg | ORAL_CAPSULE | Freq: Every day | ORAL | 3 refills | Status: DC

## 2022-09-07 NOTE — Progress Notes (Signed)
Subjective:    Patient ID: Robert Mcpherson, male    DOB: 1976/08/14, 46 y.o.   MRN: [id number]  HPI I have not seen the patient quite some time.  Since I last saw him he states that he is battling severe depression.  He is not even leaving the house.  Previously he was operating a long care business.  He estimates that he is lost more than $600,000 over the last 2 years and the majority of his savings because he will not leave to go to work.  His wife has had several "nervous breakdowns".  He is caring for his 4 children.  As result, he states that he feels severely depressed.  He hates to be around other people in crowds.  He is having trouble sleeping.  He denies any manic symptoms or suicidal ideation.  He denies any hallucinations.  However the Zoloft is no longer working for the patient.  He denies any homicidal ideation.  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   Nasal turbinate hypertrophy 09/2013   OCD (obsessive compulsive disorder) 04/02/2014   Sleep apnea    Past Surgical History:  Procedure Laterality Date   APPENDECTOMY     LUMBAR LAMINECTOMY/DECOMPRESSION MICRODISCECTOMY Left 12/02/2017   Procedure: Left Lumbar 4-5 disectomy;  Surgeon: Melina Schools, MD;  Location: [place];  Service: Orthopedics;  Laterality: Left;  2 hrs   NASAL SEPTOPLASTY W/ TURBINOPLASTY Bilateral 10/02/2013   Procedure: BILATERAL NASAL SEPTOPLASTY WITH TURBINATE REDUCTION;  Surgeon: Izora Gala, MD;  Location: [place];  Service: ENT;  Laterality: Bilateral;   SHOULDER ARTHROSCOPY Right 11/2013   VASECTOMY  06/02/2013   Current Outpatient Medications on File Prior to Visit  Medication Sig Dispense Refill   clonazePAM (KLONOPIN) 1 MG tablet Take 1 tablet (1 mg total) by mouth 3 (three) times daily as needed  for anxiety (NEEDS OV, LAST REFILL). 10 tablet 0   EPINEPHrine 0.3 mg/0.3 mL IJ SOAJ injection Inject 0.3 mg into the muscle as needed for anaphylaxis. 1 each 1   MAGNESIUM PO Take by mouth.     No current facility-administered medications on file prior to visit.   Allergies  Allergen Reactions   Cephalexin Hives   Social History   Socioeconomic History   Marital status: Married    Spouse name: Not on file   Number of children: Not on file   Years of education: Not on file   Highest education level: Not on file  Occupational History   Not on file  Tobacco Use   Smoking status: Never   Smokeless tobacco: Never  Vaping Use   Vaping Use: Never used  Substance and Sexual Activity   Alcohol use: No    Comment: history of alcohol use in his 78s, none now    Drug use: Yes    Types: Marijuana   Sexual activity: Yes  Other Topics Concern   Not on file  Social History Narrative   Not on file   Social Determinants of Health   Financial Resource Strain: Not on file  Food Insecurity: Not on file  Transportation Needs: Not on file  Physical Activity: Not on file  Stress: Not on file  Social Connections: Not on file  Intimate Partner Violence: Not on file      Review of Systems  All other systems reviewed and are negative.      Objective:   Physical Exam Vitals reviewed.  Constitutional:      Appearance: Normal appearance. He is obese.  Cardiovascular:     Rate and Rhythm: Normal rate and regular rhythm.  Pulmonary:     Effort: Pulmonary effort is normal.     Breath sounds: Normal breath sounds.  Abdominal:     General: Bowel sounds are normal. There is no distension.     Palpations: Abdomen is soft. There is no mass.  Neurological:     Mental Status: He is alert.  Psychiatric:        Behavior: Behavior normal.        Thought Content: Thought content normal.        Judgment: Judgment normal.           Assessment & Plan:   Weight loss, unintentional -  Plan: CBC with Differential/Platelet, COMPLETE METABOLIC PANEL WITH GFR, Hemoglobin A1c  MDD (major depressive disorder), recurrent episode, moderate (HCC) First we will stop Zoloft and replace it with Effexor extended release 75 mg p.o. every morning.  Uptitrate to 150 mg p.o. every morning in 2 weeks and then reassess in 1 month.  I will make no changes in his Klonopin dose at the present time and reassess in 1 month.  Given the unintentional weight loss I will check a CBC a CMP and an A1c as the patient has a very strong family history of diabetes.  Spite the weight loss he is interested in possibly trying Wegovy.  As his BMI is still 37

## 2022-09-08 LAB — COMPLETE METABOLIC PANEL WITH GFR
AG Ratio: 2.1 (calc) (ref 1.0–2.5)
ALT: 67 U/L — ABNORMAL HIGH (ref 9–46)
AST: 27 U/L (ref 10–40)
Albumin: 4.8 g/dL (ref 3.6–5.1)
Alkaline phosphatase (APISO): 54 U/L (ref 36–130)
BUN: 19 mg/dL (ref 7–25)
CO2: 29 mmol/L (ref 20–32)
Calcium: 9.8 mg/dL (ref 8.6–10.3)
Chloride: 104 mmol/L (ref 98–110)
Creat: 0.94 mg/dL (ref 0.60–1.29)
Globulin: 2.3 g/dL (calc) (ref 1.9–3.7)
Glucose, Bld: 99 mg/dL (ref 65–99)
Potassium: 4.6 mmol/L (ref 3.5–5.3)
Sodium: 140 mmol/L (ref 135–146)
Total Bilirubin: 0.4 mg/dL (ref 0.2–1.2)
Total Protein: 7.1 g/dL (ref 6.1–8.1)
eGFR: 102 mL/min/{1.73_m2} (ref >=60)

## 2022-09-08 LAB — CBC WITH DIFFERENTIAL/PLATELET
Absolute Monocytes: 714 cells/uL (ref 200–950)
Basophils Absolute: 60 cells/uL (ref 0–200)
Basophils Relative: 0.7 %
Eosinophils Absolute: 272 cells/uL (ref 15–500)
Eosinophils Relative: 3.2 %
HCT: 48.9 % (ref 38.5–50.0)
Hemoglobin: 16.9 g/dL (ref 13.2–17.1)
Lymphs Abs: 3162 cells/uL (ref 850–3900)
MCH: 29.1 pg (ref 27.0–33.0)
MCHC: 34.6 g/dL (ref 32.0–36.0)
MCV: 84.3 fL (ref 80.0–100.0)
MPV: 10.2 fL (ref 7.5–12.5)
Monocytes Relative: 8.4 %
Neutro Abs: 4293 cells/uL (ref 1500–7800)
Neutrophils Relative %: 50.5 %
Platelets: 311 10*3/uL (ref 140–400)
RBC: 5.8 10*6/uL (ref 4.20–5.80)
RDW: 13.3 % (ref 11.0–15.0)
Total Lymphocyte: 37.2 %
WBC: 8.5 10*3/uL (ref 3.8–10.8)

## 2022-09-08 LAB — HEMOGLOBIN A1C
Hgb A1c MFr Bld: 5.4 % of total Hgb (ref <5.7)
Mean Plasma Glucose: 108 mg/dL
eAG (mmol/L): 6 mmol/L

## 2022-09-09 ENCOUNTER — Other Ambulatory Visit: Payer: Self-pay

## 2022-09-09 ENCOUNTER — Telehealth: Payer: Self-pay | Admitting: Family Medicine

## 2022-09-09 DIAGNOSIS — G4733 Obstructive sleep apnea (adult) (pediatric): Secondary | ICD-10-CM

## 2022-09-09 NOTE — Telephone Encounter (Signed)
Patient called to request referral to address sleep apnea; referral going to [facility]. Fax number is [phone].  Patient unsure if he needs another appointment at this office before the referral will be sent.   Please advise at [phone].

## 2022-09-14 ENCOUNTER — Other Ambulatory Visit: Payer: Self-pay | Admitting: Family Medicine

## 2022-09-14 ENCOUNTER — Ambulatory Visit: Payer: Medicaid Other | Admitting: Family Medicine

## 2022-09-14 VITALS — BP 126/82 | HR 84 | Ht 69.0 in | Wt 252.4 lb

## 2022-09-14 DIAGNOSIS — G4733 Obstructive sleep apnea (adult) (pediatric): Secondary | ICD-10-CM | POA: Diagnosis not present

## 2022-09-14 MED ORDER — WEGOVY 1 MG/0.5ML ~~LOC~~ SOAJ: 1.0000 mg | SUBCUTANEOUS | 0 refills | Status: DC

## 2022-09-14 MED ORDER — WEGOVY 0.5 MG/0.5ML ~~LOC~~ SOAJ: 0.5000 mg | SUBCUTANEOUS | 1 refills | Status: DC

## 2022-09-14 NOTE — Progress Notes (Signed)
Subjective:    Patient ID: Robert Mcpherson, male    DOB: 10/24/1976, 46 y.o.   MRN: [id number]  HPI 09/07/22  I have not seen the patient quite some time.  Since I last saw him he states that he is battling severe depression.  He is not even leaving the house.  Previously he was operating a long care business.  He estimates that he is lost more than $600,000 over the last 2 years and the majority of his savings because he will not leave to go to work.  His wife has had several "nervous breakdowns".  He is caring for his 4 children.  As result, he states that he feels severely depressed.  He hates to be around other people in crowds.  He is having trouble sleeping.  He denies any manic symptoms or suicidal ideation.  He denies any hallucinations.  However the Zoloft is no longer working for the patient.  He denies any homicidal ideation.  He states that he has lost 30 pounds unintentionally.  At that time, my plan was:  First we will stop Zoloft and replace it with Effexor extended release 75 mg p.o. every morning.  Uptitrate to 150 mg p.o. every morning in 2 weeks and then reassess in 1 month.  I will make no changes in his Klonopin dose at the present time and reassess in 1 month.  Given the unintentional weight loss I will check a CBC a CMP and an A1c as the patient has a very strong family history of diabetes.  Spite the weight loss he is interested in possibly trying Wegovy.  As his BMI is still 37  Office Visit on 09/07/2022  Component Date Value Ref Range Status   WBC 09/07/2022 8.5  3.8 - 10.8 Thousand/uL Final   RBC 09/07/2022 5.80  4.20 - 5.80 Million/uL Final   Hemoglobin 09/07/2022 16.9  13.2 - 17.1 g/dL Final   HCT 09/07/2022 48.9  38.5 - 50.0 % Final   MCV 09/07/2022 84.3  80.0 - 100.0 fL Final   MCH 09/07/2022 29.1  27.0 - 33.0 pg Final   MCHC 09/07/2022 34.6  32.0 - 36.0 g/dL Final   RDW 09/07/2022 13.3  11.0 - 15.0 % Final   Platelets 09/07/2022 311  140 - 400 Thousand/uL Final    MPV 09/07/2022 10.2  7.5 - 12.5 fL Final   Neutro Abs 09/07/2022 4,293  1,500 - 7,800 cells/uL Final   Lymphs Abs 09/07/2022 3,162  850 - 3,900 cells/uL Final   Absolute Monocytes 09/07/2022 714  200 - 950 cells/uL Final   Eosinophils Absolute 09/07/2022 272  15 - 500 cells/uL Final   Basophils Absolute 09/07/2022 60  0 - 200 cells/uL Final   Neutrophils Relative % 09/07/2022 50.5  % Final   Total Lymphocyte 09/07/2022 37.2  % Final   Monocytes Relative 09/07/2022 8.4  % Final   Eosinophils Relative 09/07/2022 3.2  % Final   Basophils Relative 09/07/2022 0.7  % Final   Glucose, Bld 09/07/2022 99  65 - 99 mg/dL Final   Comment: .            Fasting reference interval .    BUN 09/07/2022 19  7 - 25 mg/dL Final   Creat 09/07/2022 0.94  0.60 - 1.29 mg/dL Final   eGFR 09/07/2022 102  > OR = 60 mL/min/1.53m Final   BUN/Creatinine Ratio 09/07/2022 SEE NOTE:  6 - 22 (calc) Final   Comment:  Not Reported: BUN and Creatinine are within    reference range. .    Sodium 09/07/2022 140  135 - 146 mmol/L Final   Potassium 09/07/2022 4.6  3.5 - 5.3 mmol/L Final   Chloride 09/07/2022 104  98 - 110 mmol/L Final   CO2 09/07/2022 29  20 - 32 mmol/L Final   Calcium 09/07/2022 9.8  8.6 - 10.3 mg/dL Final   Total Protein 09/07/2022 7.1  6.1 - 8.1 g/dL Final   Albumin 09/07/2022 4.8  3.6 - 5.1 g/dL Final   Globulin 09/07/2022 2.3  1.9 - 3.7 g/dL (calc) Final   AG Ratio 09/07/2022 2.1  1.0 - 2.5 (calc) Final   Total Bilirubin 09/07/2022 0.4  0.2 - 1.2 mg/dL Final   Alkaline phosphatase (APISO) 09/07/2022 54  36 - 130 U/L Final   AST 09/07/2022 27  10 - 40 U/L Final   ALT 09/07/2022 67 (H)  9 - 46 U/L Final   Hgb A1c MFr Bld 09/07/2022 5.4  <5.7 % of total Hgb Final   Comment: For the purpose of screening for the presence of diabetes: . <5.7%       Consistent with the absence of diabetes 5.7-6.4%    Consistent with increased risk for diabetes             (prediabetes) > or =6.5%  Consistent  with diabetes . This assay result is consistent with a decreased risk of diabetes. . Currently, no consensus exists regarding use of hemoglobin A1c for diagnosis of diabetes in children. . According to American Diabetes Association (ADA) guidelines, hemoglobin A1c <7.0% represents optimal control in non-pregnant diabetic patients. Different metrics may apply to specific patient populations.  Standards of Medical Care in Diabetes(ADA). .    Mean Plasma Glucose 09/07/2022 108  mg/dL Final   eAG (mmol/L) 09/07/2022 6.0  mmol/L Final    09/14/22 Patient was diagnosed with obstructive sleep apnea in 2019.  He wore his CPAP machine faithfully for 1 year until 2020.  However he had a difficult time tolerating machine.  He has a significant problem wearing the mask and keeping it on his face.  The apparatus causes him neck pain.  Over time, he discontinued wearing the mask.  However he feels extremely tired and lethargic.  He saw significant improvement while treating his sleep apnea and he is very interested in surgical options to treat obstructive sleep apnea such as inspire.  He also is interested in weight loss given the 37.  He states that he has tried a low carbohydrate low-fat diet and regular aerobic exercise with minimal success. 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   Nasal turbinate hypertrophy 09/2013   OCD (obsessive compulsive disorder) 04/02/2014   Sleep apnea    Past Surgical History:  Procedure Laterality Date   APPENDECTOMY     LUMBAR LAMINECTOMY/DECOMPRESSION MICRODISCECTOMY Left 12/02/2017   Procedure: Left Lumbar 4-5 disectomy;  Surgeon: Melina Schools, MD;  Location: [place];  Service: Orthopedics;  Laterality: Left;  2 hrs   NASAL SEPTOPLASTY W/ TURBINOPLASTY Bilateral 10/02/2013   Procedure: BILATERAL NASAL SEPTOPLASTY WITH TURBINATE  REDUCTION;  Surgeon: Izora Gala, MD;  Location: [place];  Service: ENT;  Laterality: Bilateral;   SHOULDER ARTHROSCOPY Right 11/2013   VASECTOMY  06/02/2013   Current  Outpatient Medications on File Prior to Visit  Medication Sig Dispense Refill   clonazePAM (KLONOPIN) 1 MG tablet Take 1 tablet (1 mg total) by mouth 3 (three) times daily as needed for anxiety (NEEDS OV, LAST REFILL). 10 tablet 0   EPINEPHrine 0.3 mg/0.3 mL IJ SOAJ injection Inject 0.3 mg into the muscle as needed for anaphylaxis. 1 each 1   MAGNESIUM PO Take by mouth.     venlafaxine XR (EFFEXOR XR) 75 MG 24 hr capsule Take 2 capsules (150 mg total) by mouth daily with breakfast. 60 capsule 3   No current facility-administered medications on file prior to visit.   Allergies  Allergen Reactions   Cephalexin Hives   Social History   Socioeconomic History   Marital status: Married    Spouse name: Not on file   Number of children: Not on file   Years of education: Not on file   Highest education level: Not on file  Occupational History   Not on file  Tobacco Use   Smoking status: Never   Smokeless tobacco: Never  Vaping Use   Vaping Use: Never used  Substance and Sexual Activity   Alcohol use: No    Comment: history of alcohol use in his 78s, none now    Drug use: Yes    Types: Marijuana   Sexual activity: Yes  Other Topics Concern   Not on file  Social History Narrative   Not on file   Social Determinants of Health   Financial Resource Strain: Not on file  Food Insecurity: Not on file  Transportation Needs: Not on file  Physical Activity: Not on file  Stress: Not on file  Social Connections: Not on file  Intimate Partner Violence: Not on file      Review of Systems  All other systems reviewed and are negative.      Objective:   Physical Exam Vitals reviewed.  Constitutional:      Appearance: Normal appearance. He is obese.  Cardiovascular:     Rate and Rhythm: Normal  rate and regular rhythm.  Pulmonary:     Effort: Pulmonary effort is normal.     Breath sounds: Normal breath sounds.  Abdominal:     General: Bowel sounds are normal. There is no distension.     Palpations: Abdomen is soft. There is no mass.  Neurological:     Mental Status: He is alert.  Psychiatric:        Behavior: Behavior normal.        Thought Content: Thought content normal.        Judgment: Judgment normal.           Assessment & Plan:  Obstructive sleep apnea treated with continuous positive airway pressure (CPAP) - Plan: Ambulatory referral to ENT I will consult ENT to discuss inspire.  We will try Wegovy 0.5 mg subcu weekly and uptitrate monthly as tolerated to facilitate weight loss

## 2022-09-16 ENCOUNTER — Telehealth: Payer: Self-pay

## 2022-09-16 NOTE — Telephone Encounter (Signed)
My Chart Message from patient: I spoke to [facility] and he said that nowhere in a [address] do they have the .5mg  or 1 mg of the [facility]. He said there are other ones that are similar that are available. Can you please ask Dr. [facility] what he suggests?   What about Dulaglutide? What is Dr. Tanya Nones thoughts on this?

## 2022-09-16 NOTE — Telephone Encounter (Signed)
My Chart Message from patient: I spoke to [facility] and he said that nowhere in a [address] do they have the .5mg  or 1 mg of the [facility]. He said there are other ones that are similar that are available. Can you please ask Dr. [facility] what he suggests?

## 2022-09-30 ENCOUNTER — Telehealth: Payer: Self-pay

## 2022-09-30 ENCOUNTER — Other Ambulatory Visit: Payer: Self-pay | Admitting: Family Medicine

## 2022-09-30 MED ORDER — CLONAZEPAM 1 MG PO TABS: 1.0000 mg | ORAL_TABLET | Freq: Three times a day (TID) | ORAL | 0 refills | Status: DC | PRN

## 2022-09-30 NOTE — Telephone Encounter (Signed)
Pt called in stating that he is completely out of this med clonazePAM (KLONOPIN) 1 MG tablet [[id number]] and has been going thru some withdrawals from not having this med. Informed pt that he would need to schedule a cpe with pcp for future refills. Pt stated that he was unaware of this. Pt is asking for a courtesy refill until OV next week 12/7.Please call pt if able to provide a courtesy refill please.  LOV: 09/07/22  Cb#: [phone]

## 2022-10-08 ENCOUNTER — Ambulatory Visit: Payer: Medicaid Other | Admitting: Family Medicine

## 2022-10-08 ENCOUNTER — Encounter: Payer: Self-pay | Admitting: Family Medicine

## 2022-10-08 VITALS — BP 128/72 | HR 102 | Ht 69.0 in | Wt 260.6 lb

## 2022-10-08 DIAGNOSIS — F331 Major depressive disorder, recurrent, moderate: Secondary | ICD-10-CM

## 2022-10-08 MED ORDER — VENLAFAXINE HCL ER 75 MG PO CP24: 225.0000 mg | ORAL_CAPSULE | Freq: Every day | ORAL | 3 refills | Status: DC

## 2022-10-08 MED ORDER — ZEPBOUND 2.5 MG/0.5ML ~~LOC~~ SOAJ: 2.5000 mg | SUBCUTANEOUS | 1 refills | Status: DC

## 2022-10-08 NOTE — Progress Notes (Signed)
Subjective:    Patient ID: Robert Mcpherson, male    DOB: [birth year], 46 y.o.   MRN: [id number]  HPI 09/07/22 I have not seen the patient quite some time.  Since I last saw him he states that he is battling severe depression.  He is not even leaving the house.  Previously he was operating a long care business.  He estimates that he is lost more than $600,000 over the last 2 years and the majority of his savings because he will not leave to go to work.  His wife has had several "nervous breakdowns".  He is caring for his 4 children.  As result, he states that he feels severely depressed.  He hates to be around other people in crowds.  He is having trouble sleeping.  He denies any manic symptoms or suicidal ideation.  He denies any hallucinations.  However the Zoloft is no longer working for the patient.  He denies any homicidal ideation.  He states that he has lost 30 pounds unintentionally.  At that time, my plan was:  First we will stop Zoloft and replace it with Effexor extended release 75 mg p.o. every morning.  Uptitrate to 150 mg p.o. every morning in 2 weeks and then reassess in 1 month.  I will make no changes in his Klonopin dose at the present time and reassess in 1 month.  Given the unintentional weight loss I will check a CBC a CMP and an A1c as the patient has a very strong family history of diabetes.  Despite the weight loss he is interested in possibly trying Wegovy as his BMI is still 37.  10/08/22 Patient states that he sees a little bit of benefit on the Effexor.  He is currently taking 150 mg p.o. daily.  He denies any suicidal ideation.  He still has trouble getting motivated and getting out of bed having the energy to "do anything".  He also wants assistance with weight loss.  He is unable to find Wegovy in the pharmacies.  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   Nasal turbinate hypertrophy 09/2013   OCD (obsessive compulsive disorder) 04/02/2014   Sleep apnea    Past Surgical History:  Procedure Laterality Date   APPENDECTOMY     LUMBAR LAMINECTOMY/DECOMPRESSION MICRODISCECTOMY Left 12/02/2017   Procedure: Left Lumbar 4-5 disectomy;  Surgeon: Venita Lick, MD;  Location: [facility];  Service: Orthopedics;  Laterality: Left;  2 hrs   NASAL SEPTOPLASTY W/ TURBINOPLASTY Bilateral 10/02/2013   Procedure: BILATERAL NASAL SEPTOPLASTY WITH TURBINATE REDUCTION;  Surgeon: Serena Colonel, MD;  Location: [facility];  Service: ENT;  Laterality: Bilateral;   SHOULDER ARTHROSCOPY Right 11/2013   VASECTOMY  06/02/2013   Current Outpatient Medications on File Prior to Visit  Medication Sig Dispense Refill   Semaglutide-Weight Management (WEGOVY) 1 MG/0.5ML SOAJ Inject 1 mg into the skin once a week. 2 mL 0   clonazePAM (KLONOPIN) 1 MG tablet Take 1 tablet (1 mg total) by mouth 3 (three) times daily as needed for anxiety. 90 tablet 0   EPINEPHrine 0.3 mg/0.3 mL IJ SOAJ injection Inject 0.3 mg into the muscle as needed for anaphylaxis. 1 each 1  MAGNESIUM PO Take by mouth.     venlafaxine XR (EFFEXOR XR) 75 MG 24 hr capsule Take 2 capsules (150 mg total) by mouth daily with breakfast. 60 capsule 3   No current facility-administered medications on file prior to visit.   Allergies  Allergen Reactions   Cephalexin Hives   Social History   Socioeconomic History   Marital status: Married    Spouse name: Not on file   Number of children: Not on file   Years of education: Not on file   Highest education level: Not on file  Occupational History   Not on file  Tobacco Use   Smoking status: Never   Smokeless tobacco: Never  Vaping Use   Vaping Use: Never used  Substance and Sexual Activity   Alcohol use: No    Comment: history of alcohol use in his  28s, none now    Drug use: Yes    Types: Marijuana   Sexual activity: Yes  Other Topics Concern   Not on file  Social History Narrative   Not on file   Social Determinants of Health   Financial Resource Strain: Not on file  Food Insecurity: Not on file  Transportation Needs: Not on file  Physical Activity: Not on file  Stress: Not on file  Social Connections: Not on file  Intimate Partner Violence: Not on file      Review of Systems  All other systems reviewed and are negative.      Objective:   Physical Exam Vitals reviewed.  Constitutional:      Appearance: Normal appearance. He is obese.  Cardiovascular:     Rate and Rhythm: Normal rate and regular rhythm.  Pulmonary:     Effort: Pulmonary effort is normal.     Breath sounds: Normal breath sounds.  Abdominal:     General: Bowel sounds are normal. There is no distension.     Palpations: Abdomen is soft. There is no mass.  Neurological:     Mental Status: He is alert.  Psychiatric:        Behavior: Behavior normal.        Thought Content: Thought content normal.        Judgment: Judgment normal.           Assessment & Plan:   MDD (major depressive disorder), recurrent episode, moderate (HCC) Increase Effexor to 225 mg daily.  Gave the patient prescription forZepbound 2.5 mg subcu weekly and will uptitrate as tolerated for weight loss.  Reassess in 1 month.

## 2022-10-13 NOTE — Progress Notes (Deleted)
[facility]  PATIENT: Robert Mcpherson DOB: 11-18-1975   [facility] provider: Dr. Frances Furbish REASON FOR VISIT: OSA on CPAP HISTORY FROM: Patient    HISTORY OF PRESENT ILLNESS:   Update 10/14/2022 JM: Patient returns for CPAP compliance visit.       History provided for reference purposes only Update 10/08/2021 JM: Returns for yearly CPAP follow-up.  He has not been using his CPAP over the past several months due to difficulty tolerating facemask and running out of supplies and of June.  At prior visit, order placed to be refitted with a different mask due to difficulty tolerating nasal pillows with history of deviated septum repair 6 to 7 years ago and right nostril pretty much closed.  He apparently was never contacted to be refitted by [facility].  He does wish to restart therapy once he has more appropriate mask.  No further concerns at this time  Update 04/30/2020 JM: Robert Mcpherson is being seen via virtual visit for CPAP follow-up.  Compliance report from 12/15/2019 -01/13/2020 shows 19 out of 30 usage days with 15 days greater than 4 hours for a 50% compliance.  Average usage 4 hours and 52 minutes.  Residual AHI 0.8 with min pressure 7 and max pressure 15 with a EPR level 1.  Pressure in the 95th percentile 11.5.  Leaks in the 95th percentile 0.9.  He reports having difficulty using prior mask due to nasal discomfort hence the reason for noncompliance He has noticed increased daytime fatigue and difficulty sleeping at night when not using CPAP  UPDATE 3/11/2020CM Robert Mcpherson, 46 year old male returns for follow-up with newly diagnosed severe obstructive sleep apnea here for his initial CPAP.  Patient claims he feels so much better, he is well rested every morning and he is sleeping so much better.  CPAP data dated 12/10/2018-01/08/2019 shows compliance greater than 4 hours at 100%.  Average usage 7 hours 4 minutes.  Set pressure 7 to 15 cm.  EPR level 1 leak 95th percentile 1.4.  AHI 0.7  ESS 4.  He returns for reevaluation.  Initial consult visit 09/06/18 Dr. Frances Furbish: Robert Mcpherson is a 46 year old right-handed gentleman with an underlying medical history of hypertriglyceridemia, OCD, anxiety, depression, recent status post lumbar laminectomy, status post septoplasty and turbinate reduction, shoulder arthroscopic surgery, and history of obesity, who reports snoring and excessive daytime somnolence. I reviewed your office note from 06/10/2018. He had a sleep study 5 years ago which showed mild sleep apnea. I reviewed his PSG report from 09/11/2013, study was interpreted by Dr. Maple Hudson. Of note, his weight was recorded to be 200 tablet at the time, BMI of 31. Overall AHI was 11 per hour. He achieved all stages of sleep with 15.9% of REM sleep, sleep latency was 45 minutes, REM latency 78 minutes. Average oxygen saturation was 93.2%, nadir was 83%. No significant PLMs. His Epworth sleepiness score is 10 out of 24 today, fatigue score is 56 out of 56 (one question was not answered, so total score is out of 56, not 63). Is married and lives with his wife and children. He has a total of 4 children and is self-employed, Radio broadcast assistant. He is a nonsmoker and drinks alcohol occasionally, maybe twice a month or so and drinks caffeine daily in the form of iced coffee about 2 per day, large servings, which helps him stay alert during the day. He reports significant weight gain secondary to back pain. He has a family history of sleep apnea and has  several uncles and cousins on CPAP machines. He would be willing to try CPAP. He did not have much in the way of improvement of his nasal breathing after nasal surgery. He does not keep a very set schedule for sleep. He reports sleeping some during the day and also somewhat night. Typical bedtime is after midnight, typically before 2 AM. Rise time is around 8. He takes a 3-4 hour nap every day around 1 or 2 PM. He has 4 younger children, ages 42, 47, 53, and for. They are all  homeschooled. He denies night to night nocturia but has had morning headaches. He has had mood irritability especially when he is sleep deprived or not sleeping well.  REVIEW OF SYSTEMS: Full 14 system review of systems performed and notable only for those listed, all others are neg:  Constitutional: neg  Cardiovascular: neg Ear/Nose/Throat: neg  Skin: neg Eyes: neg Respiratory: neg Gastroitestinal: neg  Hematology/Lymphatic: neg  Endocrine: neg Musculoskeletal:neg Allergy/Immunology: neg Neurological: neg Psychiatric: neg Sleep : Obstructive sleep apnea with CPAP   ALLERGIES: Allergies  Allergen Reactions   Cephalexin Hives    HOME MEDICATIONS: Outpatient Medications Prior to Visit  Medication Sig Dispense Refill   clonazePAM (KLONOPIN) 1 MG tablet Take 1 tablet (1 mg total) by mouth 3 (three) times daily as needed for anxiety. 90 tablet 0   EPINEPHrine 0.3 mg/0.3 mL IJ SOAJ injection Inject 0.3 mg into the muscle as needed for anaphylaxis. 1 each 1   MAGNESIUM PO Take by mouth.     Tirzepatide-Weight Management (ZEPBOUND) 2.5 MG/0.5ML SOAJ Inject 2.5 mg into the skin once a week. 2 mL 1   venlafaxine XR (EFFEXOR XR) 75 MG 24 hr capsule Take 3 capsules (225 mg total) by mouth daily with breakfast. 90 capsule 3   No facility-administered medications prior to visit.    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   Nasal turbinate hypertrophy 09/2013   OCD (obsessive compulsive disorder) 04/02/2014   Sleep apnea     PAST SURGICAL HISTORY: Past Surgical History:  Procedure Laterality Date   APPENDECTOMY     LUMBAR LAMINECTOMY/DECOMPRESSION MICRODISCECTOMY Left 12/02/2017   Procedure: Left Lumbar 4-5 disectomy;  Surgeon: Melina Schools, MD;  Location: [place];  Service: Orthopedics;  Laterality: Left;  2 hrs   NASAL  SEPTOPLASTY W/ TURBINOPLASTY Bilateral 10/02/2013   Procedure: BILATERAL NASAL SEPTOPLASTY WITH TURBINATE REDUCTION;  Surgeon: Izora Gala, MD;  Location: [facility];  Service: ENT;  Laterality: Bilateral;   SHOULDER ARTHROSCOPY Right 11/2013   VASECTOMY  06/02/2013    FAMILY HISTORY: Family History  Problem Relation Age of Onset   Heart disease Maternal Uncle 20       heart attack   Heart disease Maternal Uncle 61       CVA   Colon cancer Neg Hx    Colon polyps Neg Hx    Liver disease Neg Hx     SOCIAL HISTORY: Social History   Socioeconomic History   Marital status: Married    Spouse name: Not on file   Number of children: Not on file   Years of education: Not on file   Highest education level: Not on file  Occupational History   Not on file  Tobacco Use   Smoking  status: Never   Smokeless tobacco: Never  Vaping Use   Vaping Use: Never used  Substance and Sexual Activity   Alcohol use: No    Comment: history of alcohol use in his 49s, none now    Drug use: Yes    Types: Marijuana   Sexual activity: Yes  Other Topics Concern   Not on file  Social History Narrative   Not on file   Social Determinants of Health   Financial Resource Strain: Not on file  Food Insecurity: Not on file  Transportation Needs: Not on file  Physical Activity: Not on file  Stress: Not on file  Social Connections: Not on file  Intimate Partner Violence: Not on file     PHYSICAL EXAM General: well developed, well nourished, very pleasant middle-age Caucasian male, seated, in no evident distress Cardiovascular: regular rate and rhythm, no murmurs  Neurologic Exam Mental Status: Awake and fully alert.  Fluent speech and language.  Oriented to place and time. Recent and remote memory intact. Attention span, concentration and fund of knowledge appropriate. Mood and affect appropriate.  Cranial Nerves: Pupils equal, briskly reactive to light. Extraocular movements full  without nystagmus. Visual fields full to confrontation. Hearing intact. Facial sensation intact. Face, tongue, palate moves normally and symmetrically.  Motor: Normal bulk and tone. Normal strength in all tested extremity muscles Sensory.: intact to touch , pinprick , position and vibratory sensation.  Coordination: Rapid alternating movements normal in all extremities. Finger-to-nose and heel-to-shin performed accurately bilaterally. Gait and Station: Arises from chair without difficulty. Stance is normal. Gait demonstrates normal stride length and balance out use of AD. Tandem walk and heel toe without difficulty.  Reflexes: 1+ and symmetric. Toes downgoing.       DIAGNOSTIC DATA (LABS, IMAGING, TESTING) - I reviewed patient records, labs, notes, testing and imaging myself where available.  Lab Results  Component Value Date   WBC 8.5 09/07/2022   HGB 16.9 09/07/2022   HCT 48.9 09/07/2022   MCV 84.3 09/07/2022   PLT 311 09/07/2022      Component Value Date/Time   NA 140 09/07/2022 1504   K 4.6 09/07/2022 1504   CL 104 09/07/2022 1504   CO2 29 09/07/2022 1504   GLUCOSE 99 09/07/2022 1504   BUN 19 09/07/2022 1504   CREATININE 0.94 09/07/2022 1504   CALCIUM 9.8 09/07/2022 1504   PROT 7.1 09/07/2022 1504   ALBUMIN 5.0 04/25/2021 1537   AST 27 09/07/2022 1504   ALT 67 (H) 09/07/2022 1504   ALKPHOS 53 04/25/2021 1537   BILITOT 0.4 09/07/2022 1504   GFRNONAA >60 04/25/2021 1537   GFRNONAA 87 08/25/2016 0950   GFRAA >60 11/29/2017 1156   GFRAA >89 08/25/2016 0950   Lab Results  Component Value Date   CHOL 238 (H) 06/23/2021   HDL 37 (L) 06/23/2021   LDLCALC 164 (H) 06/23/2021   TRIG 207 (H) 06/23/2021   CHOLHDL 6.4 (H) 06/23/2021    Lab Results  Component Value Date   TSH 1.437 02/22/2013      ASSESSMENT AND PLAN  Robert Mcpherson is a very pleasant 46 y.o.-year old male with for severe obstructive sleep apnea and CPAP follow-up.   CPAP noncompliance due to  difficulty tolerating current mask - AGAIN will place order to DME company requesting changing of mask as well as receiving ongoing supplies. He was advised to call office after 1 to 2 weeks if he does not receive a phone call Discussed importance of  compliance with CPAP He is aware to continue to follow with DME company [facility] for ongoing supplies or CPAP related concerns   Follow-up in 1 year or call earlier if needed   CC: Susy Frizzle, MD    I spent 20 minutes of face-to-face via video visit and non-face-to-face time with patient.  This included previsit chart review, lab review, study review, order entry, electronic health record documentation, patient education and discussion regarding history of CPAP and CPAP noncompliance due to difficulty tolerating current mask, importance of nightly CPAP use and answered all other questions to patient satisfaction   Frann Rider, AGNP-BC  [facility] [address] [place], [place] [address]  Phone [phone] Fax [phone] Note: This document was prepared with digital dictation and possible smart phrase technology. Any transcriptional errors that result from this process are unintentional.  I reviewed the above note and documentation by the Nurse Practitioner and agree with the history, exam, assessment and plan as outlined above. I was available for consultation. Star Age, MD, PhD [facility])

## 2022-10-14 ENCOUNTER — Ambulatory Visit: Payer: 59 | Admitting: Adult Health

## 2022-10-15 ENCOUNTER — Ambulatory Visit: Payer: 59 | Admitting: Adult Health

## 2022-11-05 ENCOUNTER — Encounter: Payer: Self-pay | Admitting: Family Medicine

## 2022-11-05 ENCOUNTER — Other Ambulatory Visit: Payer: Self-pay | Admitting: Family Medicine

## 2022-11-05 MED ORDER — ZEPBOUND 5 MG/0.5ML ~~LOC~~ SOAJ: 5.0000 mg | SUBCUTANEOUS | 3 refills | Status: DC

## 2022-11-10 ENCOUNTER — Other Ambulatory Visit: Payer: Self-pay | Admitting: Family Medicine

## 2022-11-10 ENCOUNTER — Encounter: Payer: Self-pay | Admitting: Family Medicine

## 2022-11-10 ENCOUNTER — Ambulatory Visit: Payer: Medicaid Other | Admitting: Family Medicine

## 2022-11-10 VITALS — BP 124/76 | HR 116 | Temp 98.6°F | Ht 69.0 in | Wt 263.0 lb

## 2022-11-10 DIAGNOSIS — R5383 Other fatigue: Secondary | ICD-10-CM

## 2022-11-10 MED ORDER — CLONAZEPAM 1 MG PO TABS: 1.0000 mg | ORAL_TABLET | Freq: Three times a day (TID) | ORAL | 0 refills | Status: DC | PRN

## 2022-11-10 NOTE — Progress Notes (Signed)
Subjective:    Patient ID: Robert Mcpherson, male    DOB: [birth year], 47 y.o.   MRN: [id number]  HPI 09/07/22 I have not seen the patient quite some time.  Since I last saw him he states that he is battling severe depression.  He is not even leaving the house.  Previously he was operating a long care business.  He estimates that he is lost more than $600,000 over the last 2 years and the majority of his savings because he will not leave to go to work.  His wife has had several "nervous breakdowns".  He is caring for his 4 children.  As result, he states that he feels severely depressed.  He hates to be around other people in crowds.  He is having trouble sleeping.  He denies any manic symptoms or suicidal ideation.  He denies any hallucinations.  However the Zoloft is no longer working for the patient.  He denies any homicidal ideation.  He states that he has lost 30 pounds unintentionally.  At that time, my plan was:  First we will stop Zoloft and replace it with Effexor extended release 75 mg p.o. every morning.  Uptitrate to 150 mg p.o. every morning in 2 weeks and then reassess in 1 month.  I will make no changes in his Klonopin dose at the present time and reassess in 1 month.  Given the unintentional weight loss I will check a CBC a CMP and an A1c as the patient has a very strong family history of diabetes.  Despite the weight loss he is interested in possibly trying Wegovy as his BMI is still 37.  10/08/22 Patient states that he sees a little bit of benefit on the Effexor.  He is currently taking 150 mg p.o. daily.  He denies any suicidal ideation.  He still has trouble getting motivated and getting out of bed having the energy to "do anything".  He also wants assistance with weight loss.  He is unable to find Wegovy in the pharmacies.  He believes that he can find the equivalent of Mounjaro for weight loss called Zepbound.  At that time, my plan was:  Increase Effexor to 225 mg daily.  Gave the  patient prescription for Zepbound 2.5 mg subcu weekly and will uptitrate as tolerated for weight loss.  Reassess in 1 month.  11/10/22 Patient states he is doing much better on the higher dose of Effexor.  He is still having problems with lack of energy.  He is having problems with fatigue.  However his depression has improved.  He is trying to wean him self down off Klonopin.  He states that in the past he had hypogonadism.  He states that his testosterone was 70.  He used to be on testosterone pellets.  However he has not received treatment in several years. 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   Nasal turbinate hypertrophy 09/2013   OCD (obsessive compulsive disorder) 04/02/2014   Sleep apnea    Past Surgical History:  Procedure Laterality Date   APPENDECTOMY     LUMBAR LAMINECTOMY/DECOMPRESSION MICRODISCECTOMY Left 12/02/2017   Procedure: Left Lumbar 4-5 disectomy;  Surgeon: Melina Schools, MD;  Location: [place];  Service: Orthopedics;  Laterality: Left;  2 hrs   NASAL SEPTOPLASTY W/ TURBINOPLASTY  Bilateral 10/02/2013   Procedure: BILATERAL NASAL SEPTOPLASTY WITH TURBINATE REDUCTION;  Surgeon: Serena Colonel, MD;  Location: [facility];  Service: ENT;  Laterality: Bilateral;   SHOULDER ARTHROSCOPY Right 11/2013   VASECTOMY  06/02/2013   Current Outpatient Medications on File Prior to Visit  Medication Sig Dispense Refill   clonazePAM (KLONOPIN) 1 MG tablet Take 1 tablet (1 mg total) by mouth 3 (three) times daily as needed for anxiety. 90 tablet 0   EPINEPHrine 0.3 mg/0.3 mL IJ SOAJ injection Inject 0.3 mg into the muscle as needed for anaphylaxis. 1 each 1   Ginger, Zingiber officinalis, (GINGER ROOT) 500 MG CAPS Take by mouth.     MAGNESIUM PO Take by mouth.     tirzepatide (ZEPBOUND) 5 MG/0.5ML Pen Inject 5 mg into the skin once a week. 2  mL 3   venlafaxine XR (EFFEXOR XR) 75 MG 24 hr capsule Take 3 capsules (225 mg total) by mouth daily with breakfast. 90 capsule 3   No current facility-administered medications on file prior to visit.   Allergies  Allergen Reactions   Cephalexin Hives   Social History   Socioeconomic History   Marital status: Married    Spouse name: Not on file   Number of children: Not on file   Years of education: Not on file   Highest education level: Not on file  Occupational History   Not on file  Tobacco Use   Smoking status: Never   Smokeless tobacco: Never  Vaping Use   Vaping Use: Never used  Substance and Sexual Activity   Alcohol use: No    Comment: history of alcohol use in his 67s, none now    Drug use: Yes    Types: Marijuana   Sexual activity: Yes  Other Topics Concern   Not on file  Social History Narrative   Not on file   Social Determinants of Health   Financial Resource Strain: Not on file  Food Insecurity: Not on file  Transportation Needs: Not on file  Physical Activity: Not on file  Stress: Not on file  Social Connections: Not on file  Intimate Partner Violence: Not on file      Review of Systems  All other systems reviewed and are negative.      Objective:   Physical Exam Vitals reviewed.  Constitutional:      Appearance: Normal appearance. He is obese.  Cardiovascular:     Rate and Rhythm: Normal rate and regular rhythm.  Pulmonary:     Effort: Pulmonary effort is normal.     Breath sounds: Normal breath sounds.  Abdominal:     General: Bowel sounds are normal. There is no distension.     Palpations: Abdomen is soft. There is no mass.  Neurological:     Mental Status: He is alert.  Psychiatric:        Behavior: Behavior normal.        Thought Content: Thought content normal.        Judgment: Judgment normal.           Assessment & Plan:  Fatigue, unspecified type - Plan: Testosterone Total,Free,Bio, Males, Vitamin B12,  TSH Patient's depression is doing much better.  Have asked him to come back in the morning so that we can check a total testosterone, B12, and a TSH.  If his testosterone is low, I would institute testosterone replacement therapy in an effort to improve his fatigue.

## 2022-11-11 ENCOUNTER — Other Ambulatory Visit: Payer: Medicaid Other

## 2022-11-11 DIAGNOSIS — E7849 Other hyperlipidemia: Secondary | ICD-10-CM

## 2022-11-11 DIAGNOSIS — R5383 Other fatigue: Secondary | ICD-10-CM | POA: Diagnosis not present

## 2022-11-12 ENCOUNTER — Other Ambulatory Visit: Payer: Self-pay | Admitting: Family Medicine

## 2022-11-12 LAB — TSH: TSH: 2.42 mIU/L (ref 0.40–4.50)

## 2022-11-12 LAB — TESTOSTERONE TOTAL,FREE,BIO, MALES
Albumin: 4.6 g/dL (ref 3.6–5.1)
Sex Hormone Binding: 27 nmol/L (ref 10–50)
Testosterone: 197 ng/dL — ABNORMAL LOW (ref 250–827)

## 2022-11-12 LAB — VITAMIN B12: Vitamin B-12: 367 pg/mL (ref 200–1100)

## 2022-11-12 MED ORDER — TESTOSTERONE 10 MG/ACT (2%) TD GEL: 4.0000 | Freq: Every day | TRANSDERMAL | 3 refills | Status: DC

## 2022-11-20 ENCOUNTER — Telehealth: Payer: Self-pay

## 2022-11-20 DIAGNOSIS — R7989 Other specified abnormal findings of blood chemistry: Secondary | ICD-10-CM | POA: Insufficient documentation

## 2022-11-20 NOTE — Telephone Encounter (Signed)
PRIOR AUTH SENT TO COVER MY MEDS:  [place] (Key: [id number])  Your information has been sent to [employer].

## 2022-11-20 NOTE — Telephone Encounter (Signed)
PA for Testosterone 10 mg gel pump has been denied. Pt will need to try and fail 2 formulary preferred drugs, AndroGel pump or testosterone 1.62 % gel pump. Thanks.

## 2022-11-23 ENCOUNTER — Other Ambulatory Visit: Payer: Self-pay | Admitting: Family Medicine

## 2022-11-23 MED ORDER — TESTOSTERONE 20.25 MG/ACT (1.62%) TD GEL: 1.0000 | Freq: Every day | TRANSDERMAL | 5 refills | Status: DC

## 2022-12-01 DIAGNOSIS — M542 Cervicalgia: Secondary | ICD-10-CM | POA: Diagnosis not present

## 2022-12-25 ENCOUNTER — Other Ambulatory Visit: Payer: Self-pay | Admitting: Family Medicine

## 2022-12-25 MED ORDER — CLONAZEPAM 1 MG PO TABS: 1.0000 mg | ORAL_TABLET | Freq: Three times a day (TID) | ORAL | 0 refills | Status: DC | PRN

## 2023-01-05 ENCOUNTER — Telehealth: Payer: Self-pay

## 2023-01-05 NOTE — Telephone Encounter (Signed)
Outcome Approved today PA Case: [id number], Status: Approved, Coverage Starts on: 01/05/2023 12:00:00 AM, Coverage Ends on: 01/05/2024 12:00:00 AM. Authorization Expiration Date: 01/04/2024 Drug Harlene Salts 10 MG/ACT(2%) gel

## 2023-01-05 NOTE — Telephone Encounter (Signed)
PA-[place] send to plan, waiting for response from insurance

## 2023-01-06 ENCOUNTER — Telehealth: Payer: Self-pay

## 2023-01-06 NOTE — Telephone Encounter (Signed)
Robert Mcpherson (Key: [place]) PA Case ID #: [id number] Need Help? Call us at [phone] Outcome Denied on March 5 PA Case: [id number], Status: Denied. Notification: Completed. Drug Zepbound 2.'5MG'$ /0.5ML pen-injector

## 2023-01-06 NOTE — Telephone Encounter (Signed)
Fax from [employer] stating Testosterone gel pump has been approved. Pt advised via My Chart. Mjp,lpn

## 2023-01-19 ENCOUNTER — Ambulatory Visit: Payer: Medicaid Other | Admitting: Family Medicine

## 2023-01-21 ENCOUNTER — Ambulatory Visit (INDEPENDENT_AMBULATORY_CARE_PROVIDER_SITE_OTHER): Payer: Medicaid Other | Admitting: Family Medicine

## 2023-01-21 VITALS — BP 144/88 | HR 104 | Ht 69.0 in | Wt 255.0 lb

## 2023-01-21 DIAGNOSIS — F331 Major depressive disorder, recurrent, moderate: Secondary | ICD-10-CM | POA: Diagnosis not present

## 2023-01-21 MED ORDER — BREXPIPRAZOLE 1 MG PO TABS: 1.0000 mg | ORAL_TABLET | Freq: Every day | ORAL | 5 refills | Status: DC

## 2023-01-21 NOTE — Progress Notes (Signed)
Subjective:    Patient ID: Robert Mcpherson, male    DOB: 27-Oct-1976, 47 y.o.   MRN: [id number]  HPI 09/07/22 I have not seen the patient quite some time.  Since I last saw him he states that he is battling severe depression.  He is not even leaving the house.  Previously he was operating a long care business.  He estimates that he is lost more than $600,000 over the last 2 years and the majority of his savings because he will not leave to go to work.  His wife has had several "nervous breakdowns".  He is caring for his 4 children.  As result, he states that he feels severely depressed.  He hates to be around other people in crowds.  He is having trouble sleeping.  He denies any manic symptoms or suicidal ideation.  He denies any hallucinations.  However the Zoloft is no longer working for the patient.  He denies any homicidal ideation.  He states that he has lost 30 pounds unintentionally.  At that time, my plan was:  First we will stop Zoloft and replace it with Effexor extended release 75 mg p.o. every morning.  Uptitrate to 150 mg p.o. every morning in 2 weeks and then reassess in 1 month.  I will make no changes in his Klonopin dose at the present time and reassess in 1 month.  Given the unintentional weight loss I will check a CBC a CMP and an A1c as the patient has a very strong family history of diabetes.  Despite the weight loss he is interested in possibly trying Wegovy as his BMI is still 37.  10/08/22 Patient states that he sees a little bit of benefit on the Effexor.  He is currently taking 150 mg p.o. daily.  He denies any suicidal ideation.  He still has trouble getting motivated and getting out of bed having the energy to "do anything".  He also wants assistance with weight loss.  He is unable to find Wegovy in the pharmacies.  He believes that he can find the equivalent of Mounjaro for weight loss called Zepbound  01/21/23 Patient states that the Effexor has done little to help his  depression.  He states that his anxiety may be better however he still reports anhedonia and sadness and lack of energy.  He has been battling depression for years.  In his 35s he took Paxil.  He also states that he took Prozac.  However he states that he was drinking at the time and he was treating his depression with alcohol.  He stopped both of those medications as he did not see any benefit.  Has immaturity tried Zoloft.  The Zoloft helped initially however it lost its effectiveness.  Since I last saw him we started him on Effexor.  But he is seeing no benefit with regards to the depression only Effexor.  He does have a history of OCD.  He states that he is very obsessive.  When I ask him to describe more, he reports increased goal-directed behavior.  He states that when he gets something on his mind he cannot stop until he completes that.  He also has a history of impulsivity.  For instance in the past he lost more than $600,000 in the stock market.  There also may be a family history of bipolar.  He states that he called his mother on 3 separate occasions having an affair with 3 different men.  She was never  diagnosed with an underlying mental illness.  He denies any suicidal thoughts.  He denies any hallucinations or delusions.  However he is also struggling to achieve weight loss.  He is only lost 15 pounds on Zepbound. 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   Nasal turbinate hypertrophy 09/2013   OCD (obsessive compulsive disorder) 04/02/2014   Sleep apnea    Past Surgical History:  Procedure Laterality Date   APPENDECTOMY     LUMBAR LAMINECTOMY/DECOMPRESSION MICRODISCECTOMY Left 12/02/2017   Procedure: Left Lumbar 4-5 disectomy;  Surgeon: Melina Schools, MD;  Location: [place];  Service: Orthopedics;  Laterality: Left;  2 hrs   NASAL SEPTOPLASTY W/  TURBINOPLASTY Bilateral 10/02/2013   Procedure: BILATERAL NASAL SEPTOPLASTY WITH TURBINATE REDUCTION;  Surgeon: Izora Gala, MD;  Location: [place];  Service: ENT;  Laterality: Bilateral;   SHOULDER ARTHROSCOPY Right 11/2013   VASECTOMY  06/02/2013   Current Outpatient Medications on File Prior to Visit  Medication Sig Dispense Refill   Testosterone 20.25 MG/ACT (1.62%) GEL Place 1 Pump onto the skin daily. 75 g 5   clonazePAM (KLONOPIN) 1 MG tablet Take 1 tablet (1 mg total) by mouth 3 (three) times daily as needed for anxiety. 90 tablet 0   EPINEPHrine 0.3 mg/0.3 mL IJ SOAJ injection Inject 0.3 mg into the muscle as needed for anaphylaxis. 1 each 1   Ginger, Zingiber officinalis, (GINGER ROOT) 500 MG CAPS Take by mouth.     MAGNESIUM PO Take by mouth.     tirzepatide (ZEPBOUND) 5 MG/0.5ML Pen Inject 5 mg into the skin once a week. 2 mL 3   venlafaxine XR (EFFEXOR XR) 75 MG 24 hr capsule Take 3 capsules (225 mg total) by mouth daily with breakfast. 90 capsule 3   No current facility-administered medications on file prior to visit.   Allergies  Allergen Reactions   Cephalexin Hives   Social History   Socioeconomic History   Marital status: Married    Spouse name: Not on file   Number of children: Not on file   Years of education: Not on file   Highest education level: Bachelor's degree (e.g., BA, AB, BS)  Occupational History   Not on file  Tobacco Use   Smoking status: Never   Smokeless tobacco: Never  Vaping Use   Vaping Use: Never used  Substance and Sexual Activity   Alcohol use: No    Comment: history of alcohol use in his 28s, none now    Drug use: Yes    Types: Marijuana   Sexual activity: Yes  Other Topics Concern   Not on file  Social History Narrative   Not on file   Social Determinants of Health   Financial Resource Strain: Medium Risk (01/21/2023)   Overall Financial Resource Strain (CARDIA)    Difficulty of Paying Living Expenses: Somewhat  hard  Food Insecurity: Food Insecurity Present (01/21/2023)   Hunger Vital Sign    Worried About Running Out of Food in the Last Year: Sometimes true    Ran Out of Food in the Last Year: Sometimes true  Transportation Needs: No Transportation Needs (01/21/2023)   PRAPARE - Hydrologist (Medical): No    Lack of Transportation (Non-Medical): No  Physical Activity: Insufficiently Active (01/21/2023)  Exercise Vital Sign    Days of Exercise per Week: 1 day    Minutes of Exercise per Session: 20 min  Stress: Stress Concern Present (01/21/2023)   [place]    Feeling of Stress : Very much  Social Connections: Socially Isolated (01/21/2023)   Social Connection and Isolation Panel [NHANES]    Frequency of Communication with Friends and Family: Twice a week    Frequency of Social Gatherings with Friends and Family: Never    Attends Religious Services: Never    Marine scientist or Organizations: No    Attends Music therapist: Not on file    Marital Status: Married  Human resources officer Violence: Not on file      Review of Systems  All other systems reviewed and are negative.      Objective:   Physical Exam Vitals reviewed.  Constitutional:      Appearance: Normal appearance. He is obese.  Cardiovascular:     Rate and Rhythm: Normal rate and regular rhythm.  Pulmonary:     Effort: Pulmonary effort is normal.     Breath sounds: Normal breath sounds.  Abdominal:     General: Bowel sounds are normal. There is no distension.     Palpations: Abdomen is soft. There is no mass.  Neurological:     Mental Status: He is alert.  Psychiatric:        Behavior: Behavior normal.        Thought Content: Thought content normal.        Judgment: Judgment normal.           Assessment & Plan:   MDD (major depressive disorder), recurrent episode, moderate (HCC) I am concerned that  the patient may have some bipolar tendencies given his impulsivity, his poor financial decisions, his family history, his obsessiveness.  He is not doing well on Effexor.  Therefore I recommended that we try adding Rexulti.  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  I also believe it would be beneficial because its weight neutral unlike Zyprexa or Abilify.  Reassess in 1 month

## 2023-01-28 ENCOUNTER — Telehealth: Payer: Self-pay

## 2023-01-28 NOTE — Telephone Encounter (Signed)
Rec' PA form from [facility], form put on PCP's desk for signature.   Will fax back once completed per PCP

## 2023-01-31 ENCOUNTER — Other Ambulatory Visit: Payer: Self-pay | Admitting: Family Medicine

## 2023-02-01 ENCOUNTER — Other Ambulatory Visit: Payer: Self-pay | Admitting: Family Medicine

## 2023-02-01 MED ORDER — CLONAZEPAM 1 MG PO TABS: 1.0000 mg | ORAL_TABLET | Freq: Three times a day (TID) | ORAL | 0 refills | Status: DC | PRN

## 2023-02-02 ENCOUNTER — Ambulatory Visit: Payer: Medicaid Other | Admitting: Family Medicine

## 2023-02-02 ENCOUNTER — Telehealth: Payer: Self-pay | Admitting: Adult Health

## 2023-02-02 VITALS — BP 144/110 | HR 107 | Ht 69.0 in | Wt 254.0 lb

## 2023-02-02 DIAGNOSIS — F322 Major depressive disorder, single episode, severe without psychotic features: Secondary | ICD-10-CM

## 2023-02-02 MED ORDER — ARIPIPRAZOLE 5 MG PO TABS: 5.0000 mg | ORAL_TABLET | Freq: Every day | ORAL | 3 refills | Status: DC

## 2023-02-02 MED ORDER — CLONAZEPAM 1 MG PO TABS: 1.0000 mg | ORAL_TABLET | Freq: Three times a day (TID) | ORAL | 0 refills | Status: DC | PRN

## 2023-02-02 NOTE — Telephone Encounter (Signed)
LVM and sent mychart msg informing pt of need to reschedule 02/08/23 appointment - NP out

## 2023-02-02 NOTE — Progress Notes (Signed)
Subjective:    Patient ID: Robert Mcpherson, male    DOB: 03/29/1976, 47 y.o.   MRN: [id number]  Depression        09/07/22 I have not seen the patient quite some time.  Since I last saw him he states that he is battling severe depression.  He is not even leaving the house.  Previously he was operating a long care business.  He estimates that he is lost more than $600,000 over the last 2 years and the majority of his savings because he will not leave to go to work.  His wife has had several "nervous breakdowns".  He is caring for his 4 children.  As result, he states that he feels severely depressed.  He hates to be around other people in crowds.  He is having trouble sleeping.  He denies any manic symptoms or suicidal ideation.  He denies any hallucinations.  However the Zoloft is no longer working for the patient.  He denies any homicidal ideation.  He states that he has lost 30 pounds unintentionally.  At that time, my plan was:  First we will stop Zoloft and replace it with Effexor extended release 75 mg p.o. every morning.  Uptitrate to 150 mg p.o. every morning in 2 weeks and then reassess in 1 month.  I will make no changes in his Klonopin dose at the present time and reassess in 1 month.  Given the unintentional weight loss I will check a CBC a CMP and an A1c as the patient has a very strong family history of diabetes.  Despite the weight loss he is interested in possibly trying Wegovy as his BMI is still 37.  10/08/22 Patient states that he sees a little bit of benefit on the Effexor.  He is currently taking 150 mg p.o. daily.  He denies any suicidal ideation.  He still has trouble getting motivated and getting out of bed having the energy to "do anything".  He also wants assistance with weight loss.  He is unable to find Wegovy in the pharmacies.  He believes that he can find the equivalent of Mounjaro for weight loss called Zepbound  01/21/23 Patient states that the Effexor has done little to  help his depression.  He states that his anxiety may be better however he still reports anhedonia and sadness and lack of energy.  He has been battling depression for years.  In his 44s he took Paxil.  He also states that he took Prozac.  However he states that he was drinking at the time and he was treating his depression with alcohol.  He stopped both of those medications as he did not see any benefit.  Has immaturity tried Zoloft.  The Zoloft helped initially however it lost its effectiveness.  Since I last saw him we started him on Effexor.  But he is seeing no benefit with regards to the depression only Effexor.  He does have a history of OCD.  He states that he is very obsessive.  When I ask him to describe more, he reports increased goal-directed behavior.  He states that when he gets something on his mind he cannot stop until he completes that.  He also has a history of impulsivity.  For instance in the past he lost more than $600,000 in the stock market.  There also may be a family history of bipolar.  He states that he called his mother on 3 separate occasions having an affair with  3 different men.  She was never diagnosed with an underlying mental illness.  He denies any suicidal thoughts.  He denies any hallucinations or delusions.  However he is also struggling to achieve weight loss.  He is only lost 15 pounds on Zepbound.  At that time, my plan was: I am concerned that the patient may have some bipolar tendencies given his impulsivity, his poor financial decisions, his family history, his obsessiveness.  He is not doing well on Effexor.  Therefore I recommended that we try adding Rexulti.  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  I also believe it would be beneficial because its weight neutral unlike Zyprexa or Abilify.  Reassess in 1 month  02/02/23 Patient reached out today stating that he has been having suicidal thoughts lately.   He was unable to start the [place].  His insurance would not cover it.  Therefore he is still on Effexor alone.  He reports thinking that he would be better off if he was not here.  He hates being in crowds.  He is always feeling anxious.  He denies coming up with any plans.  He states that he has no intention to kill himself.  He wants to be here for his children.  He states that he reached out because he was starting to have those thoughts and he wants to change his treatment regimen to try to improve.  We discussed going to the hospital for inpatient therapy.  Patient declines this.  He does not feel that he is a threat to himself or anyone else.  He is not hallucinating.  He is not having any delusions.  He reports feeling sad and depressed all the time.  He reports anhedonia.  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   Nasal turbinate hypertrophy 09/2013   OCD (obsessive compulsive disorder) 04/02/2014   Sleep apnea    Past Surgical History:  Procedure Laterality Date   APPENDECTOMY     LUMBAR LAMINECTOMY/DECOMPRESSION MICRODISCECTOMY Left 12/02/2017   Procedure: Left Lumbar 4-5 disectomy;  Surgeon: Melina Schools, MD;  Location: [place];  Service: Orthopedics;  Laterality: Left;  2 hrs   NASAL SEPTOPLASTY W/ TURBINOPLASTY Bilateral 10/02/2013   Procedure: BILATERAL NASAL SEPTOPLASTY WITH TURBINATE REDUCTION;  Surgeon: Izora Gala, MD;  Location: [place];  Service: ENT;  Laterality: Bilateral;   SHOULDER ARTHROSCOPY Right 11/2013   VASECTOMY  06/02/2013   Current Outpatient Medications on File Prior to Visit  Medication Sig Dispense Refill   brexpiprazole (REXULTI) 1 MG TABS tablet Take 1 tablet (1 mg total) by mouth daily. 30 tablet 5   EPINEPHrine 0.3 mg/0.3 mL IJ SOAJ injection Inject 0.3 mg into the muscle as  needed for anaphylaxis. 1 each 1   Ginger, Zingiber officinalis, (GINGER ROOT) 500 MG CAPS Take by mouth.     MAGNESIUM PO Take by mouth.     Testosterone 20.25 MG/ACT (1.62%) GEL Place 1 Pump onto the skin daily. 75 g 5   venlafaxine XR (EFFEXOR XR) 75 MG 24 hr capsule Take 3 capsules (225 mg total) by mouth daily with breakfast. 90 capsule 3   No current facility-administered medications on file prior to visit.  Allergies  Allergen Reactions   Cephalexin Hives   Social History   Socioeconomic History   Marital status: Married    Spouse name: Not on file   Number of children: Not on file   Years of education: Not on file   Highest education level: Bachelor's degree (e.g., BA, AB, BS)  Occupational History   Not on file  Tobacco Use   Smoking status: Never   Smokeless tobacco: Never  Vaping Use   Vaping Use: Never used  Substance and Sexual Activity   Alcohol use: No    Comment: history of alcohol use in his 65s, none now    Drug use: Yes    Types: Marijuana   Sexual activity: Yes  Other Topics Concern   Not on file  Social History Narrative   Not on file   Social Determinants of Health   Financial Resource Strain: Medium Risk (01/21/2023)   Overall Financial Resource Strain (CARDIA)    Difficulty of Paying Living Expenses: Somewhat hard  Food Insecurity: Food Insecurity Present (01/21/2023)   Hunger Vital Sign    Worried About Running Out of Food in the Last Year: Sometimes true    Ran Out of Food in the Last Year: Sometimes true  Transportation Needs: No Transportation Needs (01/21/2023)   PRAPARE - Hydrologist (Medical): No    Lack of Transportation (Non-Medical): No  Physical Activity: Insufficiently Active (01/21/2023)   Exercise Vital Sign    Days of Exercise per Week: 1 day    Minutes of Exercise per Session: 20 min  Stress: Stress Concern Present (01/21/2023)   [place]    Feeling of Stress : Very much  Social Connections: Socially Isolated (01/21/2023)   Social Connection and Isolation Panel [NHANES]    Frequency of Communication with Friends and Family: Twice a week    Frequency of Social Gatherings with Friends and Family: Never    Attends Religious Services: Never    Marine scientist or Organizations: No    Attends Music therapist: Not on file    Marital Status: Married  Human resources officer Violence: Not on file      Review of Systems  Psychiatric/Behavioral:  Positive for depression.   All other systems reviewed and are negative.      Objective:   Physical Exam Vitals reviewed.  Constitutional:      Appearance: Normal appearance. He is obese.  Cardiovascular:     Rate and Rhythm: Normal rate and regular rhythm.  Pulmonary:     Effort: Pulmonary effort is normal.     Breath sounds: Normal breath sounds.  Abdominal:     General: Bowel sounds are normal. There is no distension.     Palpations: Abdomen is soft. There is no mass.  Neurological:     Mental Status: He is alert.  Psychiatric:        Behavior: Behavior normal.        Thought Content: Thought content normal.        Judgment: Judgment normal.           Assessment & Plan:  Current severe episode of major depressive disorder without psychotic features, unspecified whether recurrent Patient promised me that he has no intention to commit suicide.  He has no plans.  He just wants help.  We discussed inpatient therapy and he declines this.  However we agreed to start Abilify 5 mg  daily and then recheck in 1 week or sooner if worse.  Continue to use Effexor as well.  I did refill his Klonopin that he is using for anxiety as I feel that the fact he has been out of the medication for a few days may be exacerbating his anxiety triggering a downward spiral

## 2023-02-04 ENCOUNTER — Telehealth: Payer: Self-pay | Admitting: Family Medicine

## 2023-02-04 NOTE — Telephone Encounter (Signed)
error 

## 2023-02-08 ENCOUNTER — Ambulatory Visit: Payer: 59 | Admitting: Adult Health

## 2023-02-23 ENCOUNTER — Other Ambulatory Visit: Payer: Self-pay | Admitting: Family Medicine

## 2023-02-23 NOTE — Telephone Encounter (Signed)
Requested medication (s) are due for refill today: yes  Requested medication (s) are on the active medication list: yes    Last refill: 10/08/22  #90  3 refills  Future visit scheduled no  Notes to clinic:Failed due to labs, please review. Thank you.  Requested Prescriptions  Pending Prescriptions Disp Refills   venlafaxine XR (EFFEXOR-XR) 75 MG 24 hr capsule [Pharmacy Med Name: Venlafaxine HCl ER 75 MG Oral Capsule Extended Release 24 Hour] 180 capsule 0    Sig: TAKE 3 CAPSULES BY MOUTH ONCE DAILY WITH BREAKFAST     Psychiatry: Antidepressants - SNRI - desvenlafaxine & venlafaxine Failed - 02/23/2023 10:43 AM      Failed - Last BP in normal range    BP Readings from Last 1 Encounters:  02/02/23 (!) 144/110         Failed - Valid encounter within last 6 months    Recent Outpatient Visits           1 year ago Viral upper respiratory tract infection   [facility] Valentino Nose, NP   1 year ago Sleep apnea, unspecified type   [facility] Pickard, Priscille Heidelberg, MD   1 year ago General medical exam   [facility] Donita Brooks, MD   1 year ago Other microscopic hematuria   [facility] Valentino Nose, NP   2 years ago Rash in adult   [facility] Pickard, Priscille Heidelberg, MD              Failed - Lipid Panel in normal range within the last 12 months    Cholesterol  Date Value Ref Range Status  06/23/2021 238 (H) <200 mg/dL Final   LDL Cholesterol (Calc)  Date Value Ref Range Status  06/23/2021 164 (H) mg/dL (calc) Final    Comment:    Reference range: <100 . Desirable range <100 mg/dL for primary prevention;   <70 mg/dL for patients with CHD or diabetic patients  with > or = 2 CHD risk factors. Marland Kitchen LDL-C is now calculated using the Martin-Hopkins  calculation, which is a validated novel method providing  better accuracy than the Friedewald equation in the  estimation of LDL-C.   Horald Pollen et al. Lenox Ahr. 4034;742(59): 2061-2068  (http://education.Quest[facility].com/faq/FAQ164)    HDL  Date Value Ref Range Status  06/23/2021 37 (L) > OR = 40 mg/dL Final   Triglycerides  Date Value Ref Range Status  06/23/2021 207 (H) <150 mg/dL Final    Comment:    . If a non-fasting specimen was collected, consider repeat triglyceride testing on a fasting specimen if clinically indicated.  Perry Mount et al. J. of Clin. Lipidol. 2015;9:129-169. Marland Kitchen          Passed - Cr in normal range and within 360 days    Creat  Date Value Ref Range Status  09/07/2022 0.94 0.60 - 1.29 mg/dL Final

## 2023-03-03 ENCOUNTER — Other Ambulatory Visit: Payer: Medicaid Other

## 2023-03-03 ENCOUNTER — Telehealth: Payer: Self-pay

## 2023-03-03 DIAGNOSIS — E7849 Other hyperlipidemia: Secondary | ICD-10-CM | POA: Diagnosis not present

## 2023-03-03 DIAGNOSIS — R634 Abnormal weight loss: Secondary | ICD-10-CM

## 2023-03-03 DIAGNOSIS — G4733 Obstructive sleep apnea (adult) (pediatric): Secondary | ICD-10-CM | POA: Diagnosis not present

## 2023-03-03 DIAGNOSIS — R7989 Other specified abnormal findings of blood chemistry: Secondary | ICD-10-CM

## 2023-03-03 DIAGNOSIS — R5383 Other fatigue: Secondary | ICD-10-CM | POA: Diagnosis not present

## 2023-03-03 DIAGNOSIS — F332 Major depressive disorder, recurrent severe without psychotic features: Secondary | ICD-10-CM | POA: Insufficient documentation

## 2023-03-03 NOTE — Telephone Encounter (Signed)
Robert Mcpherson (Key: [id number])   This request has received a Favorable outcome.  Please note any additional information provided by [employer] [facility] at the bottom of this request.

## 2023-03-07 LAB — COMPLETE METABOLIC PANEL WITH GFR
AG Ratio: 2 (calc) (ref 1.0–2.5)
ALT: 43 U/L (ref 9–46)
AST: 24 U/L (ref 10–40)
Albumin: 4.7 g/dL (ref 3.6–5.1)
Alkaline phosphatase (APISO): 50 U/L (ref 36–130)
BUN: 14 mg/dL (ref 7–25)
CO2: 29 mmol/L (ref 20–32)
Calcium: 9.5 mg/dL (ref 8.6–10.3)
Chloride: 103 mmol/L (ref 98–110)
Creat: 1.13 mg/dL (ref 0.60–1.29)
Globulin: 2.4 g/dL (calc) (ref 1.9–3.7)
Glucose, Bld: 81 mg/dL (ref 65–99)
Potassium: 4.3 mmol/L (ref 3.5–5.3)
Sodium: 141 mmol/L (ref 135–146)
Total Bilirubin: 0.3 mg/dL (ref 0.2–1.2)
Total Protein: 7.1 g/dL (ref 6.1–8.1)
eGFR: 82 mL/min/{1.73_m2} (ref >=60)

## 2023-03-07 LAB — CBC WITH DIFFERENTIAL/PLATELET
Absolute Monocytes: 547 cells/uL (ref 200–950)
Basophils Absolute: 62 cells/uL (ref 0–200)
Basophils Relative: 0.8 %
Eosinophils Absolute: 177 cells/uL (ref 15–500)
Eosinophils Relative: 2.3 %
HCT: 51.8 % — ABNORMAL HIGH (ref 38.5–50.0)
Hemoglobin: 17.4 g/dL — ABNORMAL HIGH (ref 13.2–17.1)
Lymphs Abs: 2718 cells/uL (ref 850–3900)
MCH: 29.1 pg (ref 27.0–33.0)
MCHC: 33.6 g/dL (ref 32.0–36.0)
MCV: 86.6 fL (ref 80.0–100.0)
MPV: 10 fL (ref 7.5–12.5)
Monocytes Relative: 7.1 %
Neutro Abs: 4197 cells/uL (ref 1500–7800)
Neutrophils Relative %: 54.5 %
Platelets: 291 10*3/uL (ref 140–400)
RBC: 5.98 10*6/uL — ABNORMAL HIGH (ref 4.20–5.80)
RDW: 13.4 % (ref 11.0–15.0)
Total Lymphocyte: 35.3 %
WBC: 7.7 10*3/uL (ref 3.8–10.8)

## 2023-03-07 LAB — LIPID PANEL
Cholesterol: 204 mg/dL — ABNORMAL HIGH (ref <200)
HDL: 42 mg/dL (ref >=40)
LDL Cholesterol (Calc): 128 mg/dL (calc) — ABNORMAL HIGH
Non-HDL Cholesterol (Calc): 162 mg/dL (calc) — ABNORMAL HIGH (ref <130)
Total CHOL/HDL Ratio: 4.9 (calc) (ref <5.0)
Triglycerides: 198 mg/dL — ABNORMAL HIGH (ref <150)

## 2023-03-07 LAB — TESTOSTERONE, FREE & TOTAL
Free Testosterone: 69 pg/mL (ref 35.0–155.0)
Testosterone, Total, LC-MS-MS: 405 ng/dL (ref 250–1100)

## 2023-03-17 ENCOUNTER — Other Ambulatory Visit: Payer: Self-pay | Admitting: Family Medicine

## 2023-03-17 NOTE — Telephone Encounter (Signed)
Requested medication (s) are due for refill today: routing for review  Requested medication (s) are on the active medication list: yes  Last refill:  02/02/23  Future visit scheduled: no  Notes to clinic:  Unable to refill per protocol, cannot delegate.      Requested Prescriptions  Pending Prescriptions Disp Refills   clonazePAM (KLONOPIN) 1 MG tablet [Pharmacy Med Name: clonazePAM 1 MG Oral Tablet] 90 tablet 0    Sig: TAKE 1 TABLET BY MOUTH THREE TIMES DAILY AS NEEDED FOR ANXIETY     Not Delegated - Psychiatry: Anxiolytics/Hypnotics 2 Failed - 03/17/2023  1:02 AM      Failed - This refill cannot be delegated      Failed - Urine Drug Screen completed in last 360 days      Failed - Valid encounter within last 6 months    Recent Outpatient Visits           1 year ago Viral upper respiratory tract infection   [facility] Valentino Nose, NP   1 year ago Sleep apnea, unspecified type   [facility] Pickard, Priscille Heidelberg, MD   1 year ago General medical exam   [facility] Donita Brooks, MD   1 year ago Other microscopic hematuria   [facility] Valentino Nose, NP   2 years ago Rash in adult   [facility] Pickard, Priscille Heidelberg, MD              Passed - Patient is not pregnant

## 2023-03-25 ENCOUNTER — Other Ambulatory Visit: Payer: Self-pay | Admitting: Family Medicine

## 2023-03-25 MED ORDER — SERTRALINE HCL 50 MG PO TABS
100.0000 mg | ORAL_TABLET | Freq: Every day | ORAL | 3 refills | Status: DC
Start: 2023-03-25 — End: 2023-03-30

## 2023-03-30 ENCOUNTER — Other Ambulatory Visit: Payer: Self-pay | Admitting: Family Medicine

## 2023-03-30 MED ORDER — SERTRALINE HCL 50 MG PO TABS: 100.0000 mg | ORAL_TABLET | Freq: Every day | ORAL | 3 refills | Status: DC

## 2023-04-02 ENCOUNTER — Telehealth: Payer: Self-pay | Admitting: Family Medicine

## 2023-04-02 NOTE — Telephone Encounter (Signed)
Prescription Request  04/02/2023  LOV: 02/02/2023  What is the name of the medication or equipment?   sertraline (ZOLOFT) 50 MG tablet  **Drug change request,. Drug not covered by patient's plan. The preferred alternatives are ESCITALOPRAMTABMG, FLUOXETINECAPMG, CITALOPRAMTABMG,PAROXETINETABMG.**   Have you contacted your pharmacy to request a refill? Yes   Which pharmacy would you like this sent to?  [facility] [id number] - [place], [place] - [address] AT [facility] [address] [place] [address] Phone: [phone] Fax: [phone]    Patient notified that their request is being sent to the clinical staff for review and that they should receive a response within 2 business days.   Please advise pharmacist.

## 2023-04-26 ENCOUNTER — Other Ambulatory Visit: Payer: Self-pay | Admitting: Family Medicine

## 2023-04-27 ENCOUNTER — Telehealth: Payer: Self-pay

## 2023-04-27 ENCOUNTER — Other Ambulatory Visit: Payer: Self-pay

## 2023-04-27 NOTE — Telephone Encounter (Signed)
SERTALINE 50 MG PRIOR AUTHAbdulhamid Olgin (Key: [id number]) Outcome  The member recently filled this medication and will be able to return for their next refill according to their plan limits.\  Drug Sertraline HCl 50MG  tablets  [employer] [facility] Electronic [place] Form [phone] [facility]

## 2023-06-04 ENCOUNTER — Other Ambulatory Visit: Payer: Self-pay | Admitting: Family Medicine

## 2023-06-04 NOTE — Telephone Encounter (Signed)
Requested medication (s) are due for refill today: Yes  Requested medication (s) are on the active medication list: Yes Ability is on current list, the other is not   Last refill:  02/02/23  Future visit scheduled: No  Notes to clinic:  Unable to refill per protocol, cannot delegate. The other medication was D/C by provider      Requested Prescriptions  Pending Prescriptions Disp Refills   ARIPiprazole (ABILIFY) 5 MG tablet [Pharmacy Med Name: ARIPiprazole 5 MG Oral Tablet] 30 tablet 0    Sig: Take 1 tablet by mouth once daily     Not Delegated - Psychiatry:  Antipsychotics - Second Generation (Atypical) - aripiprazole Failed - 06/04/2023 12:31 PM      Failed - This refill cannot be delegated      Failed - Last BP in normal range    BP Readings from Last 1 Encounters:  02/02/23 (!) 144/110         Failed - Valid encounter within last 6 months    Recent Outpatient Visits           1 year ago Viral upper respiratory tract infection   [facility] Valentino Nose, NP   1 year ago Sleep apnea, unspecified type   [facility] Pickard, Priscille Heidelberg, MD   1 year ago General medical exam   [facility] Donita Brooks, MD   2 years ago Other microscopic hematuria   [facility] Valentino Nose, NP   2 years ago Rash in adult   [facility] Pickard, Priscille Heidelberg, MD              Failed - Lipid Panel in normal range within the last 12 months    Cholesterol  Date Value Ref Range Status  03/03/2023 204 (H) <200 mg/dL Final   LDL Cholesterol (Calc)  Date Value Ref Range Status  03/03/2023 128 (H) mg/dL (calc) Final    Comment:    Reference range: <100 . Desirable range <100 mg/dL for primary prevention;   <70 mg/dL for patients with CHD or diabetic patients  with > or = 2 CHD risk factors. Marland Kitchen LDL-C is now calculated using the Martin-Hopkins  calculation, which is a validated novel method  providing  better accuracy than the Friedewald equation in the  estimation of LDL-C.  Horald Pollen et al. Lenox Ahr. 1610;960(45): 2061-2068  (http://education.[facility].com/faq/FAQ164)    HDL  Date Value Ref Range Status  03/03/2023 42 > OR = 40 mg/dL Final   Triglycerides  Date Value Ref Range Status  03/03/2023 198 (H) <150 mg/dL Final         Failed - CMP within normal limits and completed in the last 12 months    Albumin  Date Value Ref Range Status  04/25/2021 5.0 3.5 - 5.0 g/dL Final   Alkaline Phosphatase  Date Value Ref Range Status  04/25/2021 53 38 - 126 U/L Final   Alkaline phosphatase (APISO)  Date Value Ref Range Status  03/03/2023 50 36 - 130 U/L Final   ALT  Date Value Ref Range Status  03/03/2023 43 9 - 46 U/L Final   AST  Date Value Ref Range Status  03/03/2023 24 10 - 40 U/L Final   BUN  Date Value Ref Range Status  03/03/2023 14 7 - 25 mg/dL Final   Calcium  Date Value Ref Range Status  03/03/2023 9.5 8.6 - 10.3 mg/dL Final  CO2  Date Value Ref Range Status  03/03/2023 29 20 - 32 mmol/L Final   Creat  Date Value Ref Range Status  03/03/2023 1.13 0.60 - 1.29 mg/dL Final   Glucose, Bld  Date Value Ref Range Status  03/03/2023 81 65 - 99 mg/dL Final    Comment:    .            Fasting reference interval .    Potassium  Date Value Ref Range Status  03/03/2023 4.3 3.5 - 5.3 mmol/L Final   Sodium  Date Value Ref Range Status  03/03/2023 141 135 - 146 mmol/L Final   Total Bilirubin  Date Value Ref Range Status  03/03/2023 0.3 0.2 - 1.2 mg/dL Final   Protein, ur  Date Value Ref Range Status  04/25/2021 30 (A) NEGATIVE mg/dL Final   Total Protein  Date Value Ref Range Status  03/03/2023 7.1 6.1 - 8.1 g/dL Final   GFR, Est African American  Date Value Ref Range Status  08/25/2016 >89 >=60 mL/min Final   GFR calc Af Amer  Date Value Ref Range Status  11/29/2017 >60 >60 mL/min Final    Comment:    (NOTE) The eGFR has  been calculated using the CKD EPI equation. This calculation has not been validated in all clinical situations. eGFR's persistently <60 mL/min signify possible Chronic Kidney Disease.    eGFR  Date Value Ref Range Status  03/03/2023 82 > OR = 60 mL/min/1.63m2 Final   GFR, Est Non African American  Date Value Ref Range Status  08/25/2016 87 >=60 mL/min Final   GFR, Estimated  Date Value Ref Range Status  04/25/2021 >60 >60 mL/min Final    Comment:    (NOTE) Calculated using the CKD-EPI Creatinine Equation (2021)          Passed - TSH in normal range and within 360 days    TSH  Date Value Ref Range Status  11/11/2022 2.42 0.40 - 4.50 mIU/L Final         Passed - Completed PHQ-2 or PHQ-9 in the last 360 days      Passed - Last Heart Rate in normal range    Pulse Readings from Last 1 Encounters:  02/02/23 (!) 107         Passed - CBC within normal limits and completed in the last 12 months    WBC  Date Value Ref Range Status  03/03/2023 7.7 3.8 - 10.8 Thousand/uL Final   RBC  Date Value Ref Range Status  03/03/2023 5.98 (H) 4.20 - 5.80 Million/uL Final   Hemoglobin  Date Value Ref Range Status  03/03/2023 17.4 (H) 13.2 - 17.1 g/dL Final   HCT  Date Value Ref Range Status  03/03/2023 51.8 (H) 38.5 - 50.0 % Final   MCHC  Date Value Ref Range Status  03/03/2023 33.6 32.0 - 36.0 g/dL Final   [facility]  Date Value Ref Range Status  03/03/2023 29.1 27.0 - 33.0 pg Final   MCV  Date Value Ref Range Status  03/03/2023 86.6 80.0 - 100.0 fL Final  10/13/2014 92.3 80 - 97 fL Final   Platelet Count, POC  Date Value Ref Range Status  10/13/2014 238 142 - 424 K/uL Final   RDW  Date Value Ref Range Status  03/03/2023 13.4 11.0 - 15.0 % Final   RDW, POC  Date Value Ref Range Status  10/13/2014 13.8 % Final          venlafaxine XR (  EFFEXOR-XR) 75 MG 24 hr capsule [Pharmacy Med Name: Venlafaxine HCl ER 75 MG Oral Capsule Extended Release 24 Hour] 180 capsule 0     Sig: TAKE 3 CAPSULES BY MOUTH ONCE DAILY WITH BREAKFAST     Psychiatry: Antidepressants - SNRI - desvenlafaxine & venlafaxine Failed - 06/04/2023 12:31 PM      Failed - Last BP in normal range    BP Readings from Last 1 Encounters:  02/02/23 (!) 144/110         Failed - Valid encounter within last 6 months    Recent Outpatient Visits           1 year ago Viral upper respiratory tract infection   [facility] Valentino Nose, NP   1 year ago Sleep apnea, unspecified type   [facility] Pickard, Priscille Heidelberg, MD   1 year ago General medical exam   [facility] Donita Brooks, MD   2 years ago Other microscopic hematuria   [facility] Valentino Nose, NP   2 years ago Rash in adult   [facility] Pickard, Priscille Heidelberg, MD              Failed - Lipid Panel in normal range within the last 12 months    Cholesterol  Date Value Ref Range Status  03/03/2023 204 (H) <200 mg/dL Final   LDL Cholesterol (Calc)  Date Value Ref Range Status  03/03/2023 128 (H) mg/dL (calc) Final    Comment:    Reference range: <100 . Desirable range <100 mg/dL for primary prevention;   <70 mg/dL for patients with CHD or diabetic patients  with > or = 2 CHD risk factors. Marland Kitchen LDL-C is now calculated using the Martin-Hopkins  calculation, which is a validated novel method providing  better accuracy than the Friedewald equation in the  estimation of LDL-C.  Horald Pollen et al. Lenox Ahr. 4098;119(14): 2061-2068  (http://education.[facility].com/faq/FAQ164)    HDL  Date Value Ref Range Status  03/03/2023 42 > OR = 40 mg/dL Final   Triglycerides  Date Value Ref Range Status  03/03/2023 198 (H) <150 mg/dL Final         Passed - Cr in normal range and within 360 days    Creat  Date Value Ref Range Status  03/03/2023 1.13 0.60 - 1.29 mg/dL Final         Passed - Completed PHQ-2 or PHQ-9 in the last 360 days

## 2023-06-16 DIAGNOSIS — J351 Hypertrophy of tonsils: Secondary | ICD-10-CM | POA: Diagnosis not present

## 2023-06-16 DIAGNOSIS — Z6838 Body mass index (BMI) 38.0-38.9, adult: Secondary | ICD-10-CM | POA: Diagnosis not present

## 2023-07-18 ENCOUNTER — Other Ambulatory Visit: Payer: Self-pay | Admitting: Family Medicine

## 2023-07-20 ENCOUNTER — Other Ambulatory Visit: Payer: Self-pay | Admitting: Family Medicine

## 2023-07-20 NOTE — Telephone Encounter (Signed)
Requested medication (s) are due for refill today:   Provider to review  Requested medication (s) are on the active medication list:   Yes  Future visit scheduled:   No   LOV 02/02/2023   Last ordered: 11/23/2022 75 g, 5 refills  Returned because there is not a protocol assigned to this medication.      Requested Prescriptions  Pending Prescriptions Disp Refills   Testosterone 1.62 % GEL [Pharmacy Med Name: TESTOSTERONE 1.62% GEL (60 PUMPS)] 75 g     Sig: PLACE 1 PUMP ONTO SKIN DAILY.     Off-Protocol Failed - 07/18/2023 12:26 PM      Failed - Medication not assigned to a protocol, review manually.      Failed - Valid encounter within last 12 months    Recent Outpatient Visits           1 year ago Viral upper respiratory tract infection   [facility] Valentino Nose, NP   1 year ago Sleep apnea, unspecified type   [facility] Pickard, Priscille Heidelberg, MD   2 years ago General medical exam   [facility] Donita Brooks, MD   2 years ago Other microscopic hematuria   [facility] Valentino Nose, NP   2 years ago Rash in adult   [facility] Pickard, Priscille Heidelberg, MD

## 2023-07-20 NOTE — Telephone Encounter (Signed)
Patient's wife called to follow up on refill request.   Pharmacy confirmed as:  [phone] - [place], [place] - [address] AT [facility] [address],  [place] [address] Phone: [phone]  Fax: [phone] DEA #: [id number]   Please advise at [phone]

## 2023-07-21 MED ORDER — CLONAZEPAM 1 MG PO TABS: 1.0000 mg | ORAL_TABLET | Freq: Three times a day (TID) | ORAL | 0 refills | Status: DC | PRN

## 2023-07-21 NOTE — Telephone Encounter (Signed)
Amber, is there any way you can refill this patient's medication today? He is Dr. Caren Macadam patient and he is out of medication. Thank you!

## 2023-08-02 ENCOUNTER — Encounter: Payer: Self-pay | Admitting: Family Medicine

## 2023-08-02 ENCOUNTER — Ambulatory Visit: Payer: Medicaid Other | Admitting: Family Medicine

## 2023-08-02 VITALS — BP 136/82 | HR 78 | Temp 98.9°F | Ht 69.0 in | Wt 262.2 lb

## 2023-08-02 DIAGNOSIS — E291 Testicular hypofunction: Secondary | ICD-10-CM

## 2023-08-02 DIAGNOSIS — E7800 Pure hypercholesterolemia, unspecified: Secondary | ICD-10-CM | POA: Diagnosis not present

## 2023-08-02 NOTE — Progress Notes (Signed)
Subjective:    Patient ID: Robert Mcpherson, male    DOB: 10-11-1976, 47 y.o.   MRN: [id number]  Depression        Please see my recent office visits.  The patient is currently taking Zoloft for the depression.  He states that he is doing relatively well.  He denies any suicidal thoughts.  He does report poor libido.  He is interested in increasing his testosterone to try to improve the libido.  He denies any chest pain or shortness of breath on the testosterone.  His last testosterone level was greater than 400 in May.  However his hemoglobin was slightly elevated and over 17.  Therefore I decided not to change his testosterone at that visit.  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   Nasal turbinate hypertrophy 09/2013   OCD (obsessive compulsive disorder) 04/02/2014   Sleep apnea    Past Surgical History:  Procedure Laterality Date   APPENDECTOMY     LUMBAR LAMINECTOMY/DECOMPRESSION MICRODISCECTOMY Left 12/02/2017   Procedure: Left Lumbar 4-5 disectomy;  Surgeon: Venita Lick, MD;  Location: [facility] OR;  Service: Orthopedics;  Laterality: Left;  2 hrs   NASAL SEPTOPLASTY W/ TURBINOPLASTY Bilateral 10/02/2013   Procedure: BILATERAL NASAL SEPTOPLASTY WITH TURBINATE REDUCTION;  Surgeon: Serena Colonel, MD;  Location: [facility];  Service: ENT;  Laterality: Bilateral;   SHOULDER ARTHROSCOPY Right 11/2013   VASECTOMY  06/02/2013   Current Outpatient Medications on File Prior to Visit  Medication Sig Dispense Refill   clonazePAM (KLONOPIN) 1 MG tablet Take 1 tablet (1 mg total) by mouth 3 (three) times daily as needed. for anxiety 90 tablet 0   EPINEPHrine 0.3 mg/0.3 mL IJ SOAJ injection Inject 0.3 mg into the muscle as needed for anaphylaxis. 1 each 1   sertraline (ZOLOFT) 50 MG tablet Take 2 tablets (100 mg  total) by mouth daily. Decrease venlafaxine as directed and gradually wean up on zoloft 60 tablet 3   Testosterone 20.25 MG/ACT (1.62%) GEL Place 1 Pump onto the skin daily. 75 g 5   No current facility-administered medications on file prior to visit.   Allergies  Allergen Reactions   Cephalexin Hives   Social History   Socioeconomic History   Marital status: Married    Spouse name: Not on file   Number of children: Not on file   Years of education: Not on file   Highest education level: Bachelor's degree (e.g., BA, AB, BS)  Occupational History   Not on file  Tobacco Use   Smoking status: Never   Smokeless tobacco: Never  Vaping Use   Vaping status: Never Used  Substance and Sexual Activity   Alcohol use: No    Comment: history of alcohol use in his 31s, none now    Drug use: Yes    Types: Marijuana   Sexual activity: Yes  Other Topics Concern   Not on file  Social History Narrative   Not on file   Social Determinants of Health   Financial Resource Strain: Medium Risk (01/21/2023)   Overall Financial Resource Strain (CARDIA)    Difficulty of Paying Living Expenses: Somewhat hard  Food Insecurity: Food Insecurity Present (01/21/2023)   Hunger Vital Sign    Worried About Running Out  of Food in the Last Year: Sometimes true    Ran Out of Food in the Last Year: Sometimes true  Transportation Needs: No Transportation Needs (01/21/2023)   PRAPARE - Administrator, Civil Service (Medical): No    Lack of Transportation (Non-Medical): No  Physical Activity: Insufficiently Active (01/21/2023)   Exercise Vital Sign    Days of Exercise per Week: 1 day    Minutes of Exercise per Session: 20 min  Stress: Stress Concern Present (01/21/2023)   [employer] of Occupational Health - Occupational Stress Questionnaire    Feeling of Stress : Very much  Social Connections: Socially Isolated (01/21/2023)   Social Connection and Isolation Panel [NHANES]    Frequency of  Communication with Friends and Family: Twice a week    Frequency of Social Gatherings with Friends and Family: Never    Attends Religious Services: Never    Database administrator or Organizations: No    Attends Engineer, structural: Not on file    Marital Status: Married  Catering manager Violence: Not on file      Review of Systems  All other systems reviewed and are negative.      Objective:   Physical Exam Vitals reviewed.  Constitutional:      Appearance: Normal appearance. He is obese.  Cardiovascular:     Rate and Rhythm: Normal rate and regular rhythm.  Pulmonary:     Effort: Pulmonary effort is normal.     Breath sounds: Normal breath sounds.  Abdominal:     General: Bowel sounds are normal. There is no distension.     Palpations: Abdomen is soft. There is no mass.  Neurological:     Mental Status: He is alert.  Psychiatric:        Behavior: Behavior normal.        Thought Content: Thought content normal.        Judgment: Judgment normal.           Assessment & Plan:  Hypogonadism in male - Plan: CBC with Differential/Platelet, COMPLETE METABOLIC PANEL WITH GFR, PSA, Testosterone Total,Free,Bio, Males  Pure hypercholesterolemia - Plan: CANCELED: Lipid panel Patient states that he is doing relatively well with the Zoloft.  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  He denies any suicidal ideation.  He is due to check a CBC a CMP and a PSA and a testosterone level to monitor for any complications of his testosterone replacement.  I would be willing to increase the testosterone dose to achieve a testosterone level near 700 if this helps his libido as long as it is not causing significant polycythemia and as long as his PSA is stable

## 2023-08-03 LAB — CBC WITH DIFFERENTIAL/PLATELET
Absolute Monocytes: 702 {cells}/uL (ref 200–950)
Basophils Absolute: 63 {cells}/uL (ref 0–200)
Basophils Relative: 0.7 %
Eosinophils Absolute: 261 {cells}/uL (ref 15–500)
Eosinophils Relative: 2.9 %
HCT: 51.2 % — ABNORMAL HIGH (ref 38.5–50.0)
Hemoglobin: 17 g/dL (ref 13.2–17.1)
Lymphs Abs: 2961 {cells}/uL (ref 850–3900)
MCH: 29.3 pg (ref 27.0–33.0)
MCHC: 33.2 g/dL (ref 32.0–36.0)
MCV: 88.3 fL (ref 80.0–100.0)
MPV: 10.5 fL (ref 7.5–12.5)
Monocytes Relative: 7.8 %
Neutro Abs: 5013 {cells}/uL (ref 1500–7800)
Neutrophils Relative %: 55.7 %
Platelets: 281 10*3/uL (ref 140–400)
RBC: 5.8 10*6/uL (ref 4.20–5.80)
RDW: 14 % (ref 11.0–15.0)
Total Lymphocyte: 32.9 %
WBC: 9 10*3/uL (ref 3.8–10.8)

## 2023-08-03 LAB — COMPLETE METABOLIC PANEL WITH GFR
AG Ratio: 1.8 (calc) (ref 1.0–2.5)
ALT: 41 U/L (ref 9–46)
AST: 21 U/L (ref 10–40)
Albumin: 4.4 g/dL (ref 3.6–5.1)
Alkaline phosphatase (APISO): 53 U/L (ref 36–130)
BUN/Creatinine Ratio: 11 (calc) (ref 6–22)
BUN: 16 mg/dL (ref 7–25)
CO2: 27 mmol/L (ref 20–32)
Calcium: 9.5 mg/dL (ref 8.6–10.3)
Chloride: 104 mmol/L (ref 98–110)
Creat: 1.46 mg/dL — ABNORMAL HIGH (ref 0.60–1.29)
Globulin: 2.4 g/dL (ref 1.9–3.7)
Glucose, Bld: 80 mg/dL (ref 65–99)
Potassium: 4.3 mmol/L (ref 3.5–5.3)
Sodium: 141 mmol/L (ref 135–146)
Total Bilirubin: 0.4 mg/dL (ref 0.2–1.2)
Total Protein: 6.8 g/dL (ref 6.1–8.1)
eGFR: 60 mL/min/{1.73_m2} (ref >=60)

## 2023-08-03 LAB — TESTOSTERONE TOTAL,FREE,BIO, MALES
Albumin: 4.4 g/dL (ref 3.6–5.1)
Sex Hormone Binding: 25 nmol/L (ref 10–50)
Testosterone, Bioavailable: 85.6 ng/dL — ABNORMAL LOW (ref 110.0–575.0)
Testosterone, Free: 42.5 pg/mL — ABNORMAL LOW (ref 46.0–224.0)
Testosterone: 271 ng/dL (ref 250–827)

## 2023-08-03 LAB — PSA: PSA: 0.87 ng/mL (ref <=4.00)

## 2023-08-04 ENCOUNTER — Encounter: Payer: Self-pay | Admitting: Family Medicine

## 2023-08-05 ENCOUNTER — Other Ambulatory Visit: Payer: Self-pay | Admitting: Family Medicine

## 2023-08-05 MED ORDER — TESTOSTERONE 20.25 MG/ACT (1.62%) TD GEL: 2.0000 | Freq: Every day | TRANSDERMAL | 5 refills | Status: DC

## 2023-08-19 ENCOUNTER — Other Ambulatory Visit: Payer: Self-pay | Admitting: Family Medicine

## 2023-08-19 MED ORDER — CLONAZEPAM 1 MG PO TABS: 1.0000 mg | ORAL_TABLET | Freq: Three times a day (TID) | ORAL | 0 refills | Status: DC | PRN

## 2023-09-27 ENCOUNTER — Other Ambulatory Visit: Payer: Self-pay | Admitting: Family Medicine

## 2023-09-28 NOTE — Telephone Encounter (Signed)
Requested medication (s) are due for refill today - yes  Requested medication (s) are on the active medication list -yes  Future visit scheduled -no  Last refill: 08/19/23 #90  Notes to clinic: non delegated Rx  Requested Prescriptions  Pending Prescriptions Disp Refills   clonazePAM (KLONOPIN) 1 MG tablet [Pharmacy Med Name: clonazePAM 1 MG Oral Tablet] 90 tablet 0    Sig: TAKE 1 TABLET BY MOUTH THREE TIMES DAILY AS NEEDED FOR ANXIETY     Not Delegated - Psychiatry: Anxiolytics/Hypnotics 2 Failed - 09/27/2023  3:26 PM      Failed - This refill cannot be delegated      Failed - Urine Drug Screen completed in last 360 days      Failed - Valid encounter within last 6 months    Recent Outpatient Visits           1 year ago Viral upper respiratory tract infection   [facility] Valentino Nose, NP   2 years ago Sleep apnea, unspecified type   [facility] Pickard, Priscille Heidelberg, MD   2 years ago General medical exam   [facility] Donita Brooks, MD   2 years ago Other microscopic hematuria   [facility] Valentino Nose, NP   2 years ago Rash in adult   [facility] Pickard, Priscille Heidelberg, MD              Passed - Patient is not pregnant         Requested Prescriptions  Pending Prescriptions Disp Refills   clonazePAM (KLONOPIN) 1 MG tablet [Pharmacy Med Name: clonazePAM 1 MG Oral Tablet] 90 tablet 0    Sig: TAKE 1 TABLET BY MOUTH THREE TIMES DAILY AS NEEDED FOR ANXIETY     Not Delegated - Psychiatry: Anxiolytics/Hypnotics 2 Failed - 09/27/2023  3:26 PM      Failed - This refill cannot be delegated      Failed - Urine Drug Screen completed in last 360 days      Failed - Valid encounter within last 6 months    Recent Outpatient Visits           1 year ago Viral upper respiratory tract infection   [facility] Valentino Nose, NP   2 years ago Sleep apnea,  unspecified type   [facility] Pickard, Priscille Heidelberg, MD   2 years ago General medical exam   [facility] Donita Brooks, MD   2 years ago Other microscopic hematuria   [facility] Valentino Nose, NP   2 years ago Rash in adult   [facility] Pickard, Priscille Heidelberg, MD              Passed - Patient is not pregnant

## 2023-10-05 ENCOUNTER — Telehealth: Payer: Self-pay

## 2023-10-05 ENCOUNTER — Other Ambulatory Visit: Payer: Self-pay | Admitting: Family Medicine

## 2023-10-05 ENCOUNTER — Other Ambulatory Visit: Payer: Self-pay

## 2023-10-05 MED ORDER — CLONAZEPAM 1 MG PO TABS: 1.0000 mg | ORAL_TABLET | Freq: Three times a day (TID) | ORAL | 0 refills | Status: DC | PRN

## 2023-10-05 NOTE — Telephone Encounter (Signed)
Copied from CRM [id number]. Topic: Clinical - Medication Refill >> Oct 05, 2023  2:50 PM Almira Coaster wrote: Most Recent Primary Care Visit:  Provider: Lynnea Ferrier T  Department: [facility]  Visit Type: OFFICE VISIT  Date: 08/02/2023  Medication: clonazePAM (KLONOPIN) 1 MG tablet  Has the patient contacted their pharmacy? Yes (Agent: If no, request that the patient contact the pharmacy for the refill. If patient does not wish to contact the pharmacy document the reason why and proceed with request.) (Agent: If yes, when and what did the pharmacy advise?)  Is this the correct pharmacy for this prescription? No If no, delete pharmacy and type the correct one.  This is the patient's preferred pharmacy:  [facility] [address], [place] - [address] [place] [address] [place] [address] Phone: [phone] Fax: [phone]   Has the prescription been filled recently? No  Is the patient out of the medication? Yes  Has the patient been seen for an appointment in the last year OR does the patient have an upcoming appointment? Yes  Can we respond through MyChart? Yes  Agent: Please be advised that Rx refills may take up to 3 business days. We ask that you follow-up with your pharmacy.

## 2023-10-06 ENCOUNTER — Telehealth: Payer: Self-pay | Admitting: Family Medicine

## 2023-10-06 ENCOUNTER — Other Ambulatory Visit: Payer: Self-pay | Admitting: Family Medicine

## 2023-10-06 MED ORDER — CLONAZEPAM 1 MG PO TABS: 1.0000 mg | ORAL_TABLET | Freq: Three times a day (TID) | ORAL | 0 refills | Status: DC | PRN

## 2023-10-06 NOTE — Telephone Encounter (Signed)
Dr Tanya Nones sent in the patient's Clonazepam 1 mg yesterday. It was sent to [facility] but patient uses [facility]. Would you be willing to send the medication to [facility]? He is currently out of the medication. Thank you.   I called [facility] and the patient has not picked up the medication from them. I asked them to put it back on the shelf. Thanks.

## 2023-10-06 NOTE — Telephone Encounter (Signed)
Prescription Request  10/06/2023  LOV: 08/02/2023  What is the name of the medication or equipment? clonazePAM (KLONOPIN) 1 MG tablet   Have you contacted your pharmacy to request a refill? Yes  This was sent to the wrong pharmacy yesterday Which pharmacy would you like this sent to?  [facility] [address], [place] - [address]  [place] [address] Phone: [phone] Fax: [phone]    Patient notified that their request is being sent to the clinical staff for review and that they should receive a response within 2 business days.   Please advise at [place] [phone]

## 2023-10-08 ENCOUNTER — Other Ambulatory Visit: Payer: Self-pay | Admitting: Otolaryngology

## 2023-10-13 ENCOUNTER — Encounter (HOSPITAL_COMMUNITY): Payer: Self-pay | Admitting: Otolaryngology

## 2023-10-13 NOTE — Progress Notes (Signed)
SDW call  Patient was given pre-op instructions over the phone. Patient verbalized understanding of instructions provided.     PCP - Dr. Lynnea Ferrier Cardiologist -  Pulmonary:    PPM/ICD - denies Device Orders - na Rep Notified - na   Chest x-ray - na EKG -  na Stress Test - ECHO -  Cardiac Cath -   Sleep Study/sleep apnea/CPAP: diagnosed with Sleep apnea.  Cannot tolerate CPAP  Non-diabetic  Blood Thinner Instructions: denies Aspirin Instructions:denies   ERAS Protcol - Clears until 1030   COVID TEST- no    Anesthesia review: No   Patient denies shortness of breath, fever, cough and chest pain over the phone call  Your procedure is scheduled on Thursday October 14, 2023  Report to [facility] Main Entrance "A" at  1100  A.M., then check in with the Admitting office.  Call this number if you have problems the morning of surgery:  [phone]   If you have any questions prior to your surgery date call [phone]: Open Monday-Friday 8am-4pm If you experience any cold or flu symptoms such as cough, fever, chills, shortness of breath, etc. between now and your scheduled surgery, please notify us at the above number     Remember:  Do not eat after midnight the night before your surgery  You may drink clear liquids until  1030   the morning of your surgery.   Clear liquids allowed are: Water, Non-Citrus Juices (without pulp), Carbonated Beverages, Clear Tea, Black Coffee ONLY (NO MILK, CREAM OR POWDERED CREAMER of any kind), and Gatorade   Take these medicines the morning of surgery with A SIP OF WATER:  Zoloft, klonopin  As of today, STOP taking any Aspirin (unless otherwise instructed by your surgeon) Aleve, Naproxen, Ibuprofen, Motrin, Advil, Goody's, BC's, all herbal medications, fish oil, and all vitamins.

## 2023-10-14 ENCOUNTER — Encounter (HOSPITAL_COMMUNITY)
Admission: RE | Disposition: A | Discharge status: Home / Self Care | Payer: Self-pay | Source: Ambulatory Visit | Attending: Otolaryngology

## 2023-10-14 ENCOUNTER — Ambulatory Visit (HOSPITAL_COMMUNITY): Payer: Medicaid Other | Admitting: Anesthesiology

## 2023-10-14 ENCOUNTER — Observation Stay (HOSPITAL_COMMUNITY)
Admission: RE | Admit: 2023-10-14 | Discharge: 2023-10-15 | Disposition: A | Discharge status: Home / Self Care | Payer: Medicaid Other | Source: Ambulatory Visit | Attending: Otolaryngology | Admitting: Otolaryngology

## 2023-10-14 ENCOUNTER — Ambulatory Visit (HOSPITAL_BASED_OUTPATIENT_CLINIC_OR_DEPARTMENT_OTHER): Payer: Medicaid Other | Admitting: Anesthesiology

## 2023-10-14 DIAGNOSIS — G4733 Obstructive sleep apnea (adult) (pediatric): Secondary | ICD-10-CM | POA: Diagnosis not present

## 2023-10-14 DIAGNOSIS — F418 Other specified anxiety disorders: Secondary | ICD-10-CM | POA: Diagnosis not present

## 2023-10-14 DIAGNOSIS — J351 Hypertrophy of tonsils: Principal | ICD-10-CM | POA: Diagnosis present

## 2023-10-14 DIAGNOSIS — G4730 Sleep apnea, unspecified: Secondary | ICD-10-CM | POA: Diagnosis not present

## 2023-10-14 DIAGNOSIS — E785 Hyperlipidemia, unspecified: Secondary | ICD-10-CM | POA: Diagnosis not present

## 2023-10-14 HISTORY — PX: TONSILLECTOMY: SHX5217

## 2023-10-14 LAB — CBC
HCT: 49.4 % (ref 39.0–52.0)
Hemoglobin: 17.2 g/dL — ABNORMAL HIGH (ref 13.0–17.0)
MCH: 29.5 pg (ref 26.0–34.0)
MCHC: 34.8 g/dL (ref 30.0–36.0)
MCV: 84.6 fL (ref 80.0–100.0)
Platelets: 258 10*3/uL (ref 150–400)
RBC: 5.84 MIL/uL — ABNORMAL HIGH (ref 4.22–5.81)
RDW: 13 % (ref 11.5–15.5)
WBC: 8.3 10*3/uL (ref 4.0–10.5)
nRBC: 0 % (ref 0.0–0.2)

## 2023-10-14 SURGERY — TONSILLECTOMY
Anesthesia: General | Laterality: Bilateral

## 2023-10-14 MED ORDER — ROCURONIUM BROMIDE 10 MG/ML (PF) SYRINGE
PREFILLED_SYRINGE | INTRAVENOUS | Status: DC | PRN
  Administered 2023-10-14: 30 mg via INTRAVENOUS

## 2023-10-14 MED ORDER — SUCCINYLCHOLINE CHLORIDE 200 MG/10ML IV SOSY
PREFILLED_SYRINGE | INTRAVENOUS | Status: DC | PRN
  Administered 2023-10-14: 120 mg via INTRAVENOUS

## 2023-10-14 MED ORDER — FENTANYL CITRATE (PF) 250 MCG/5ML IJ SOLN
INTRAMUSCULAR | Status: DC | PRN
  Administered 2023-10-14 (×2): 50 ug via INTRAVENOUS

## 2023-10-14 MED ORDER — ONDANSETRON HCL 4 MG PO TABS: 4.0000 mg | ORAL_TABLET | ORAL | Status: DC | PRN

## 2023-10-14 MED ORDER — ACETAMINOPHEN 500 MG PO TABS
1000.0000 mg | ORAL_TABLET | Freq: Once | ORAL | Status: AC
Start: 2023-10-14 — End: 2023-10-14
  Administered 2023-10-14: 1000 mg via ORAL
  Filled 2023-10-14: qty 2

## 2023-10-14 MED ORDER — ORAL CARE MOUTH RINSE: 15.0000 mL | Freq: Once | OROMUCOSAL | Status: AC

## 2023-10-14 MED ORDER — DEXAMETHASONE SODIUM PHOSPHATE 10 MG/ML IJ SOLN
INTRAMUSCULAR | Status: DC | PRN
  Administered 2023-10-14: 10 mg via INTRAVENOUS

## 2023-10-14 MED ORDER — MIDAZOLAM HCL 2 MG/2ML IJ SOLN
INTRAMUSCULAR | Status: AC
  Filled 2023-10-14: qty 2

## 2023-10-14 MED ORDER — PHENYLEPHRINE 80 MCG/ML (10ML) SYRINGE FOR IV PUSH (FOR BLOOD PRESSURE SUPPORT)
PREFILLED_SYRINGE | INTRAVENOUS | Status: AC
  Filled 2023-10-14: qty 10

## 2023-10-14 MED ORDER — FENTANYL CITRATE (PF) 250 MCG/5ML IJ SOLN
INTRAMUSCULAR | Status: AC
  Filled 2023-10-14: qty 5

## 2023-10-14 MED ORDER — EPHEDRINE 5 MG/ML INJ
INTRAVENOUS | Status: AC
  Filled 2023-10-14: qty 5

## 2023-10-14 MED ORDER — PROPOFOL 10 MG/ML IV BOLUS
INTRAVENOUS | Status: AC
  Filled 2023-10-14: qty 20

## 2023-10-14 MED ORDER — LIDOCAINE 2% (20 MG/ML) 5 ML SYRINGE
INTRAMUSCULAR | Status: AC
  Filled 2023-10-14: qty 5

## 2023-10-14 MED ORDER — KCL IN DEXTROSE-NACL 20-5-0.45 MEQ/L-%-% IV SOLN
INTRAVENOUS | Status: DC
  Filled 2023-10-14 (×2): qty 1000

## 2023-10-14 MED ORDER — SERTRALINE HCL 25 MG PO TABS
50.0000 mg | ORAL_TABLET | Freq: Every day | ORAL | Status: DC
  Administered 2023-10-15: 50 mg via ORAL
  Filled 2023-10-14: qty 2

## 2023-10-14 MED ORDER — HYDROMORPHONE HCL 1 MG/ML IJ SOLN
0.2500 mg | INTRAMUSCULAR | Status: DC | PRN
  Administered 2023-10-14: 0.25 mg via INTRAVENOUS

## 2023-10-14 MED ORDER — AMISULPRIDE (ANTIEMETIC) 5 MG/2ML IV SOLN: 10.0000 mg | Freq: Once | INTRAVENOUS | Status: DC | PRN

## 2023-10-14 MED ORDER — OXYCODONE HCL 5 MG/5ML PO SOLN: 5.0000 mg | Freq: Once | ORAL | Status: DC | PRN

## 2023-10-14 MED ORDER — ONDANSETRON HCL 4 MG/2ML IJ SOLN: 4.0000 mg | Freq: Once | INTRAMUSCULAR | Status: DC | PRN

## 2023-10-14 MED ORDER — HYDROCODONE-ACETAMINOPHEN 7.5-325 MG/15ML PO SOLN
10.0000 mL | ORAL | Status: DC | PRN
  Administered 2023-10-14 – 2023-10-15 (×3): 15 mL via ORAL
  Filled 2023-10-14 (×4): qty 15

## 2023-10-14 MED ORDER — HYDROMORPHONE HCL 1 MG/ML IJ SOLN
INTRAMUSCULAR | Status: AC
  Filled 2023-10-14: qty 1

## 2023-10-14 MED ORDER — SUGAMMADEX SODIUM 200 MG/2ML IV SOLN
INTRAVENOUS | Status: DC | PRN
  Administered 2023-10-14: 200 mg via INTRAVENOUS

## 2023-10-14 MED ORDER — ONDANSETRON HCL 4 MG/2ML IJ SOLN
4.0000 mg | INTRAMUSCULAR | Status: DC | PRN
Start: 2023-10-14 — End: 2023-10-15

## 2023-10-14 MED ORDER — OXYCODONE HCL 5 MG PO TABS: 5.0000 mg | ORAL_TABLET | Freq: Once | ORAL | Status: DC | PRN

## 2023-10-14 MED ORDER — CLONAZEPAM 1 MG PO TABS
1.0000 mg | ORAL_TABLET | Freq: Two times a day (BID) | ORAL | Status: DC | PRN
  Administered 2023-10-14: 1 mg via ORAL
  Filled 2023-10-14: qty 1

## 2023-10-14 MED ORDER — DEXAMETHASONE SODIUM PHOSPHATE 10 MG/ML IJ SOLN
INTRAMUSCULAR | Status: AC
  Filled 2023-10-14: qty 1

## 2023-10-14 MED ORDER — ONDANSETRON HCL 4 MG/2ML IJ SOLN
INTRAMUSCULAR | Status: DC | PRN
  Administered 2023-10-14: 4 mg via INTRAVENOUS

## 2023-10-14 MED ORDER — LIDOCAINE 2% (20 MG/ML) 5 ML SYRINGE
INTRAMUSCULAR | Status: DC | PRN
  Administered 2023-10-14: 100 mg via INTRAVENOUS

## 2023-10-14 MED ORDER — SUCCINYLCHOLINE CHLORIDE 200 MG/10ML IV SOSY
PREFILLED_SYRINGE | INTRAVENOUS | Status: AC
  Filled 2023-10-14: qty 10

## 2023-10-14 MED ORDER — MIDAZOLAM HCL 2 MG/2ML IJ SOLN
INTRAMUSCULAR | Status: DC | PRN
  Administered 2023-10-14: 2 mg via INTRAVENOUS

## 2023-10-14 MED ORDER — PROPOFOL 10 MG/ML IV BOLUS
INTRAVENOUS | Status: DC | PRN
  Administered 2023-10-14: 200 mg via INTRAVENOUS

## 2023-10-14 MED ORDER — CHLORHEXIDINE GLUCONATE 0.12 % MT SOLN
15.0000 mL | Freq: Once | OROMUCOSAL | Status: AC
Start: 2023-10-14 — End: 2023-10-14
  Administered 2023-10-14: 15 mL via OROMUCOSAL
  Filled 2023-10-14: qty 15

## 2023-10-14 MED ORDER — 0.9 % SODIUM CHLORIDE (POUR BTL) OPTIME
TOPICAL | Status: DC | PRN
  Administered 2023-10-14: 1000 mL

## 2023-10-14 MED ORDER — MORPHINE SULFATE (PF) 2 MG/ML IV SOLN
2.0000 mg | INTRAVENOUS | Status: DC | PRN
  Administered 2023-10-14 – 2023-10-15 (×4): 4 mg via INTRAVENOUS
  Filled 2023-10-14 (×4): qty 2

## 2023-10-14 MED ORDER — SODIUM CHLORIDE 0.9 % IV SOLN: INTRAVENOUS | Status: DC | PRN

## 2023-10-14 MED ORDER — OXYMETAZOLINE HCL 0.05 % NA SOLN
NASAL | Status: AC
  Filled 2023-10-14: qty 30

## 2023-10-14 MED ORDER — ROCURONIUM BROMIDE 10 MG/ML (PF) SYRINGE
PREFILLED_SYRINGE | INTRAVENOUS | Status: AC
  Filled 2023-10-14: qty 10

## 2023-10-14 MED ORDER — OXYMETAZOLINE HCL 0.05 % NA SOLN
NASAL | Status: DC | PRN
  Administered 2023-10-14: 1 via TOPICAL

## 2023-10-14 MED ORDER — ONDANSETRON HCL 4 MG/2ML IJ SOLN
INTRAMUSCULAR | Status: AC
  Filled 2023-10-14: qty 2

## 2023-10-14 MED ORDER — EPHEDRINE SULFATE-NACL 50-0.9 MG/10ML-% IV SOSY
PREFILLED_SYRINGE | INTRAVENOUS | Status: DC | PRN
  Administered 2023-10-14: 15 mg via INTRAVENOUS
  Administered 2023-10-14: 10 mg via INTRAVENOUS

## 2023-10-14 SURGICAL SUPPLY — 31 items
BAG COUNTER SPONGE SURGICOUNT (BAG) ×2 IMPLANT
BLADE SURG 15 STRL LF DISP TIS (BLADE) IMPLANT
CANISTER SUCT 3000ML PPV (MISCELLANEOUS) ×2 IMPLANT
CATH ROBINSON RED A/P 10FR (CATHETERS) IMPLANT
CLEANER TIP ELECTROSURG 2X2 (MISCELLANEOUS) ×2 IMPLANT
COAGULATOR SUCT SWTCH 10FR 6 (ELECTROSURGICAL) ×2 IMPLANT
DRAPE HALF SHEET 40X57 (DRAPES) IMPLANT
ELECT COATED BLADE 2.86 ST (ELECTRODE) ×2 IMPLANT
ELECT REM PT RETURN 9FT ADLT (ELECTROSURGICAL)
ELECT REM PT RETURN 9FT PED (ELECTROSURGICAL)
ELECTRODE REM PT RETRN 9FT PED (ELECTROSURGICAL) IMPLANT
ELECTRODE REM PT RTRN 9FT ADLT (ELECTROSURGICAL) IMPLANT
GAUZE 4X4 16PLY ~~LOC~~+RFID DBL (SPONGE) ×2 IMPLANT
GLOVE BIO SURGEON STRL SZ7.5 (GLOVE) ×2 IMPLANT
GOWN STRL REUS W/ TWL LRG LVL3 (GOWN DISPOSABLE) ×4 IMPLANT
KIT BASIN OR (CUSTOM PROCEDURE TRAY) ×2 IMPLANT
KIT TURNOVER KIT B (KITS) ×2 IMPLANT
NDL HYPO 25GX1X1/2 BEV (NEEDLE) IMPLANT
NEEDLE HYPO 25GX1X1/2 BEV (NEEDLE)
NS IRRIG 1000ML POUR BTL (IV SOLUTION) ×2 IMPLANT
PACK SRG BSC III STRL LF ECLPS (CUSTOM PROCEDURE TRAY) ×2 IMPLANT
PAD ARMBOARD 7.5X6 YLW CONV (MISCELLANEOUS) ×4 IMPLANT
PENCIL SMOKE EVACUATOR (MISCELLANEOUS) ×2 IMPLANT
POSITIONER HEAD DONUT 9IN (MISCELLANEOUS) IMPLANT
SPECIMEN JAR SMALL (MISCELLANEOUS) ×4 IMPLANT
SPONGE TONSIL 1.25 RF SGL STRG (GAUZE/BANDAGES/DRESSINGS) IMPLANT
SYR BULB EAR ULCER 3OZ GRN STR (SYRINGE) ×2 IMPLANT
TUBE CONNECTING 12X1/4 (SUCTIONS) ×2 IMPLANT
TUBE SALEM SUMP 14F (TUBING) ×2 IMPLANT
TUBE SALEM SUMP 16F (TUBING) IMPLANT
YANKAUER SUCT BULB TIP NO VENT (SUCTIONS) ×2 IMPLANT

## 2023-10-14 NOTE — Anesthesia Postprocedure Evaluation (Signed)
Anesthesia Post Note  Patient: Robert Mcpherson  Procedure(s) Performed: TONSILLECTOMY (Bilateral)     Patient location during evaluation: PACU Anesthesia Type: General Level of consciousness: awake Pain management: pain level controlled Vital Signs Assessment: post-procedure vital signs reviewed and stable Respiratory status: spontaneous breathing, nonlabored ventilation and respiratory function stable Cardiovascular status: blood pressure returned to baseline and stable Postop Assessment: no apparent nausea or vomiting Anesthetic complications: no   No notable events documented.  Last Vitals:  Vitals:   10/14/23 1533 10/14/23 2011  BP: (!) 139/98 (!) 140/98  Pulse: 80 95  Resp: 18 18  Temp: 36.7 C 36.7 C  SpO2: 92% 91%    Last Pain:  Vitals:   10/14/23 2011  TempSrc: Oral  PainSc:                  [name]

## 2023-10-14 NOTE — Brief Op Note (Signed)
10/14/2023  2:23 PM  PATIENT:  Robert Mcpherson  47 y.o. male  PRE-OPERATIVE DIAGNOSIS:  Tonsillar hypertrophy; Sleep apnea  POST-OPERATIVE DIAGNOSIS:  Tonsillar hypertrophy; Sleep apnea  PROCEDURE:  Procedure(s): TONSILLECTOMY (Bilateral)  SURGEON:  Surgeons and Role:    Christia Reading, MD - Primary  PHYSICIAN ASSISTANT:   ASSISTANTS: none   ANESTHESIA:   general  EBL:  Minimal   BLOOD ADMINISTERED:none  DRAINS: none   LOCAL MEDICATIONS USED:  NONE  SPECIMEN:  No Specimen  DISPOSITION OF SPECIMEN:  N/A  COUNTS:  YES  TOURNIQUET:  * No tourniquets in log *  DICTATION: .Note written in EPIC  PLAN OF CARE: Admit for overnight observation  PATIENT DISPOSITION:  PACU - hemodynamically stable.   Delay start of Pharmacological VTE agent (>24hrs) due to surgical blood loss or risk of bleeding: yes

## 2023-10-14 NOTE — Anesthesia Procedure Notes (Signed)
Procedure Name: Intubation Date/Time: 10/14/2023 2:01 PM  Performed by: Ammie Dalton, CRNAPre-anesthesia Checklist: Patient identified, Emergency Drugs available, Suction available and Patient being monitored Patient Re-evaluated:Patient Re-evaluated prior to induction Oxygen Delivery Method: Circle System Utilized Preoxygenation: Pre-oxygenation with 100% oxygen Induction Type: IV induction Ventilation: Mask ventilation without difficulty and Oral airway inserted - appropriate to patient size Laryngoscope Size: Glidescope and 4 (Lo Pro) Grade View: Grade I Tube type: Oral Tube size: 7.5 mm Number of attempts: 1 Airway Equipment and Method: Oral airway and Rigid stylet Placement Confirmation: ETT inserted through vocal cords under direct vision, positive ETCO2 and breath sounds checked- equal and bilateral Secured at: 22 cm Tube secured with: Tape Dental Injury: Teeth and Oropharynx as per pre-operative assessment

## 2023-10-14 NOTE — Progress Notes (Signed)
Patient's B/P elevated, Dr. Salvadore Farber made aware.  Will continue to monitor.

## 2023-10-14 NOTE — Transfer of Care (Signed)
Immediate Anesthesia Transfer of Care Note  Patient: Cainen G Zenz  Procedure(s) Performed: TONSILLECTOMY (Bilateral)  Patient Location: PACU  Anesthesia Type:General  Level of Consciousness: awake, alert , and oriented  Airway & Oxygen Therapy: Patient Spontanous Breathing and Patient connected to face mask oxygen  Post-op Assessment: Report given to RN and Post -op Vital signs reviewed and stable  Post vital signs: Reviewed and stable  Last Vitals:  Vitals Value Taken Time  BP 145/95 10/14/23 1439  Temp    Pulse 98 10/14/23 1441  Resp 21 10/14/23 1441  SpO2 94 % 10/14/23 1441  Vitals shown include unfiled device data.  Last Pain:  Vitals:   10/14/23 1203  TempSrc:   PainSc: 0-No pain         Complications: No notable events documented.

## 2023-10-14 NOTE — H&P (Signed)
Robert Mcpherson is an 47 y.o. male.   Chief Complaint: Sleep apnea HPI: 47 year old with severe sleep apnea who has been unable to tolerate CPAP.  He was found to have large tonsils.  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   Nasal turbinate hypertrophy 09/2013   OCD (obsessive compulsive disorder) 04/02/2014   Sleep apnea     Past Surgical History:  Procedure Laterality Date   APPENDECTOMY     LUMBAR LAMINECTOMY/DECOMPRESSION MICRODISCECTOMY Left 12/02/2017   Procedure: Left Lumbar 4-5 disectomy;  Surgeon: Venita Lick, MD;  Location: [facility] OR;  Service: Orthopedics;  Laterality: Left;  2 hrs   NASAL SEPTOPLASTY W/ TURBINOPLASTY Bilateral 10/02/2013   Procedure: BILATERAL NASAL SEPTOPLASTY WITH TURBINATE REDUCTION;  Surgeon: Serena Colonel, MD;  Location: [facility];  Service: ENT;  Laterality: Bilateral;   SHOULDER ARTHROSCOPY Right 11/2013   VASECTOMY  06/02/2013    Family History  Problem Relation Age of Onset   Heart disease Maternal Uncle 52       heart attack   Heart disease Maternal Uncle 50       CVA   Colon cancer Neg Hx    Colon polyps Neg Hx    Liver disease Neg Hx    Social History:  reports that he has never smoked. He has never used smokeless tobacco. He reports that he does not currently use drugs after having used the following drugs: Marijuana. He reports that he does not drink alcohol.  Allergies:  Allergies  Allergen Reactions   Cephalexin Hives    Medications Prior to Admission  Medication Sig Dispense Refill   clonazePAM (KLONOPIN) 1 MG tablet Take 1 tablet (1 mg total) by mouth 3 (three) times daily as needed. for anxiety (Patient taking differently: Take 1 mg by mouth 2 (two) times daily. for anxiety) 90 tablet 0   diphenhydrAMINE (BENADRYL) 25 MG tablet Take 25 mg by mouth 2 (two) times daily.      EPINEPHrine 0.3 mg/0.3 mL IJ SOAJ injection Inject 0.3 mg into the muscle as needed for anaphylaxis. 1 each 1   sertraline (ZOLOFT) 50 MG tablet Take 2 tablets (100 mg total) by mouth daily. Decrease venlafaxine as directed and gradually wean up on zoloft (Patient taking differently: Take 50 mg by mouth daily.) 60 tablet 3   Testosterone 20.25 MG/ACT (1.62%) GEL Place 2 Pump onto the skin daily. 75 g 5    Results for orders placed or performed during the hospital encounter of 10/14/23 (from the past 48 hours)  CBC per protocol     Status: Abnormal   Collection Time: 10/14/23 11:33 AM  Result Value Ref Range   WBC 8.3 4.0 - 10.5 K/uL   RBC 5.84 (H) 4.22 - 5.81 MIL/uL   Hemoglobin 17.2 (H) 13.0 - 17.0 g/dL   HCT 62.1 30.8 - 65.7 %   MCV 84.6 80.0 - 100.0 fL   MCH 29.5 26.0 - 34.0 pg   MCHC 34.8 30.0 - 36.0 g/dL   RDW 84.6 96.2 - 95.2 %   Platelets 258 150 - 400 K/uL   nRBC 0.0 0.0 - 0.2 %    Comment: Performed at [facility], [address] [place], [place] [address]   No results found.  Review of Systems  All other systems reviewed and are negative.   Blood pressure (!) 140/109, pulse 88, temperature 98.4 F (36.9 C), temperature source Oral, resp. rate 18, height 5\' 9"  (1.753 m), weight 113.4 kg, SpO2 94%. Physical Exam Constitutional:      Appearance: Normal appearance.  HENT:     Head: Normocephalic and atraumatic.     Right Ear: External ear normal.     Left Ear: External ear normal.     Nose: Nose normal.     Mouth/Throat:     Mouth: Mucous membranes are moist.     Pharynx: Oropharynx is clear.  Eyes:     Extraocular Movements: Extraocular movements intact.     Conjunctiva/sclera: Conjunctivae normal.     Pupils: Pupils are equal, round, and reactive to light.  Cardiovascular:     Rate and Rhythm: Normal rate.  Pulmonary:     Effort: Pulmonary effort is normal.  Musculoskeletal:     Cervical back: Normal range of motion.  Skin:    General: Skin is warm  and dry.  Neurological:     General: No focal deficit present.     Mental Status: He is alert and oriented to person, place, and time.  Psychiatric:        Mood and Affect: Mood normal.        Behavior: Behavior normal.        Thought Content: Thought content normal.        Judgment: Judgment normal.      Assessment/Plan Tonsillar hypertrophy and obstructive sleep apnea  To OR for tonsillectomy.  Plan overnight observation.  Christia Reading, MD 10/14/2023, 1:12 PM

## 2023-10-14 NOTE — Op Note (Signed)
Preop diagnosis: Tonsillar hypertrophy, sleep apnea Postop diagnosis: same Procedure: Tonsillectomy Surgeon: Jenne Pane Anesth: General Compl: None Findings: Tonsils 2+ with stones. Description:  After discussing risks, benefits, and alternatives, the patient was brought to the operative suite and placed on the operative table in the supine position.  Anesthesia was induced and the patient was intubated by the anesthesia team without difficulty.  The bed was turned 90 degrees from anesthesia and the eyes were taped closed.  The patient was given IV Decadron.  A head wrap was placed around the patient's head and the oropharynx was exposed with a Crow-Davis retractor that was placed in suspension on the Mayo stand.   The right tonsil was grasped with a curved Allis and retracted medially while a curvilinear incision was made with the Bovie electrocautery.  Dissection continued in the subcapsular plane until the tonsil was removed.  The same procedure was then performed on the left side.  Tonsils were not sent for pathology.  Bleeding was controlled using suction cautery.  The mouth and nose were copiously irrigated with saline.  A flexible suction catheter was passed down the esophagus to suction out the stomach and esophagus.  The Crow-Davis retractor was taken out of suspension and removed from the patient's mouth.  He was then turned back to anesthesia for wake-up and was extubated and moved to the recovery room in stable condition.

## 2023-10-14 NOTE — Anesthesia Preprocedure Evaluation (Addendum)
Anesthesia Evaluation  Patient identified by MRN, date of birth, ID band Patient awake    Reviewed: Allergy & Precautions, NPO status , Patient's Chart, lab work & pertinent test results  Airway Mallampati: IV  TM Distance: >3 FB Neck ROM: Full    Dental  (+) Teeth Intact, Dental Advisory Given   Pulmonary sleep apnea  Noncompliant w/ CPAP   Pulmonary exam normal breath sounds clear to auscultation       Cardiovascular negative cardio ROS Normal cardiovascular exam Rhythm:Regular Rate:Normal     Neuro/Psych  PSYCHIATRIC DISORDERS Anxiety Depression    negative neurological ROS     GI/Hepatic negative GI ROS, Neg liver ROS,,,  Endo/Other  Obesity BMI 37  Renal/GU negative Renal ROS  negative genitourinary   Musculoskeletal negative musculoskeletal ROS (+)    Abdominal  (+) + obese  Peds  Hematology negative hematology ROS (+)   Anesthesia Other Findings   Reproductive/Obstetrics negative OB ROS                             Anesthesia Physical Anesthesia Plan  ASA: 3  Anesthesia Plan: General   Post-op Pain Management: Tylenol PO (pre-op)*   Induction: Intravenous  PONV Risk Score and Plan: 3 and Ondansetron, Dexamethasone, Midazolam and Treatment may vary due to age or medical condition  Airway Management Planned: Oral ETT  Additional Equipment: None  Intra-op Plan:   Post-operative Plan: Extubation in OR  Informed Consent: I have reviewed the patients History and Physical, chart, labs and discussed the procedure including the risks, benefits and alternatives for the proposed anesthesia with the patient or authorized representative who has indicated his/her understanding and acceptance.     Dental advisory given  Plan Discussed with: CRNA  Anesthesia Plan Comments:        Anesthesia Quick Evaluation

## 2023-10-15 ENCOUNTER — Encounter (HOSPITAL_COMMUNITY): Payer: Self-pay | Admitting: Otolaryngology

## 2023-10-15 ENCOUNTER — Other Ambulatory Visit (HOSPITAL_COMMUNITY): Payer: Self-pay

## 2023-10-15 ENCOUNTER — Other Ambulatory Visit: Payer: Self-pay

## 2023-10-15 DIAGNOSIS — J351 Hypertrophy of tonsils: Secondary | ICD-10-CM | POA: Diagnosis not present

## 2023-10-15 DIAGNOSIS — G4733 Obstructive sleep apnea (adult) (pediatric): Secondary | ICD-10-CM | POA: Diagnosis not present

## 2023-10-15 MED ORDER — HYDROCODONE-ACETAMINOPHEN 7.5-325 MG/15ML PO SOLN
15.0000 mL | ORAL | 0 refills | Status: DC | PRN
  Filled 2023-10-15: qty 450, 5d supply, fill #0
  Filled 2023-10-19: qty 450, 5d supply, fill #1

## 2023-10-15 NOTE — Discharge Summary (Signed)
Physician Discharge Summary  Patient ID: Robert Mcpherson MRN: [id number] DOB/AGE: 46-27-1977 46 y.o.  Admit date: 10/14/2023 Discharge date: 10/15/2023  Admission Diagnoses: Tonsillar hypertrophy and sleep apnea  Discharge Diagnoses:  Principal Problem:   Tonsillar hypertrophy Sleep apnea  Discharged Condition: good  Hospital Course: 47 year old male with sleep apnea and enlarged tonsils presented for tonsillectomy.  See operative note.  He was observed overnight with no problems.  He was felt to be stable for discharge on POD 1.  Consults: None  Significant Diagnostic Studies: None  Treatments: surgery: Tonsillectomy  Discharge Exam: Blood pressure 124/84, pulse 81, temperature 97.8 F (36.6 C), temperature source Oral, resp. rate 16, height 5\' 9"  (1.753 m), weight 118.8 kg, SpO2 94%. General appearance: alert, cooperative, and no distress Throat: no bleeding  Disposition: Discharge disposition: 01-Home or Self Care       Discharge Instructions     Diet - low sodium heart healthy   Complete by: As directed    Discharge instructions   Complete by: As directed    Drink plenty of fluids.  Advance diet as able.  Avoid strenuous activity.  Call with bleeding, inability to drink, or high fevers.   Increase activity slowly   Complete by: As directed       Allergies as of 10/15/2023       Reactions   Cephalexin Hives        Medication List     TAKE these medications    clonazePAM 1 MG tablet Commonly known as: KLONOPIN Take 1 tablet (1 mg total) by mouth 3 (three) times daily as needed. for anxiety What changed: when to take this   diphenhydrAMINE 25 MG tablet Commonly known as: BENADRYL Take 25 mg by mouth 2 (two) times daily.   EPINEPHrine 0.3 mg/0.3 mL Soaj injection Commonly known as: EPI-PEN Inject 0.3 mg into the muscle as needed for anaphylaxis.   HYDROcodone-acetaminophen 7.5-325 mg/15 ml solution Commonly known as: HYCET Take 15 mLs by  mouth every 4 (four) hours as needed for moderate pain (pain score 4-6).   sertraline 50 MG tablet Commonly known as: ZOLOFT Take 2 tablets (100 mg total) by mouth daily. Decrease venlafaxine as directed and gradually wean up on zoloft What changed:  how much to take additional instructions   Testosterone 20.25 MG/ACT (1.62%) Gel Place 2 Pump onto the skin daily.        Follow-up Information     Christia Reading, MD. Schedule an appointment as soon as possible for a visit in 1 month(s).   Specialty: Otolaryngology Contact information: [address] [place] [address] [phone]                 Signed: Christia Reading 10/15/2023, 7:41 AM

## 2023-10-19 ENCOUNTER — Other Ambulatory Visit (HOSPITAL_COMMUNITY): Payer: Self-pay

## 2023-10-25 ENCOUNTER — Other Ambulatory Visit: Payer: Self-pay

## 2023-10-25 ENCOUNTER — Encounter (HOSPITAL_COMMUNITY): Payer: Self-pay

## 2023-10-25 ENCOUNTER — Emergency Department (HOSPITAL_COMMUNITY): Payer: Medicaid Other

## 2023-10-25 ENCOUNTER — Inpatient Hospital Stay (HOSPITAL_COMMUNITY)
Admission: EM | Admit: 2023-10-25 | Discharge: 2023-10-28 | DRG: 392 | Disposition: A | Discharge status: Home / Self Care | Payer: Medicaid Other | Attending: Internal Medicine | Admitting: Internal Medicine

## 2023-10-25 DIAGNOSIS — Z79899 Other long term (current) drug therapy: Secondary | ICD-10-CM

## 2023-10-25 DIAGNOSIS — E7849 Other hyperlipidemia: Secondary | ICD-10-CM

## 2023-10-25 DIAGNOSIS — Z8249 Family history of ischemic heart disease and other diseases of the circulatory system: Secondary | ICD-10-CM

## 2023-10-25 DIAGNOSIS — F332 Major depressive disorder, recurrent severe without psychotic features: Secondary | ICD-10-CM | POA: Diagnosis not present

## 2023-10-25 DIAGNOSIS — K922 Gastrointestinal hemorrhage, unspecified: Secondary | ICD-10-CM | POA: Insufficient documentation

## 2023-10-25 DIAGNOSIS — K529 Noninfective gastroenteritis and colitis, unspecified: Principal | ICD-10-CM | POA: Diagnosis present

## 2023-10-25 DIAGNOSIS — A09 Infectious gastroenteritis and colitis, unspecified: Principal | ICD-10-CM | POA: Diagnosis present

## 2023-10-25 DIAGNOSIS — Z823 Family history of stroke: Secondary | ICD-10-CM

## 2023-10-25 DIAGNOSIS — E785 Hyperlipidemia, unspecified: Secondary | ICD-10-CM | POA: Diagnosis present

## 2023-10-25 DIAGNOSIS — Z888 Allergy status to other drugs, medicaments and biological substances status: Secondary | ICD-10-CM

## 2023-10-25 DIAGNOSIS — K51511 Left sided colitis with rectal bleeding: Secondary | ICD-10-CM | POA: Diagnosis present

## 2023-10-25 DIAGNOSIS — I10 Essential (primary) hypertension: Secondary | ICD-10-CM | POA: Diagnosis present

## 2023-10-25 DIAGNOSIS — G4733 Obstructive sleep apnea (adult) (pediatric): Secondary | ICD-10-CM | POA: Diagnosis present

## 2023-10-25 DIAGNOSIS — K5900 Constipation, unspecified: Secondary | ICD-10-CM | POA: Diagnosis present

## 2023-10-25 DIAGNOSIS — Z6838 Body mass index (BMI) 38.0-38.9, adult: Secondary | ICD-10-CM

## 2023-10-25 DIAGNOSIS — F419 Anxiety disorder, unspecified: Secondary | ICD-10-CM | POA: Diagnosis not present

## 2023-10-25 DIAGNOSIS — E669 Obesity, unspecified: Secondary | ICD-10-CM | POA: Diagnosis present

## 2023-10-25 DIAGNOSIS — E66812 Obesity, class 2: Secondary | ICD-10-CM | POA: Diagnosis present

## 2023-10-25 DIAGNOSIS — F429 Obsessive-compulsive disorder, unspecified: Secondary | ICD-10-CM | POA: Diagnosis not present

## 2023-10-25 DIAGNOSIS — E781 Pure hyperglyceridemia: Secondary | ICD-10-CM | POA: Diagnosis present

## 2023-10-25 LAB — COMPREHENSIVE METABOLIC PANEL
ALT: 33 U/L (ref 0–44)
AST: 18 U/L (ref 15–41)
Albumin: 3.9 g/dL (ref 3.5–5.0)
Alkaline Phosphatase: 59 U/L (ref 38–126)
Anion gap: 12 (ref 5–15)
BUN: 11 mg/dL (ref 6–20)
CO2: 23 mmol/L (ref 22–32)
Calcium: 9.2 mg/dL (ref 8.9–10.3)
Chloride: 102 mmol/L (ref 98–111)
Creatinine, Ser: 1.1 mg/dL (ref 0.61–1.24)
GFR, Estimated: >60 mL/min (ref >60)
Glucose, Bld: 114 mg/dL — ABNORMAL HIGH (ref 70–99)
Potassium: 3.6 mmol/L (ref 3.5–5.1)
Sodium: 137 mmol/L (ref 135–145)
Total Bilirubin: 1 mg/dL (ref <1.2)
Total Protein: 7 g/dL (ref 6.5–8.1)

## 2023-10-25 LAB — CBC
HCT: 52.1 % — ABNORMAL HIGH (ref 39.0–52.0)
HCT: 46.8 % (ref 39.0–52.0)
Hemoglobin: 17.8 g/dL — ABNORMAL HIGH (ref 13.0–17.0)
Hemoglobin: 16.1 g/dL (ref 13.0–17.0)
MCH: 29.1 pg (ref 26.0–34.0)
MCH: 29.3 pg (ref 26.0–34.0)
MCHC: 34.2 g/dL (ref 30.0–36.0)
MCHC: 34.4 g/dL (ref 30.0–36.0)
MCV: 85.1 fL (ref 80.0–100.0)
MCV: 85.1 fL (ref 80.0–100.0)
Platelets: 326 10*3/uL (ref 150–400)
Platelets: 281 10*3/uL (ref 150–400)
RBC: 6.12 MIL/uL — ABNORMAL HIGH (ref 4.22–5.81)
RBC: 5.5 MIL/uL (ref 4.22–5.81)
RDW: 12.9 % (ref 11.5–15.5)
RDW: 12.9 % (ref 11.5–15.5)
WBC: 15.3 10*3/uL — ABNORMAL HIGH (ref 4.0–10.5)
WBC: 13.5 10*3/uL — ABNORMAL HIGH (ref 4.0–10.5)
nRBC: 0 % (ref 0.0–0.2)
nRBC: 0 % (ref 0.0–0.2)

## 2023-10-25 LAB — TYPE AND SCREEN
ABO/RH(D): A POS
Antibody Screen: NEGATIVE

## 2023-10-25 LAB — SEDIMENTATION RATE: Sed Rate: 9 mm/h (ref 0–16)

## 2023-10-25 LAB — HIV ANTIBODY (ROUTINE TESTING W REFLEX): HIV Screen 4th Generation wRfx: NONREACTIVE

## 2023-10-25 LAB — C-REACTIVE PROTEIN: CRP: 4 mg/dL — ABNORMAL HIGH (ref <1.0)

## 2023-10-25 MED ORDER — IOHEXOL 350 MG/ML SOLN
75.0000 mL | Freq: Once | INTRAVENOUS | Status: AC | PRN
Start: 2023-10-25 — End: 2023-10-25
  Administered 2023-10-25: 75 mL via INTRAVENOUS

## 2023-10-25 MED ORDER — CLONAZEPAM 0.5 MG PO TABS
1.0000 mg | ORAL_TABLET | Freq: Three times a day (TID) | ORAL | Status: DC | PRN
Start: 2023-10-25 — End: 2023-10-28
  Administered 2023-10-25 – 2023-10-27 (×3): 1 mg via ORAL
  Filled 2023-10-25 (×3): qty 2

## 2023-10-25 MED ORDER — PANTOPRAZOLE SODIUM 40 MG IV SOLR
80.0000 mg | Freq: Once | INTRAVENOUS | Status: AC
  Administered 2023-10-25: 80 mg via INTRAVENOUS
  Filled 2023-10-25: qty 20

## 2023-10-25 MED ORDER — MELATONIN 5 MG PO TABS
5.0000 mg | ORAL_TABLET | Freq: Every evening | ORAL | Status: AC | PRN
  Administered 2023-10-25 – 2023-10-27 (×2): 5 mg via ORAL
  Filled 2023-10-25 (×2): qty 1

## 2023-10-25 MED ORDER — ACETAMINOPHEN 650 MG RE SUPP: 650.0000 mg | Freq: Four times a day (QID) | RECTAL | Status: DC | PRN

## 2023-10-25 MED ORDER — ACETAMINOPHEN 325 MG PO TABS
650.0000 mg | ORAL_TABLET | Freq: Four times a day (QID) | ORAL | Status: DC | PRN
  Administered 2023-10-25 – 2023-10-26 (×2): 650 mg via ORAL
  Filled 2023-10-25 (×2): qty 2

## 2023-10-25 MED ORDER — SODIUM CHLORIDE 0.9% FLUSH
3.0000 mL | Freq: Two times a day (BID) | INTRAVENOUS | Status: DC
  Administered 2023-10-25 – 2023-10-28 (×6): 3 mL via INTRAVENOUS

## 2023-10-25 MED ORDER — PANTOPRAZOLE SODIUM 40 MG IV SOLR
40.0000 mg | Freq: Two times a day (BID) | INTRAVENOUS | Status: DC
  Administered 2023-10-26 – 2023-10-28 (×5): 40 mg via INTRAVENOUS
  Filled 2023-10-25 (×5): qty 10

## 2023-10-25 MED ORDER — SERTRALINE HCL 50 MG PO TABS
50.0000 mg | ORAL_TABLET | Freq: Every day | ORAL | Status: DC
  Administered 2023-10-26 – 2023-10-28 (×3): 50 mg via ORAL
  Filled 2023-10-25 (×3): qty 1

## 2023-10-25 NOTE — ED Notes (Signed)
Pt in CT.

## 2023-10-25 NOTE — ED Provider Triage Note (Signed)
Emergency Medicine Provider Triage Evaluation Note  Robert Mcpherson , a 47 y.o. male  was evaluated in triage.  Pt complains of blood in stool. Every 15 minutes he has abdominal craming and bleeding  Review of Systems  Positive: Recent antibiotics and ibuprofen Negative: fever  Physical Exam  BP (!) 161/126 (BP Location: Right Arm)   Pulse 100   Temp 98.4 F (36.9 C)   Resp 16   Ht 5\' 9"  (1.753 m)   Wt 118.8 kg   SpO2 95%   BMI 38.68 kg/m  Gen:   Awake, no distress   Resp:  Normal effort  MSK:   Moves extremities without difficulty  Other:    Medical Decision Making  Medically screening exam initiated at 2:27 PM.  Appropriate orders placed.  Robert Mcpherson was informed that the remainder of the evaluation will be completed by another provider, this initial triage assessment does not replace that evaluation, and the importance of remaining in the ED until their evaluation is complete.     Robert Mcpherson, [place] 10/25/23 1428

## 2023-10-25 NOTE — ED Provider Notes (Signed)
[place] EMERGENCY DEPARTMENT AT [facility] Provider Note   CSN: [id number] Arrival date & time: 10/25/23  1230     History  Chief Complaint  Patient presents with   GI Bleeding    Robert Mcpherson is a 47 y.o. male who recently underwent tonsillectomy on 10/20/2023 presents with concern for coffee-ground like stool every 10 to 15 minutes for the past 24 hours.  Denies any bright red blood.  Patient states this came on suddenly and he tried to use an enema after to help his symptoms, but this did not help.  He reports taking at 1000 mg Tylenol and 1000mg  ibuprofen 2-3 times a day for the past couple days for his tonsillectomy pain.  Denies any fevers, chills, recent antibiotic use.  Denies any blood thinner use  HPI     Home Medications Prior to Admission medications   Medication Sig Start Date End Date Taking? Authorizing Provider  clonazePAM (KLONOPIN) 1 MG tablet Take 1 tablet (1 mg total) by mouth 3 (three) times daily as needed. for anxiety Patient taking differently: Take 1 mg by mouth 2 (two) times daily. for anxiety 10/06/23   Park Meo, FNP  diphenhydrAMINE (BENADRYL) 25 MG tablet Take 25 mg by mouth 2 (two) times daily.    [provider]  EPINEPHrine 0.3 mg/0.3 mL IJ SOAJ injection Inject 0.3 mg into the muscle as needed for anaphylaxis. 07/22/21   Donita Brooks, MD  HYDROcodone-acetaminophen (HYCET) 7.5-325 mg/15 ml solution Take 15 mLs by mouth every 4 (four) hours as needed for moderate pain (pain score 4-6). 10/15/23   Christia Reading, MD  sertraline (ZOLOFT) 50 MG tablet Take 2 tablets (100 mg total) by mouth daily. Decrease venlafaxine as directed and gradually wean up on zoloft Patient taking differently: Take 50 mg by mouth daily. 03/30/23   Donita Brooks, MD  Testosterone 20.25 MG/ACT (1.62%) GEL Place 2 Pump onto the skin daily. 08/05/23   Donita Brooks, MD      Allergies    Cephalexin    Review of Systems   Review of Systems   Constitutional:  Negative for fever.  Gastrointestinal:  Positive for abdominal pain and blood in stool.    Physical Exam Updated Vital Signs BP (!) 162/121   Pulse 86   Temp 98.4 F (36.9 C)   Resp 14   Ht 5\' 9"  (1.753 m)   Wt 118.8 kg   SpO2 98%   BMI 38.68 kg/m  Physical Exam Vitals and nursing note reviewed. Exam conducted with a chaperone present.  Constitutional:      General: He is not in acute distress.    Appearance: He is well-developed.  HENT:     Head: Normocephalic and atraumatic.  Eyes:     Conjunctiva/sclera: Conjunctivae normal.  Cardiovascular:     Rate and Rhythm: Normal rate and regular rhythm.     Heart sounds: No murmur heard. Pulmonary:     Effort: Pulmonary effort is normal. No respiratory distress.     Breath sounds: Normal breath sounds.  Abdominal:     Palpations: Abdomen is soft.     Tenderness: There is no abdominal tenderness.     Comments: Left lower quadrant abdominal tenderness palpation, no rebound or guarding  Genitourinary:    Comments: RN Gaffer present for rectal exam No obvious external or internal hemorrhoids. Stool brown appearing, no bright red blood Musculoskeletal:        General: No swelling.  Cervical back: Neck supple.  Skin:    General: Skin is warm and dry.     Capillary Refill: Capillary refill takes less than 2 seconds.  Neurological:     Mental Status: He is alert.  Psychiatric:        Mood and Affect: Mood normal.     ED Results / Procedures / Treatments   Labs (all labs ordered are listed, but only abnormal results are displayed) Labs Reviewed  COMPREHENSIVE METABOLIC PANEL - Abnormal; Notable for the following components:      Result Value   Glucose, Bld 114 (*)    All other components within normal limits  CBC - Abnormal; Notable for the following components:   WBC 15.3 (*)    RBC 6.12 (*)    Hemoglobin 17.8 (*)    HCT 52.1 (*)    All other components within normal limits   GASTROINTESTINAL PANEL BY PCR, STOOL (REPLACES STOOL CULTURE)  SEDIMENTATION RATE  C-REACTIVE PROTEIN  HIV ANTIBODY (ROUTINE TESTING W REFLEX)  COMPREHENSIVE METABOLIC PANEL  CBC  CBC  POC OCCULT BLOOD, ED  TYPE AND SCREEN    EKG None  Radiology CT ABDOMEN PELVIS W CONTRAST Result Date: 10/25/2023 CLINICAL DATA:  Acute non localized abdominal pain. EXAM: CT ABDOMEN AND PELVIS WITH CONTRAST TECHNIQUE: Multidetector CT imaging of the abdomen and pelvis was performed using the standard protocol following bolus administration of intravenous contrast. RADIATION DOSE REDUCTION: This exam was performed according to the departmental dose-optimization program which includes automated exposure control, adjustment of the mA and/or kV according to patient size and/or use of iterative reconstruction technique. CONTRAST:  75mL OMNIPAQUE IOHEXOL 350 MG/ML SOLN COMPARISON:  Noncontrast CT on 04/25/2021 FINDINGS: Lower Chest: No acute findings. Hepatobiliary: No suspicious hepatic masses identified. Gallbladder is unremarkable. No evidence of biliary ductal dilatation. Pancreas:  No mass or inflammatory changes. Spleen: Within normal limits in size and appearance. Adrenals/Urinary Tract: No suspicious masses identified. No evidence of ureteral calculi or hydronephrosis. Stomach/Bowel: Moderate wall thickening is seen involving the sigmoid colon with pericolonic inflammatory changes. No definite diverticular changes are seen, and this is consistent with nonspecific colitis. No evidence of abscess or free fluid. Vascular/Lymphatic: No pathologically enlarged lymph nodes. No acute vascular findings. Reproductive:  No mass or other significant abnormality. Other:  None. Musculoskeletal:  No suspicious bone lesions identified. IMPRESSION: Moderate nonspecific sigmoid colitis. No evidence of abscess or other complication. Electronically Signed   By: Danae Orleans M.D.   On: 10/25/2023 15:45    Procedures Procedures     Medications Ordered in ED Medications  sertraline (ZOLOFT) tablet 50 mg (has no administration in time range)  clonazePAM (KLONOPIN) tablet 1 mg (has no administration in time range)  sodium chloride flush (NS) 0.9 % injection 3 mL (has no administration in time range)  acetaminophen (TYLENOL) tablet 650 mg (has no administration in time range)    Or  acetaminophen (TYLENOL) suppository 650 mg (has no administration in time range)  iohexol (OMNIPAQUE) 350 MG/ML injection 75 mL (75 mLs Intravenous Contrast Given 10/25/23 1505)  pantoprazole (PROTONIX) injection 80 mg (80 mg Intravenous Given 10/25/23 1717)    ED Course/ Medical Decision Making/ A&P                                 Medical Decision Making Amount and/or Complexity of Data Reviewed Labs: ordered.  Risk Prescription drug management. Decision regarding hospitalization.  Differential diagnosis includes but is not limited to Cholelithiasis, cholangitis, choledocholithiasis, peptic ulcer, gastritis, gastroenteritis, appendicitis, IBS, IBD, DKA, nephrolithiasis, UTI, pyelonephritis, pancreatitis, diverticulitis, mesenteric ischemia, abdominal aortic aneurysm, small bowel obstruction, volvulus, testicular torsion, ovarian torsion, and females of childbearing age pregnancy   ED Course:  Patient well-appearing, stable vital signs.  He reports coffee-ground stools for the past day every 10 to 15 minutes.  Hemoglobin here is stable at 17.8.  He recently had a tonsillectomy and reports taking 1000 mg ibuprofen 2-3 times a day.  Given this large amount of ibuprofen use, and new onset abdominal pain and coffee-ground stools, suspect possible ulcer component to his presentation.  CT scan does show a sigmoid colitis.  He does have a leukocytosis of 15.3.  However, no signs of systemic infection such as fevers, chills.  Unsure if this leukocytosis may be related to his recent surgery. GI was consulted, they will plan on seeing  patient in consultation for colitis and further management.  Added on stool sample, CRP, sed rate per recommendation.  Consulted hospitalist who will admit patient for further management.   Impression: Acute GI bleed Sigmoid colitis  Disposition:  Patient admitted with Dr. Alinda Money  Lab Tests: I Ordered, and personally interpreted labs.  The pertinent results include:   CBC with leukocytosis of 15.3, hemoglobin 17.8 CMP unremarkable  Imaging Studies ordered: I ordered imaging studies including CT abdomen pelvis I independently visualized the imaging with scope of interpretation limited to determining acute life threatening conditions related to emergency care. Imaging showed moderate sigmoid colitis I agree with the radiologist interpretation   Cardiac Monitoring: / EKG: The patient was maintained on a cardiac monitor.  I personally viewed and interpreted the cardiac monitored which showed an underlying rhythm of: Normal sinus rhythm   Consultations Obtained: I requested consultation with the Quentin Mulling PA with GI,  and discussed lab and imaging findings as well as pertinent plan - they recommend: ESR, CRP, and stool samples added. They will plan to see patient in consultation for colitis  I requested consultation with the hospitalist Dr. Alinda Money,  and discussed lab and imaging findings as well as pertinent plan - they recommend: Admission              Final Clinical Impression(s) / ED Diagnoses Final diagnoses:  Colitis  Acute GI bleeding    Rx / DC Orders ED Discharge Orders     None         Arabella Merles, PA-C 10/25/23 1724    Charlynne Pander, MD 10/25/23 [phone]

## 2023-10-25 NOTE — ED Notes (Signed)
ED TO INPATIENT HANDOFF REPORT  ED Nurse Name and Phone #: Mandi B (night shift taking over for day shift) Previously Darral Dash R. (Day shift)  S Name/Age/Gender Robert Mcpherson 47 y.o. male Room/Bed: 027C/027C  Code Status   Code Status: Full Code  Home/SNF/Other Home Patient oriented to: self, place, time, and situation Is this baseline? Yes   Triage Complete: Triage complete  Chief Complaint Colitis [K52.9]  Triage Note Pt c/o coffee ground stool every 10-15 mins for 24 hrs. Pt denies blood thinners. Pt c/o abdominal pain. Pt state he gave himself 3 enema's yesterday, because he though he was constipated, because he kept having the urge to have a BM.   Allergies Allergies  Allergen Reactions   Cephalexin Hives    Level of Care/Admitting Diagnosis ED Disposition     ED Disposition  Admit   Condition  --   Comment  Hospital Area: [facility] [[id number]]  Level of Care: Telemetry Medical [104]  May place patient in observation at [facility] or [facility] if equivalent level of care is available:: No  Covid Evaluation: Asymptomatic - no recent exposure (last 10 days) testing not required  Diagnosis: Colitis [130865]  Admitting Physician: Synetta Fail [[id number]]  Attending Physician: Synetta Fail [[id number]]          B Medical/Surgery History 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   Nasal turbinate hypertrophy 09/2013   OCD (obsessive compulsive disorder) 04/02/2014   Sensation of fullness in right ear 09/13/2018   Sleep apnea    Snoring 09/13/2018   Past Surgical History:  Procedure Laterality Date   APPENDECTOMY     LUMBAR LAMINECTOMY/DECOMPRESSION MICRODISCECTOMY Left 12/02/2017   Procedure: Left Lumbar 4-5 disectomy;  Surgeon: Venita Lick, MD;  Location: [facility] OR;  Service: Orthopedics;   Laterality: Left;  2 hrs   NASAL SEPTOPLASTY W/ TURBINOPLASTY Bilateral 10/02/2013   Procedure: BILATERAL NASAL SEPTOPLASTY WITH TURBINATE REDUCTION;  Surgeon: Serena Colonel, MD;  Location: [facility];  Service: ENT;  Laterality: Bilateral;   SHOULDER ARTHROSCOPY Right 11/2013   TONSILLECTOMY Bilateral 10/14/2023   Procedure: TONSILLECTOMY;  Surgeon: Christia Reading, MD;  Location: [facility] OR;  Service: ENT;  Laterality: Bilateral;   VASECTOMY  06/02/2013     A IV Location/Drains/Wounds Patient Lines/Drains/Airways Status     Active Line/Drains/Airways     Name Placement date Placement time Site Days   Peripheral IV 10/25/23 20 G Anterior;Distal;Right;Upper Arm 10/25/23  1433  Arm  less than 1            Intake/Output Last 24 hours No intake or output data in the 24 hours ending 10/25/23 1903  Labs/Imaging Results for orders placed or performed during the hospital encounter of 10/25/23 (from the past 48 hours)  Comprehensive metabolic panel     Status: Abnormal   Collection Time: 10/25/23 12:55 PM  Result Value Ref Range   Sodium 137 135 - 145 mmol/L   Potassium 3.6 3.5 - 5.1 mmol/L   Chloride 102 98 - 111 mmol/L   CO2 23 22 - 32 mmol/L   Glucose, Bld 114 (H) 70 - 99 mg/dL    Comment: Glucose reference range applies only to samples taken after fasting for at least 8 hours.   BUN 11 6 -  20 mg/dL   Creatinine, Ser 1.61 0.61 - 1.24 mg/dL   Calcium 9.2 8.9 - 09.6 mg/dL   Total Protein 7.0 6.5 - 8.1 g/dL   Albumin 3.9 3.5 - 5.0 g/dL   AST 18 15 - 41 U/L   ALT 33 0 - 44 U/L   Alkaline Phosphatase 59 38 - 126 U/L   Total Bilirubin 1.0 <1.2 mg/dL   GFR, Estimated >04 >54 mL/min    Comment: (NOTE) Calculated using the CKD-EPI Creatinine Equation (2021)    Anion gap 12 5 - 15    Comment: Performed at [facility], [address] [place], [place] [address]  CBC     Status: Abnormal   Collection Time: 10/25/23 12:55 PM  Result Value Ref Range   WBC 15.3 (H) 4.0 -  10.5 K/uL   RBC 6.12 (H) 4.22 - 5.81 MIL/uL   Hemoglobin 17.8 (H) 13.0 - 17.0 g/dL   HCT 91.4 (H) 78.2 - 95.6 %   MCV 85.1 80.0 - 100.0 fL   MCH 29.1 26.0 - 34.0 pg   MCHC 34.2 30.0 - 36.0 g/dL   RDW 21.3 08.6 - 57.8 %   Platelets 326 150 - 400 K/uL   nRBC 0.0 0.0 - 0.2 %    Comment: Performed at [facility], [address], [place], [place] [address]  Type and screen [facility]     Status: None   Collection Time: 10/25/23 12:57 PM  Result Value Ref Range   ABO/RH(D) A POS    Antibody Screen NEG    Sample Expiration      10/28/2023,2359 Performed at [facility], [address] [place], [place] [address]   Sedimentation rate     Status: None   Collection Time: 10/25/23  5:03 PM  Result Value Ref Range   Sed Rate 9 0 - 16 mm/hr    Comment: Performed at [facility], [address] [place], [place] [address]  C-reactive protein     Status: Abnormal   Collection Time: 10/25/23  5:03 PM  Result Value Ref Range   CRP 4.0 (H) <1.0 mg/dL    Comment: Performed at [facility], [address] [place], [place] [address]   CT ABDOMEN PELVIS W CONTRAST Result Date: 10/25/2023 CLINICAL DATA:  Acute non localized abdominal pain. EXAM: CT ABDOMEN AND PELVIS WITH CONTRAST TECHNIQUE: Multidetector CT imaging of the abdomen and pelvis was performed using the standard protocol following bolus administration of intravenous contrast. RADIATION DOSE REDUCTION: This exam was performed according to the departmental dose-optimization program which includes automated exposure control, adjustment of the mA and/or kV according to patient size and/or use of iterative reconstruction technique. CONTRAST:  75mL OMNIPAQUE IOHEXOL 350 MG/ML SOLN COMPARISON:  Noncontrast CT on 04/25/2021 FINDINGS: Lower Chest: No acute findings. Hepatobiliary: No suspicious hepatic masses identified. Gallbladder is unremarkable. No evidence of biliary ductal dilatation. Pancreas:  No mass  or inflammatory changes. Spleen: Within normal limits in size and appearance. Adrenals/Urinary Tract: No suspicious masses identified. No evidence of ureteral calculi or hydronephrosis. Stomach/Bowel: Moderate wall thickening is seen involving the sigmoid colon with pericolonic inflammatory changes. No definite diverticular changes are seen, and this is consistent with nonspecific colitis. No evidence of abscess or free fluid. Vascular/Lymphatic: No pathologically enlarged lymph nodes. No acute vascular findings. Reproductive:  No mass or other significant abnormality. Other:  None. Musculoskeletal:  No suspicious bone lesions identified. IMPRESSION: Moderate nonspecific  sigmoid colitis. No evidence of abscess or other complication. Electronically Signed   By: Danae Orleans M.D.   On: 10/25/2023 15:45    Pending Labs Unresulted Labs (From admission, onward)     Start     Ordered   10/26/23 0500  Comprehensive metabolic panel  Tomorrow morning,   R        10/25/23 1721   10/25/23 1722  CBC  Now then every 12 hours,   R (with TIMED occurrences)      10/25/23 1721   10/25/23 1720  HIV Antibody (routine testing w rflx)  (HIV Antibody (Routine testing w reflex) panel)  Once,   R        10/25/23 1721   10/25/23 1659  Gastrointestinal Panel by PCR , Stool  (Gastrointestinal Panel by PCR, Stool                                                                                                                                                     **Does Not include CLOSTRIDIUM DIFFICILE testing. **If CDIFF testing is needed, place order from the "C Difficile Testing" order set.**)  Once,   URGENT        10/25/23 1658            Vitals/Pain Today's Vitals   10/25/23 1745 10/25/23 1800 10/25/23 1804 10/25/23 1815  BP: (!) 149/95 (!) 156/102  (!) 164/104  Pulse: 94 86  87  Resp: 19 18  (!) 22  Temp:   99 F (37.2 C)   TempSrc:   Oral   SpO2: 96% 95%  96%  Weight:      Height:      PainSc:         Isolation Precautions Enteric precautions (UV disinfection)  Medications Medications  sertraline (ZOLOFT) tablet 50 mg (has no administration in time range)  clonazePAM (KLONOPIN) tablet 1 mg (has no administration in time range)  sodium chloride flush (NS) 0.9 % injection 3 mL (has no administration in time range)  acetaminophen (TYLENOL) tablet 650 mg (has no administration in time range)    Or  acetaminophen (TYLENOL) suppository 650 mg (has no administration in time range)  pantoprazole (PROTONIX) injection 40 mg (has no administration in time range)  iohexol (OMNIPAQUE) 350 MG/ML injection 75 mL (75 mLs Intravenous Contrast Given 10/25/23 1505)  pantoprazole (PROTONIX) injection 80 mg (80 mg Intravenous Given 10/25/23 1717)    Mobility walks     Focused Assessments GI   R Recommendations: See Admitting Provider Note  Report given to:   Additional Notes: Will still need to collect stool sample.

## 2023-10-25 NOTE — ED Triage Notes (Addendum)
Pt c/o coffee ground stool every 10-15 mins for 24 hrs. Pt denies blood thinners. Pt c/o abdominal pain. Pt state he gave himself 3 enema's yesterday, because he though he was constipated, because he kept having the urge to have a BM.

## 2023-10-25 NOTE — H&P (Signed)
History and Physical   Robert Mcpherson GMW:[id number] DOB: Feb 12, 1976 DOA: 10/25/2023  PCP: Donita Brooks, MD   Patient coming from: Home  Chief Complaint:?  GI bleed  HPI: Robert Mcpherson is a 47 y.o. male with medical history significant of hyperlipidemia, anxiety, depression, OCD, OSA on CPAP, status post microdiscectomy, tonsillectomy presenting with possible GI bleed.  Patient reports undergoing tonsillectomy on 12/18.  Has noticed some coffee-ground stool for the past 24 hours which started several days after his tonsillectomy.  States he has had stool every 10 to 15 minutes during that time.  Denies any bright red stools.  Has tried a enema without improvement.  Has been taking Tylenol and ibuprofen every few hours as recommended status post tonsillectomy.  Denies fevers, chills, chest pain, shortness of breath, abdominal pain, constipation, nausea, vomiting.  ED Course: Low signs in the ED notable for blood pressure in the 140s to 160s systolic.  Lab workup included CMP with glucose 114.  CBC showed mild leukocytosis to 15.3 and hemoglobin stable at 17.8.  CRP and ESR pending.  FOBT positive per report.  GI pathogen panel pending.  Patient typed and screened in the ED.  IV PPI given in the ED.  CT abdomen pelvis showed moderate nonspecific sigmoid colitis with no evidence of abscess or other complication.  GI consulted and recommended above ESR and CRP as well as GI pathogen panel.  Review of Systems: As per HPI otherwise all other systems reviewed and are negative.  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   Nasal turbinate hypertrophy 09/2013   OCD (obsessive compulsive disorder) 04/02/2014   Sensation of fullness in right ear 09/13/2018   Sleep apnea    Snoring 09/13/2018    Past Surgical History:  Procedure Laterality Date    APPENDECTOMY     LUMBAR LAMINECTOMY/DECOMPRESSION MICRODISCECTOMY Left 12/02/2017   Procedure: Left Lumbar 4-5 disectomy;  Surgeon: Venita Lick, MD;  Location: [facility] OR;  Service: Orthopedics;  Laterality: Left;  2 hrs   NASAL SEPTOPLASTY W/ TURBINOPLASTY Bilateral 10/02/2013   Procedure: BILATERAL NASAL SEPTOPLASTY WITH TURBINATE REDUCTION;  Surgeon: Serena Colonel, MD;  Location: [facility];  Service: ENT;  Laterality: Bilateral;   SHOULDER ARTHROSCOPY Right 11/2013   TONSILLECTOMY Bilateral 10/14/2023   Procedure: TONSILLECTOMY;  Surgeon: Christia Reading, MD;  Location: [facility] OR;  Service: ENT;  Laterality: Bilateral;   VASECTOMY  06/02/2013    Social History  reports that he has never smoked. He has never used smokeless tobacco. He reports that he does not currently use alcohol. He reports that he does not currently use drugs after having used the following drugs: Marijuana.  Allergies  Allergen Reactions   Cephalexin Hives    Family History  Problem Relation Age of Onset   Heart disease Maternal Uncle 52       heart attack   Heart disease Maternal Uncle 45       CVA   Colon cancer Neg Hx    Colon polyps Neg Hx    Liver disease Neg Hx   Reviewed on admission  Prior to Admission medications   Medication Sig Start Date End Date Taking? Authorizing Provider  clonazePAM (KLONOPIN) 1 MG tablet Take 1 tablet (1 mg total) by mouth 3 (three) times daily as needed.  for anxiety Patient taking differently: Take 1 mg by mouth 2 (two) times daily. for anxiety 10/06/23   Park Meo, FNP  diphenhydrAMINE (BENADRYL) 25 MG tablet Take 25 mg by mouth 2 (two) times daily.    [provider]  EPINEPHrine 0.3 mg/0.3 mL IJ SOAJ injection Inject 0.3 mg into the muscle as needed for anaphylaxis. 07/22/21   Donita Brooks, MD  HYDROcodone-acetaminophen (HYCET) 7.5-325 mg/15 ml solution Take 15 mLs by mouth every 4 (four) hours as needed for moderate pain (pain score 4-6). 10/15/23    Christia Reading, MD  sertraline (ZOLOFT) 50 MG tablet Take 2 tablets (100 mg total) by mouth daily. Decrease venlafaxine as directed and gradually wean up on zoloft Patient taking differently: Take 50 mg by mouth daily. 03/30/23   Donita Brooks, MD  Testosterone 20.25 MG/ACT (1.62%) GEL Place 2 Pump onto the skin daily. 08/05/23   Donita Brooks, MD    Physical Exam: Vitals:   10/25/23 1245 10/25/23 1251 10/25/23 1530 10/25/23 1600  BP: (!) 161/126  (!) 148/118 (!) 162/121  Pulse: 100  84 86  Resp: 16  10 14   Temp: 98.4 F (36.9 C)     SpO2: 95%  98% 98%  Weight:  118.8 kg    Height:  5\' 9"  (1.753 m)      Physical Exam Constitutional:      General: He is not in acute distress.    Appearance: Normal appearance.  HENT:     Head: Normocephalic and atraumatic.     Mouth/Throat:     Mouth: Mucous membranes are moist.     Pharynx: Oropharynx is clear.  Eyes:     Extraocular Movements: Extraocular movements intact.     Pupils: Pupils are equal, round, and reactive to light.  Cardiovascular:     Rate and Rhythm: Normal rate and regular rhythm.     Pulses: Normal pulses.     Heart sounds: Normal heart sounds.  Pulmonary:     Effort: Pulmonary effort is normal. No respiratory distress.     Breath sounds: Normal breath sounds.  Abdominal:     General: Bowel sounds are normal. There is no distension.     Palpations: Abdomen is soft.     Tenderness: There is no abdominal tenderness.  Musculoskeletal:        General: No swelling or deformity.  Skin:    General: Skin is warm and dry.  Neurological:     General: No focal deficit present.     Mental Status: Mental status is at baseline.    Labs on Admission: I have personally reviewed following labs and imaging studies  CBC: Recent Labs  Lab 10/25/23 1255  WBC 15.3*  HGB 17.8*  HCT 52.1*  MCV 85.1  PLT 326    Basic Metabolic Panel: Recent Labs  Lab 10/25/23 1255  NA 137  K 3.6  CL 102  CO2 23  GLUCOSE 114*   BUN 11  CREATININE 1.10  CALCIUM 9.2    GFR: Estimated Creatinine Clearance: 106.7 mL/min (by C-G formula based on SCr of 1.1 mg/dL).  Liver Function Tests: Recent Labs  Lab 10/25/23 1255  AST 18  ALT 33  ALKPHOS 59  BILITOT 1.0  PROT 7.0  ALBUMIN 3.9    Urine analysis:    Component Value Date/Time   COLORURINE AMBER (A) 04/25/2021 1537   APPEARANCEUR CLOUDY (A) 04/25/2021 1537   LABSPEC 1.020 04/25/2021 1537   PHURINE 5.0 04/25/2021 1537  GLUCOSEU NEGATIVE 04/25/2021 1537   HGBUR LARGE (A) 04/25/2021 1537   BILIRUBINUR NEGATIVE 04/25/2021 1537   KETONESUR NEGATIVE 04/25/2021 1537   PROTEINUR 30 (A) 04/25/2021 1537   UROBILINOGEN 0.2 12/27/2014 0219   NITRITE POSITIVE (A) 04/25/2021 1537   LEUKOCYTESUR NEGATIVE 04/25/2021 1537    Radiological Exams on Admission: CT ABDOMEN PELVIS W CONTRAST Result Date: 10/25/2023 CLINICAL DATA:  Acute non localized abdominal pain. EXAM: CT ABDOMEN AND PELVIS WITH CONTRAST TECHNIQUE: Multidetector CT imaging of the abdomen and pelvis was performed using the standard protocol following bolus administration of intravenous contrast. RADIATION DOSE REDUCTION: This exam was performed according to the departmental dose-optimization program which includes automated exposure control, adjustment of the mA and/or kV according to patient size and/or use of iterative reconstruction technique. CONTRAST:  75mL OMNIPAQUE IOHEXOL 350 MG/ML SOLN COMPARISON:  Noncontrast CT on 04/25/2021 FINDINGS: Lower Chest: No acute findings. Hepatobiliary: No suspicious hepatic masses identified. Gallbladder is unremarkable. No evidence of biliary ductal dilatation. Pancreas:  No mass or inflammatory changes. Spleen: Within normal limits in size and appearance. Adrenals/Urinary Tract: No suspicious masses identified. No evidence of ureteral calculi or hydronephrosis. Stomach/Bowel: Moderate wall thickening is seen involving the sigmoid colon with pericolonic inflammatory  changes. No definite diverticular changes are seen, and this is consistent with nonspecific colitis. No evidence of abscess or free fluid. Vascular/Lymphatic: No pathologically enlarged lymph nodes. No acute vascular findings. Reproductive:  No mass or other significant abnormality. Other:  None. Musculoskeletal:  No suspicious bone lesions identified. IMPRESSION: Moderate nonspecific sigmoid colitis. No evidence of abscess or other complication. Electronically Signed   By: Danae Orleans M.D.   On: 10/25/2023 15:45    EKG: Not performed in emergency department  Assessment/Plan Principal Problem:   Colitis Active Problems:   Anxiety   OCD (obsessive compulsive disorder)   Obstructive sleep apnea treated with continuous positive airway pressure (CPAP)   HLD (hyperlipidemia)   Severe recurrent major depression without psychotic features (HCC)   GI bleed   Colitis GI bleed > Presenting with concern for possible GI bleed with coffee-ground stool for the past 24 hours about every 10 to 15 minutes. > Unclear etiology.  CT abdomen pelvis showed moderate nonspecific sigmoid colitis without abscess or other complication. > Groundglass bleeding could be secondary to this colitis versus taking ibuprofen frequently status post tonsillectomy.  Bleeding appears to have started too long after tonsillectomy to have been from ingested blood from procedure. > Hemoglobin remained stably mildly elevated at 17.8.  Mild leukocytosis to 15.3 could be reactive versus inflammatory, no antibiotics for colitis at this point. > GI consulted in the ED and are following.  Type and screen performed in the ED. - Monitor on telemetry overnight - Appreciate GI recommendations and assistance - Trend fever curve and WBC - Trend hemoglobin overnight - Continue with IV PPI - Full liquid diet - Supportive care  Anxiety OCD - Continue home sertraline and clonazepam  OSA - Continue home CPAP  DVT prophylaxis: SCDs Code  Status:   Full Family Communication:  Updated at bedside  Disposition Plan:   Patient is from:  Home  Anticipated DC to:  Home  Anticipated DC date:  1 to 3 days  Anticipated DC barriers: None  Consults called:  Gastroenterology Admission status:  Observation, telemetry  Severity of Illness: The appropriate patient status for this patient is OBSERVATION. Observation status is judged to be reasonable and necessary in order to provide the required intensity of service to ensure  the patient's safety. The patient's presenting symptoms, physical exam findings, and initial radiographic and laboratory data in the context of their medical condition is felt to place them at decreased risk for further clinical deterioration. Furthermore, it is anticipated that the patient will be medically stable for discharge from the hospital within 2 midnights of admission.    Synetta Fail MD [facility]  How to contact the [facility] Attending or Consulting provider 7A - 7P or covering provider during after hours 7P -7A, for this patient?   Check the care team in [facility] and look for a) attending/consulting [facility] provider listed and b) the [facility] team listed Log into [email or web address] and use [facility]'s universal password to access. If you do not have the password, please contact the hospital operator. Locate the [facility] provider you are looking for under [facility] and page to a number that you can be directly reached. If you still have difficulty reaching the provider, please page the [facility] (Director on Call) for the Hospitalists listed on amion for assistance.  10/25/2023, 5:26 PM

## 2023-10-25 NOTE — Plan of Care (Signed)

## 2023-10-26 DIAGNOSIS — R6883 Chills (without fever): Secondary | ICD-10-CM

## 2023-10-26 DIAGNOSIS — Z823 Family history of stroke: Secondary | ICD-10-CM | POA: Diagnosis not present

## 2023-10-26 DIAGNOSIS — F429 Obsessive-compulsive disorder, unspecified: Secondary | ICD-10-CM | POA: Diagnosis not present

## 2023-10-26 DIAGNOSIS — I10 Essential (primary) hypertension: Secondary | ICD-10-CM | POA: Diagnosis not present

## 2023-10-26 DIAGNOSIS — R519 Headache, unspecified: Secondary | ICD-10-CM | POA: Diagnosis not present

## 2023-10-26 DIAGNOSIS — A09 Infectious gastroenteritis and colitis, unspecified: Secondary | ICD-10-CM | POA: Diagnosis not present

## 2023-10-26 DIAGNOSIS — R1032 Left lower quadrant pain: Secondary | ICD-10-CM | POA: Diagnosis not present

## 2023-10-26 DIAGNOSIS — K921 Melena: Secondary | ICD-10-CM

## 2023-10-26 DIAGNOSIS — K529 Noninfective gastroenteritis and colitis, unspecified: Secondary | ICD-10-CM | POA: Diagnosis not present

## 2023-10-26 DIAGNOSIS — R110 Nausea: Secondary | ICD-10-CM | POA: Diagnosis not present

## 2023-10-26 DIAGNOSIS — K5900 Constipation, unspecified: Secondary | ICD-10-CM | POA: Diagnosis not present

## 2023-10-26 DIAGNOSIS — F419 Anxiety disorder, unspecified: Secondary | ICD-10-CM | POA: Diagnosis not present

## 2023-10-26 DIAGNOSIS — K51511 Left sided colitis with rectal bleeding: Secondary | ICD-10-CM | POA: Diagnosis not present

## 2023-10-26 DIAGNOSIS — E66812 Obesity, class 2: Secondary | ICD-10-CM | POA: Diagnosis not present

## 2023-10-26 DIAGNOSIS — D72829 Elevated white blood cell count, unspecified: Secondary | ICD-10-CM

## 2023-10-26 DIAGNOSIS — K922 Gastrointestinal hemorrhage, unspecified: Secondary | ICD-10-CM | POA: Diagnosis not present

## 2023-10-26 DIAGNOSIS — Z8249 Family history of ischemic heart disease and other diseases of the circulatory system: Secondary | ICD-10-CM | POA: Diagnosis not present

## 2023-10-26 DIAGNOSIS — G4733 Obstructive sleep apnea (adult) (pediatric): Secondary | ICD-10-CM | POA: Diagnosis not present

## 2023-10-26 DIAGNOSIS — Z6838 Body mass index (BMI) 38.0-38.9, adult: Secondary | ICD-10-CM | POA: Diagnosis not present

## 2023-10-26 DIAGNOSIS — E669 Obesity, unspecified: Secondary | ICD-10-CM | POA: Diagnosis not present

## 2023-10-26 DIAGNOSIS — E781 Pure hyperglyceridemia: Secondary | ICD-10-CM | POA: Diagnosis not present

## 2023-10-26 DIAGNOSIS — R195 Other fecal abnormalities: Secondary | ICD-10-CM | POA: Diagnosis not present

## 2023-10-26 DIAGNOSIS — F332 Major depressive disorder, recurrent severe without psychotic features: Secondary | ICD-10-CM | POA: Diagnosis not present

## 2023-10-26 DIAGNOSIS — Z888 Allergy status to other drugs, medicaments and biological substances status: Secondary | ICD-10-CM | POA: Diagnosis not present

## 2023-10-26 DIAGNOSIS — Z79899 Other long term (current) drug therapy: Secondary | ICD-10-CM | POA: Diagnosis not present

## 2023-10-26 LAB — COMPREHENSIVE METABOLIC PANEL
ALT: 26 U/L (ref 0–44)
AST: 14 U/L — ABNORMAL LOW (ref 15–41)
Albumin: 3.4 g/dL — ABNORMAL LOW (ref 3.5–5.0)
Alkaline Phosphatase: 48 U/L (ref 38–126)
Anion gap: 7 (ref 5–15)
BUN: 10 mg/dL (ref 6–20)
CO2: 26 mmol/L (ref 22–32)
Calcium: 8.7 mg/dL — ABNORMAL LOW (ref 8.9–10.3)
Chloride: 105 mmol/L (ref 98–111)
Creatinine, Ser: 1.05 mg/dL (ref 0.61–1.24)
GFR, Estimated: >60 mL/min (ref >60)
Glucose, Bld: 104 mg/dL — ABNORMAL HIGH (ref 70–99)
Potassium: 3.5 mmol/L (ref 3.5–5.1)
Sodium: 138 mmol/L (ref 135–145)
Total Bilirubin: 0.7 mg/dL (ref <1.2)
Total Protein: 6 g/dL — ABNORMAL LOW (ref 6.5–8.1)

## 2023-10-26 LAB — CBC
HCT: 46.1 % (ref 39.0–52.0)
Hemoglobin: 15.9 g/dL (ref 13.0–17.0)
MCH: 29.3 pg (ref 26.0–34.0)
MCHC: 34.5 g/dL (ref 30.0–36.0)
MCV: 84.9 fL (ref 80.0–100.0)
Platelets: 232 10*3/uL (ref 150–400)
RBC: 5.43 MIL/uL (ref 4.22–5.81)
RDW: 12.9 % (ref 11.5–15.5)
WBC: 11.8 10*3/uL — ABNORMAL HIGH (ref 4.0–10.5)
nRBC: 0 % (ref 0.0–0.2)

## 2023-10-26 LAB — OCCULT BLOOD, POC DEVICE: Fecal Occult Bld: POSITIVE — AB

## 2023-10-26 LAB — ABO/RH: ABO/RH(D): A POS

## 2023-10-26 MED ORDER — HYDROMORPHONE HCL 1 MG/ML IJ SOLN
0.5000 mg | Freq: Four times a day (QID) | INTRAMUSCULAR | Status: DC | PRN
  Administered 2023-10-26 – 2023-10-28 (×7): 0.5 mg via INTRAVENOUS
  Filled 2023-10-26 (×7): qty 0.5

## 2023-10-26 MED ORDER — CIPROFLOXACIN IN D5W 400 MG/200ML IV SOLN
400.0000 mg | Freq: Two times a day (BID) | INTRAVENOUS | Status: DC
  Administered 2023-10-26 – 2023-10-28 (×4): 400 mg via INTRAVENOUS
  Filled 2023-10-26 (×5): qty 200

## 2023-10-26 MED ORDER — METRONIDAZOLE 500 MG/100ML IV SOLN
500.0000 mg | Freq: Two times a day (BID) | INTRAVENOUS | Status: DC
  Administered 2023-10-26 – 2023-10-28 (×4): 500 mg via INTRAVENOUS
  Filled 2023-10-26 (×4): qty 100

## 2023-10-26 MED ORDER — HYDRALAZINE HCL 20 MG/ML IJ SOLN: 10.0000 mg | Freq: Four times a day (QID) | INTRAMUSCULAR | Status: DC | PRN

## 2023-10-26 MED ORDER — HYDROMORPHONE HCL 1 MG/ML IJ SOLN
0.5000 mg | Freq: Once | INTRAMUSCULAR | Status: AC
  Administered 2023-10-26: 0.5 mg via INTRAVENOUS
  Filled 2023-10-26: qty 0.5

## 2023-10-26 NOTE — Progress Notes (Signed)
Progress Note   Patient: Robert Mcpherson ZOX:[id number] DOB: 10-May-1976 DOA: 10/25/2023     0 DOS: the patient was seen and examined on 10/26/2023   Brief hospital course: Robert Mcpherson is a 47 y.o. male with medical history significant of hyperlipidemia, anxiety, depression, OCD, OSA on CPAP, status post microdiscectomy, tonsillectomy presenting with possible GI bleed. CT abdomen pelvis showed moderate nonspecific sigmoid colitis with no evidence of abscess or other complication GI was consulted for evaluation.  Assessment and Plan:  # Colitis # GI bleed -Continues to have coffee-ground stools -Hemoglobin remained stable at 15.9, leukocytosis trending down -Etiology still unclear but concerning for infectious colitis -GI following, no plan for endoscopic evaluation at this time, will likely need outpatient colonoscopy in 6 to 8 weeks. -Patient continues to have some nausea but willing to advance diet -Will start IV Cipro and Flagyl -Pain control with as needed IV Dilaudid -Follow-up GI panel -Trend CBC, fever curve  # HTN -BP continues to be elevated with SBP in the 160s to 180s.  -Likely secondary to abdominal pain.   -He denies any diagnosis of hypertension.   -Will hold off on starting antihypertensive for now until pain is better controlled. -IV hydralazine 10 mg q6h for SBP >180   # Anxiety 3 OCD -Continue home sertraline and clonazepam   # OSA - Continue home CPAP      Subjective: Patient evaluated at the bedside with spouse and son in the room. He reports improvement in his abdominal pain with IV Dilaudid. He continues to have coffee-ground stools and mild nausea.  His BP continues to be elevated.  Physical Exam: Vitals:   10/26/23 0024 10/26/23 0533 10/26/23 0750 10/26/23 1132  BP: (!) 161/104 (!) 181/114 (!) 172/118 (!) 168/113  Pulse: 81 74 81 79  Resp:   17   Temp: 98 F (36.7 C) 98.8 F (37.1 C) 99.4 F (37.4 C) 99 F (37.2 C)  TempSrc: Oral Oral Oral  Oral  SpO2: 97% 97% 99% 96%  Weight:      Height:       General: Pleasant, well-appearing obese middle-age man laying in bed. No acute distress. HEENT: [place]/AT. Anicteric sclera.  MMM. CV: RRR. No murmurs, rubs, or gallops. No LE edema Pulmonary: Lungs CTAB. Normal effort. No wheezing or rales. Abdominal: Soft, nondistended. Mild tenderness to palpation of the lower abdomen, L>R. Normal bowel sounds. Extremities: Palpable radial and DP pulses. Normal ROM. Skin: Warm and dry. No obvious rash or lesions. Neuro: A&Ox3. Moves all extremities. Normal sensation to light touch. No focal deficit. Psych: Normal mood and affect  Data Reviewed: Pertinent Labs:    Latest Ref Rng & Units 10/26/2023    6:17 AM 10/25/2023    8:38 PM 10/25/2023   12:55 PM  CBC  WBC 4.0 - 10.5 K/uL 11.8  13.5  15.3   Hemoglobin 13.0 - 17.0 g/dL 81.1  91.4  78.2   Hematocrit 39.0 - 52.0 % 46.1  46.8  52.1   Platelets 150 - 400 K/uL 232  281  326        Latest Ref Rng & Units 10/26/2023    6:17 AM 10/25/2023   12:55 PM 08/02/2023   10:51 AM  CMP  Glucose 70 - 99 mg/dL 956  213  80   BUN 6 - 20 mg/dL 10  11  16    Creatinine 0.61 - 1.24 mg/dL 0.86  5.78  4.69   Sodium 135 - 145 mmol/L 138  137  141   Potassium 3.5 - 5.1 mmol/L 3.5  3.6  4.3   Chloride 98 - 111 mmol/L 105  102  104   CO2 22 - 32 mmol/L 26  23  27    Calcium 8.9 - 10.3 mg/dL 8.7  9.2  9.5   Total Protein 6.5 - 8.1 g/dL 6.0  7.0  6.8   Total Bilirubin <1.2 mg/dL 0.7  1.0  0.4   Alkaline Phos 38 - 126 U/L 48  59    AST 15 - 41 U/L 14  18  21    ALT 0 - 44 U/L 26  33  41      Family Communication: Discussed plan with spouse at bedside  Disposition: Status is: Inpatient Remains inpatient appropriate because: Ongoing management of the abdominal pain and GI bleed  Planned Discharge Destination: Barriers to discharge: Pain control and GI bleed    Time spent: 35 minutes  Author: Steffanie Rainwater, MD 10/26/2023 6:05 PM  For on call  review [email or web address].

## 2023-10-26 NOTE — Consult Note (Signed)
Consultation  Referring Provider:   Emergency room Primary Care Physician:  Donita Brooks, MD Primary Gastroenterologist:  Gentry Fitz       Reason for Consultation:   Diarrhea, positive, abdominal pain DOA: 10/25/2023         Hospital Day: 2         HPI:   Robert Mcpherson is a 46 y.o. male with past medical history significant for hyperlipidemia, anxiety, depression, OCD, OSA on CPAP, history of tonsillectomy 10/20/2023.   Presents to the ER with complaints of coffee-ground diarrhea 24 hours after tonsillectomy, having diarrhea every 10 to 15 minutes.   Work up notable for  Elevated systolic blood pressure 140s to 160s otherwise HD stable CBC mild leukocytosis 15.3 hemoglobin stable at 17.8,( testosterone and CPAP) FOBT positive CRP elevated at 4, sed rate negative GI pathogen pending CT abdomen pelvis moderate nonspecific sigmoid colitis, no evidence of abscess or other complication  No family was present at the time of my evaluation. Patient states never had a thing like this before denies any family history of colon cancer, autoimmune such as UC/Crohn's.  Had a tonsillectomy 12/12 and has been on liquid diet since that time was also on liquid hydrocodone with worsening constipation of his stools the last several days with bowel movement every other day small volume.  Patient stopped liquid hydrocodone 23 21st and started on ibuprofen/Tylenol. Started having abdominal cramping left lower quadrant with small volume dark coffee ground stools/blood every 15 to 20 minutes on the 22nd at noon had some associated chills, headache nausea denies fever, vomiting. Patient had some associated weight loss with tonsillectomy.  Denies GERD, dysphagia.  Denies alcohol, drug use, smoking.  Since being here patient's had continuing left lower quadrant abdominal discomfort coffee-ground, dark red stools/blood small-volume every 20 to 30 minutes last time was 830 this morning  Abnormal ED  labs: Abnormal Labs Reviewed  COMPREHENSIVE METABOLIC PANEL - Abnormal; Notable for the following components:      Result Value   Glucose, Bld 114 (*)    All other components within normal limits  CBC - Abnormal; Notable for the following components:   WBC 15.3 (*)    RBC 6.12 (*)    Hemoglobin 17.8 (*)    HCT 52.1 (*)    All other components within normal limits  C-REACTIVE PROTEIN - Abnormal; Notable for the following components:   CRP 4.0 (*)    All other components within normal limits  COMPREHENSIVE METABOLIC PANEL - Abnormal; Notable for the following components:   Glucose, Bld 104 (*)    Calcium 8.7 (*)    Total Protein 6.0 (*)    Albumin 3.4 (*)    AST 14 (*)    All other components within normal limits  CBC - Abnormal; Notable for the following components:   WBC 13.5 (*)    All other components within normal limits  CBC - Abnormal; Notable for the following components:   WBC 11.8 (*)    All other components within normal limits  OCCULT BLOOD, POC DEVICE - Abnormal; Notable for the following components:   Fecal Occult Bld POSITIVE (*)    All other components within normal limits    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 Family History:  His family history includes Heart disease (age of onset: 59) in his maternal uncle; Heart disease (age of onset:  40) in his maternal uncle. Social History:   reports that he has never smoked. He has never used smokeless tobacco. He reports that he does not currently use alcohol. He reports that he does not currently use drugs after having used the following drugs: Marijuana.  Prior to Admission medications   Medication Sig Start Date End Date Taking? Authorizing Provider  clonazePAM (KLONOPIN) 1 MG tablet Take 1 tablet (1 mg total) by mouth 3 (three) times daily as needed. for anxiety Patient taking differently: Take 1 mg by mouth every evening. for anxiety 10/06/23  Yes Dimas Aguas, Joice Lofts S, FNP  diphenhydrAMINE (BENADRYL) 25 MG tablet Take 25 mg by mouth at bedtime.   Yes [provider]  EPINEPHrine 0.3 mg/0.3 mL IJ SOAJ injection Inject 0.3 mg into the muscle as needed for anaphylaxis. 07/22/21  Yes Donita Brooks, MD  sertraline (ZOLOFT) 50 MG tablet Take 2 tablets (100 mg total) by mouth daily. Decrease venlafaxine as directed and gradually wean up on zoloft Patient taking differently: Take 50 mg by mouth daily. 03/30/23  Yes Donita Brooks, MD  Testosterone 20.25 MG/ACT (1.62%) GEL Place 2 Pump onto the skin daily. 08/05/23  Yes Donita Brooks, MD  HYDROcodone-acetaminophen (HYCET) 7.5-325 mg/15 ml solution Take 15 mLs by mouth every 4 (four) hours as needed for moderate pain (pain score 4-6). Patient not taking: Reported on 10/25/2023 10/15/23   Christia Reading, MD    Current Facility-Administered Medications  Medication Dose Route Frequency Provider Last Rate Last Admin   acetaminophen (TYLENOL) tablet 650 mg  650 mg Oral Q6H PRN Synetta Fail, MD   650 mg at 10/26/23 0235   Or   acetaminophen (TYLENOL) suppository 650 mg  650 mg Rectal Q6H PRN Synetta Fail, MD       clonazePAM Scarlette Calico) tablet 1 mg  1 mg Oral TID PRN Synetta Fail, MD   1 mg at 10/25/23 2043   melatonin tablet 5 mg  5 mg Oral QHS PRN Luiz Iron, NP   5 mg at 10/25/23 2244   pantoprazole  (PROTONIX) injection 40 mg  40 mg Intravenous Q12H Synetta Fail, MD   40 mg at 10/26/23 0856   sertraline (ZOLOFT) tablet 50 mg  50 mg Oral Daily Synetta Fail, MD   50 mg at 10/26/23 0856   sodium chloride flush (NS) 0.9 % injection 3 mL  3 mL Intravenous Q12H Synetta Fail, MD   3 mL at 10/26/23 0859    Allergies as of 10/25/2023 - Review Complete 10/25/2023  Allergen Reaction Noted   Cephalexin Hives 06/06/2013    Review of Systems:    Constitutional: No weight loss, fever, chills, weakness or fatigue HEENT: Eyes: No change in vision               Ears, Nose, Throat:  No change in hearing or congestion Skin: No rash or itching Cardiovascular: No chest pain, chest pressure or palpitations  Respiratory: No SOB or cough Gastrointestinal: See HPI and otherwise negative Genitourinary: No dysuria or change in urinary frequency Neurological: No headache, dizziness or syncope Musculoskeletal: No new muscle or joint pain Hematologic: No bleeding or bruising Psychiatric: No history of depression or anxiety     Physical Exam:  Vital signs in last 24 hours: Temp:  [98 F (36.7 C)-99.4 F (37.4 C)] 99.4 F (37.4 C) (12/24 0750) Pulse Rate:  [74-100] 81 (12/24 0750) Resp:  [10-22] 17 (12/24 0750) BP: (148-181)/(95-126) 172/118 (12/24 0750) SpO2:  [95 %-99 %] 99 % (12/24 0750) FiO2 (%):  [0 %] 0 % (12/23 1720) Weight:  [244.0 kg] 118.8 kg (12/23 1251) Last BM Date : 10/25/23 Last BM recorded by nurses in past 5 days No data recorded  General:   Pleasant, well developed male in no acute distress Head:  Normocephalic and atraumatic. Eyes: sclerae anicteric,conjunctive pink  Heart:  regular rate and rhythm, no murmurs or gallops Pulm: Clear anteriorly; no wheezing Abdomen:  Soft, Obese AB, Active bowel sounds. moderate tenderness in the LLQ. With guarding and Without rebound, No organomegaly appreciated. Extremities:  Without edema. Msk:  Symmetrical without gross  deformities. Peripheral pulses intact.  Neurologic:  Alert and  oriented x4;  No focal deficits.  Skin:   Dry and intact without significant lesions or rashes. Psychiatric:  Cooperative. Normal mood and affect.  LAB RESULTS: Recent Labs    10/25/23 1255 10/25/23 2038 10/26/23 0617  WBC 15.3* 13.5* 11.8*  HGB 17.8* 16.1 15.9  HCT 52.1* 46.8 46.1  PLT 326 281 232   BMET Recent Labs    10/25/23 1255 10/26/23 0617  NA 137 138  K 3.6 3.5  CL 102 105  CO2 23 26  GLUCOSE 114* 104*  BUN 11 10  CREATININE 1.10 1.05  CALCIUM 9.2 8.7*   LFT Recent Labs    10/26/23 0617  PROT 6.0*  ALBUMIN 3.4*  AST 14*  ALT 26  ALKPHOS 48  BILITOT 0.7   PT/INR No results for input(s): "LABPROT", "INR" in the last 72 hours.  STUDIES: CT ABDOMEN PELVIS W CONTRAST Result Date: 10/25/2023 CLINICAL DATA:  Acute non localized abdominal pain. EXAM: CT ABDOMEN AND PELVIS WITH CONTRAST TECHNIQUE: Multidetector CT imaging of the abdomen and pelvis was performed using the standard protocol following bolus administration of intravenous contrast. RADIATION DOSE REDUCTION: This exam was performed according to the departmental dose-optimization program which includes automated exposure control, adjustment of the mA and/or kV according to patient size and/or use of iterative reconstruction technique. CONTRAST:  75mL OMNIPAQUE IOHEXOL 350 MG/ML SOLN COMPARISON:  Noncontrast CT on 04/25/2021 FINDINGS: Lower Chest: No acute findings. Hepatobiliary: No suspicious hepatic masses identified. Gallbladder is unremarkable. No evidence of biliary ductal dilatation. Pancreas:  No mass or inflammatory changes. Spleen: Within normal limits in size and appearance. Adrenals/Urinary Tract: No suspicious masses identified. No evidence of ureteral calculi or hydronephrosis. Stomach/Bowel: Moderate wall thickening is seen involving the sigmoid colon with pericolonic inflammatory changes. No definite diverticular changes are seen,  and this is consistent with nonspecific colitis. No evidence of abscess or free fluid. Vascular/Lymphatic: No pathologically enlarged lymph nodes. No acute vascular findings. Reproductive:  No mass or other significant abnormality. Other:  None. Musculoskeletal:  No suspicious bone lesions identified. IMPRESSION: Moderate nonspecific sigmoid colitis. No evidence of abscess or other complication. Electronically Signed   By: Danae Orleans M.D.   On: 10/25/2023 15:45      Impression/Plan:   47 year old male with history  of OSA, hyperlipidemia, recent tonsillectomy 12/12 with subsequent constant patient from pain medications presents with sudden onset left lower quadrant abdominal pain with dark red/black small-volume frequent bowel movements with associated chills, nausea and headache. Associated leukocytosis, afebrile, FOBT positive, CRP 4 CT with moderate nonspecific sigmoid colitis no complication Pending GI pathogen Most likely this represents infectious colitis, potential ischemic colitis with recent surgery/constipation however location is less likely to be ischemic, less likely inflammatory with acute onset. -No plans for endoscopic evaluation at this time, continue supportive care -Follow GI pathogen -Supportive care with pain control, IV fluids -Can advance to full liquid diet, advance diet as tolerated -Afebrile, WBC downtrending with IVF, can consider Cipro Flagyl -If patient fails to improve for any concerning features can consider colonoscopy this visit but most likely will plan for outpatient follow-up with colonoscopy in 6 to 8 weeks  Principal Problem:   Colitis Active Problems:   Anxiety   OCD (obsessive compulsive disorder)   Obstructive sleep apnea treated with continuous positive airway pressure (CPAP)   HLD (hyperlipidemia)   Severe recurrent major depression without psychotic features (HCC)   GI bleed    LOS: 0 days  Thank you for your kind consultation, we will  continue to follow.   Doree Albee  10/26/2023, 9:52 AM

## 2023-10-26 NOTE — Progress Notes (Signed)
   10/26/23 2300  BiPAP/CPAP/SIPAP  Reason BIPAP/CPAP not in use Non-compliant

## 2023-10-26 NOTE — Hospital Course (Signed)
Robert Mcpherson is a 47 y.o. male with medical history significant of hyperlipidemia, anxiety, depression, OCD, OSA on CPAP, status post microdiscectomy, tonsillectomy presenting with possible GI bleed. CT abdomen pelvis showed moderate nonspecific sigmoid colitis with no evidence of abscess or other complication GI was consulted for evaluation.

## 2023-10-26 NOTE — Plan of Care (Signed)
  Problem: Pain Management: Goal: General experience of comfort will improve Outcome: Progressing   Problem: Safety: Goal: Ability to remain free from injury will improve Outcome: Progressing

## 2023-10-26 NOTE — H&P (Incomplete)
History and Physical    Patient: Robert Mcpherson EAV:[id number] DOB: [birth year] DOA: 10/25/2023 DOS: the patient was seen and examined on 10/26/2023 PCP: Donita Brooks, MD  Patient coming from: {Point_of_Origin:[address]}  Chief Complaint:  Chief Complaint  Patient presents with   GI Bleeding   HPI: Robert Mcpherson is a 47 y.o. 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   Nasal turbinate hypertrophy 09/2013   OCD (obsessive compulsive disorder) 04/02/2014   Sensation of fullness in right ear 09/13/2018   Sleep apnea    Snoring 09/13/2018   Past Surgical History:  Procedure Laterality Date   APPENDECTOMY     LUMBAR LAMINECTOMY/DECOMPRESSION MICRODISCECTOMY Left 12/02/2017   Procedure: Left Lumbar 4-5 disectomy;  Surgeon: Venita Lick, MD;  Location: [facility] OR;  Service: Orthopedics;  Laterality: Left;  2 hrs   NASAL SEPTOPLASTY W/ TURBINOPLASTY Bilateral 10/02/2013   Procedure: BILATERAL NASAL SEPTOPLASTY WITH TURBINATE REDUCTION;  Surgeon: Serena Colonel, MD;  Location: [facility];  Service: ENT;  Laterality: Bilateral;   SHOULDER ARTHROSCOPY Right 11/2013   TONSILLECTOMY Bilateral 10/14/2023   Procedure: TONSILLECTOMY;  Surgeon: Christia Reading, MD;  Location: [facility] OR;  Service: ENT;  Laterality: Bilateral;   VASECTOMY  06/02/2013   Social History:  reports that he has never smoked. He has never used smokeless tobacco. He reports that he does not currently use alcohol. He reports that he does not currently use drugs after having used the following drugs: Marijuana.  Allergies  Allergen Reactions   Cephalexin Hives    Family History  Problem Relation Age of Onset   Heart disease Maternal Uncle 52       heart attack   Heart disease Maternal Uncle 26        CVA   Colon cancer Neg Hx    Colon polyps Neg Hx    Liver disease Neg Hx     Prior to Admission medications   Medication Sig Start Date End Date Taking? Authorizing Provider  clonazePAM (KLONOPIN) 1 MG tablet Take 1 tablet (1 mg total) by mouth 3 (three) times daily as needed. for anxiety Patient taking differently: Take 1 mg by mouth every evening. for anxiety 10/06/23  Yes Dimas Aguas, Joice Lofts S, FNP  diphenhydrAMINE (BENADRYL) 25 MG tablet Take 25 mg by mouth at bedtime.   Yes [provider]  EPINEPHrine 0.3 mg/0.3 mL IJ SOAJ injection Inject 0.3 mg into the muscle as needed for anaphylaxis. 07/22/21  Yes Donita Brooks, MD  sertraline (ZOLOFT) 50 MG tablet Take 2 tablets (100 mg total) by mouth daily. Decrease venlafaxine as directed and gradually wean up on zoloft Patient taking differently: Take 50 mg by mouth daily. 03/30/23  Yes Donita Brooks, MD  Testosterone 20.25 MG/ACT (1.62%) GEL Place 2 Pump onto the skin daily. 08/05/23  Yes Donita Brooks, MD  HYDROcodone-acetaminophen (HYCET) 7.5-325 mg/15 ml solution Take 15 mLs by mouth every 4 (four) hours as needed for moderate pain (pain score 4-6). Patient not taking: Reported on 10/25/2023 10/15/23   Christia Reading, MD    Physical Exam: Vitals:   10/26/23 0024 10/26/23 0533 10/26/23 0750 10/26/23 1132  BP: (!) 161/104 (!) 181/114 (!) 172/118 (!) 168/113  Pulse: 81 74 81  79  Resp:   17   Temp: 98 F (36.7 C) 98.8 F (37.1 C) 99.4 F (37.4 C) 99 F (37.2 C)  TempSrc: Oral Oral Oral Oral  SpO2: 97% 97% 99% 96%  Weight:      Height:       *** Data Reviewed: {Tip this will not be part of the note when signed- Document your independent interpretation of telemetry tracing, EKG, lab, Radiology test or any other diagnostic tests. Add any new diagnostic test ordered today. (Optional):26781} {Results:26384}  Assessment and Plan: No notes have been filed under this hospital service. Service: Hospitalist     Advance  Care Planning:   Code Status: Full Code ***  Consults: ***  Family Communication: ***  Severity of Illness: {Observation/Inpatient:21159}  Author: Steffanie Rainwater, MD 10/26/2023 5:47 PM  For on call review [email or web address].

## 2023-10-26 NOTE — Progress Notes (Signed)
Pharmacy Antibiotic Note  Robert Mcpherson is a 47 y.o. male admitted on 10/25/2023 with  IAI colitis .  Pharmacy has been consulted for cipro dosing.  Plan: Cipro 400 mg iv q12h  Height: 5\' 9"  (175.3 cm) Weight: 118.8 kg (261 lb 14.5 oz) IBW/kg (Calculated) : 70.7  Temp (24hrs), Avg:98.8 F (37.1 C), Min:98 F (36.7 C), Max:99.4 F (37.4 C)  Recent Labs  Lab 10/25/23 1255 10/25/23 2038 10/26/23 0617  WBC 15.3* 13.5* 11.8*  CREATININE 1.10  --  1.05    Estimated Creatinine Clearance: 111.8 mL/min (by C-G formula based on SCr of 1.05 mg/dL).    Allergies  Allergen Reactions   Cephalexin Hives     Thank you for allowing pharmacy to be a part of this patient's care.  Greta Doom BS, PharmD, BCPS Clinical Pharmacist 10/26/2023 2:50 PM  Contact: [phone] after 3 PM  "Be curious, not judgmental..." -Debbora Dus

## 2023-10-27 DIAGNOSIS — R195 Other fecal abnormalities: Secondary | ICD-10-CM

## 2023-10-27 DIAGNOSIS — A09 Infectious gastroenteritis and colitis, unspecified: Secondary | ICD-10-CM | POA: Diagnosis not present

## 2023-10-27 DIAGNOSIS — K529 Noninfective gastroenteritis and colitis, unspecified: Secondary | ICD-10-CM | POA: Diagnosis not present

## 2023-10-27 LAB — CBC
HCT: 44.3 % (ref 39.0–52.0)
Hemoglobin: 15.3 g/dL (ref 13.0–17.0)
MCH: 29.2 pg (ref 26.0–34.0)
MCHC: 34.5 g/dL (ref 30.0–36.0)
MCV: 84.5 fL (ref 80.0–100.0)
Platelets: 243 10*3/uL (ref 150–400)
RBC: 5.24 MIL/uL (ref 4.22–5.81)
RDW: 12.8 % (ref 11.5–15.5)
WBC: 10.3 10*3/uL (ref 4.0–10.5)
nRBC: 0 % (ref 0.0–0.2)

## 2023-10-27 LAB — BASIC METABOLIC PANEL
Anion gap: 10 (ref 5–15)
BUN: 10 mg/dL (ref 6–20)
CO2: 26 mmol/L (ref 22–32)
Calcium: 8.8 mg/dL — ABNORMAL LOW (ref 8.9–10.3)
Chloride: 103 mmol/L (ref 98–111)
Creatinine, Ser: 0.98 mg/dL (ref 0.61–1.24)
GFR, Estimated: >60 mL/min (ref >60)
Glucose, Bld: 100 mg/dL — ABNORMAL HIGH (ref 70–99)
Potassium: 3.5 mmol/L (ref 3.5–5.1)
Sodium: 139 mmol/L (ref 135–145)

## 2023-10-27 NOTE — Progress Notes (Signed)
[facility] ROUNDING NOTE   Subjective: Patient feeling better this morning.  Pain less severe.  Diarrhea much improved.  When I saw him late this morning, he had not had a bowel movement since before dinner yesterday.  He tolerated a regular diet yesterday.   Objective: Vital signs in last 24 hours: Temp:  [98.1 F (36.7 C)-98.4 F (36.9 C)] 98.1 F (36.7 C) (12/25 0515) Pulse Rate:  [70-90] 70 (12/25 0515) Resp:  [17] 17 (12/25 0515) BP: (142-147)/(96-98) 147/96 (12/25 0515) SpO2:  [97 %] 97 % (12/25 0515) Last BM Date : 10/26/23 General: NAD, pleasant Caucasian male, lying in bed Lungs:  CTA b/l, no w/r/r Heart:  RRR, no m/r/g Abdomen:  Soft, tenderness to palpation in left lower quadrant, no rigidity or guarding, ND, +BS    Intake/Output from previous day: 12/24 0701 - 12/25 0700 In: 900.1 [P.O.:600; IV Piggyback:300.1] Out: -  Intake/Output this shift: Total I/O In: 240 [P.O.:240] Out: -    Lab Results: Recent Labs    10/25/23 2038 10/26/23 0617 10/27/23 0537  WBC 13.5* 11.8* 10.3  HGB 16.1 15.9 15.3  PLT 281 232 243  MCV 85.1 84.9 84.5   BMET Recent Labs    10/25/23 1255 10/26/23 0617 10/27/23 0537  NA 137 138 139  K 3.6 3.5 3.5  CL 102 105 103  CO2 23 26 26   GLUCOSE 114* 104* 100*  BUN 11 10 10   CREATININE 1.10 1.05 0.98  CALCIUM 9.2 8.7* 8.8*   LFT Recent Labs    10/25/23 1255 10/26/23 0617  PROT 7.0 6.0*  ALBUMIN 3.9 3.4*  AST 18 14*  ALT 33 26  ALKPHOS 59 48  BILITOT 1.0 0.7   PT/INR No results for input(s): "INR" in the last 72 hours.    Imaging/Other results: CT ABDOMEN PELVIS W CONTRAST Result Date: 10/25/2023 CLINICAL DATA:  Acute non localized abdominal pain. EXAM: CT ABDOMEN AND PELVIS WITH CONTRAST TECHNIQUE: Multidetector CT imaging of the abdomen and pelvis was performed using the standard protocol following bolus administration of intravenous contrast. RADIATION DOSE REDUCTION: This exam was performed  according to the departmental dose-optimization program which includes automated exposure control, adjustment of the mA and/or kV according to patient size and/or use of iterative reconstruction technique. CONTRAST:  75mL OMNIPAQUE IOHEXOL 350 MG/ML SOLN COMPARISON:  Noncontrast CT on 04/25/2021 FINDINGS: Lower Chest: No acute findings. Hepatobiliary: No suspicious hepatic masses identified. Gallbladder is unremarkable. No evidence of biliary ductal dilatation. Pancreas:  No mass or inflammatory changes. Spleen: Within normal limits in size and appearance. Adrenals/Urinary Tract: No suspicious masses identified. No evidence of ureteral calculi or hydronephrosis. Stomach/Bowel: Moderate wall thickening is seen involving the sigmoid colon with pericolonic inflammatory changes. No definite diverticular changes are seen, and this is consistent with nonspecific colitis. No evidence of abscess or free fluid. Vascular/Lymphatic: No pathologically enlarged lymph nodes. No acute vascular findings. Reproductive:  No mass or other significant abnormality. Other:  None. Musculoskeletal:  No suspicious bone lesions identified. IMPRESSION: Moderate nonspecific sigmoid colitis. No evidence of abscess or other complication. Electronically Signed   By: Danae Orleans M.D.   On: 10/25/2023 15:45      Assessment and Plan:  47 year old male with abrupt onset abdominal pain and coffee-ground colored diarrhea, with evidence of left-sided colitis on CT scan.  Inflammatory markers elevated, hemoglobin stable, leukocytosis on presentation, now improving with empiric antibiotics. Presentation most consistent with acute infectious colitis.  Symptoms improving with supportive care and empiric antibiotics.  Expect  symptoms will continue to improve and resolve completely.  We did discuss the possibility of postinfectious IBS, and discussed the symptoms to look out for. The patient needs a colonoscopy for colon cancer screening and given  positive FOBT.  We will arrange for this as outpatient. Okay to discharge home from GI standpoint.  Would complete 5-day course of Cipro Flagyl.  Await results of stool test to possibly identify culprit pathogen.  Acute infectious colitis - Okay to discharge home from GI standpoint - Can transition to p.o. antibiotics for 5-day course - Continue regular diet  Colon cancer screening/positive FOBT - Will arrange outpatient colonoscopy  GI will sign off at this time.    Jenel Lucks, MD  10/27/2023, 2:30 PM [facility]

## 2023-10-27 NOTE — Progress Notes (Signed)
   10/27/23 2159  BiPAP/CPAP/SIPAP  Reason BIPAP/CPAP not in use Non-compliant (pt refused. States he cant tolerate the mask on his face)

## 2023-10-27 NOTE — Plan of Care (Signed)
  Problem: Pain Management: Goal: General experience of comfort will improve Outcome: Progressing   Problem: Safety: Goal: Ability to remain free from injury will improve Outcome: Progressing

## 2023-10-27 NOTE — Progress Notes (Signed)
PROGRESS NOTE    MEET Robert Mcpherson  YQM:[id number] DOB: 03-Jul-1976 DOA: 10/25/2023 PCP: Donita Brooks, MD  Chief Complaint  Patient presents with   GI Bleeding    Hospital Course:  Rajat SHANARD DUTTA is 47 y.o. male with lipidemia, anxiety, depression, OCD, OSA on CPAP, status post microdiscectomy, status post tonsillectomy, presents with GI bleed.  CT abdomen pelvis shows moderate nonspecific sigmoid colitis with no evidence of abscess or complication.  GI was consulted for evaluation.  Subjective:  RN reports frank red bleeding in stool yesterday evening.  Patient reports his abdominal pain is improving significantly.  He has not had a bowel movement yet today.  He is tolerating regular diet.   Objective: Vitals:   10/26/23 0750 10/26/23 1132 10/26/23 2124 10/27/23 0515  BP: (!) 172/118 (!) 168/113 (!) 142/98 (!) 147/96  Pulse: 81 79 90 70  Resp: 17  17 17   Temp: 99.4 F (37.4 C) 99 F (37.2 C) 98.4 F (36.9 C) 98.1 F (36.7 C)  TempSrc: Oral Oral Oral Oral  SpO2: 99% 96% 97% 97%  Weight:      Height:        Intake/Output Summary (Last 24 hours) at 10/27/2023 5284 Last data filed at 10/27/2023 0300 Gross per 24 hour  Intake 420.11 ml  Output --  Net 420.11 ml   Filed Weights   10/25/23 1251  Weight: 118.8 kg    Examination: General exam: Appears calm and comfortable, NAD  Respiratory system: No work of breathing, symmetric chest wall expansion Cardiovascular system: S1 & S2 heard, RRR.  Gastrointestinal system: Abdomen is nondistended, soft and nontender.  Neuro: Alert and oriented. No focal neurological deficits. Extremities: Symmetric, expected ROM Skin: No rashes, lesions Psychiatry: Demonstrates appropriate judgement and insight. Mood & affect appropriate for situation.   Assessment & Plan:  Principal Problem:   Colitis Active Problems:   Anxiety   OCD (obsessive compulsive disorder)   Obstructive sleep apnea treated with continuous positive airway  pressure (CPAP)   HLD (hyperlipidemia)   Severe recurrent major depression without psychotic features (HCC)   Acute GI bleeding    Colitis GI bleed - Mix of coffee-ground stools and frank red blood - Hemoglobin is very stable - GI consulted - On IV Cipro and Flagyl today. Plan to transition to PO in AM - As needed pain meds - Follow GI panel - Trend CBC and hemoglobin  Hypertension - Elevation is likely secondary to abdominal pain - Initiate antihypertensives as needed  Anxiety OCD - Continue home dose sertraline and clonazepam  OSA - CPAP at night   DVT prophylaxis: SCDs   Code Status: Full Code Family Communication: None at bedside, communicated directly with patient Disposition:  Status is: Inpatient, on IV antibiotics.  Will plan to transition to p.o. antibiotics tomorrow.  If hemoglobin remains stable will discharge home in the morning  Consultants:    Treatment Team:  Consulting Physician: Jenel Lucks, MD  Procedures:    Antimicrobials:  Anti-infectives (From admission, onward)    Start     Dose/Rate Route Frequency Ordered Stop   10/26/23 1800  metroNIDAZOLE (FLAGYL) IVPB 500 mg        500 mg 100 mL/hr over 60 Minutes Intravenous Every 12 hours 10/26/23 1423     10/26/23 1800  ciprofloxacin (CIPRO) IVPB 400 mg        400 mg 200 mL/hr over 60 Minutes Intravenous Every 12 hours 10/26/23 1449  Data Reviewed: I have personally reviewed following labs and imaging studies CBC: Recent Labs  Lab 10/25/23 1255 10/25/23 2038 10/26/23 0617 10/27/23 0537  WBC 15.3* 13.5* 11.8* 10.3  HGB 17.8* 16.1 15.9 15.3  HCT 52.1* 46.8 46.1 44.3  MCV 85.1 85.1 84.9 84.5  PLT 326 281 232 243   Basic Metabolic Panel: Recent Labs  Lab 10/25/23 1255 10/26/23 0617 10/27/23 0537  NA 137 138 139  K 3.6 3.5 3.5  CL 102 105 103  CO2 23 26 26   GLUCOSE 114* 104* 100*  BUN 11 10 10   CREATININE 1.10 1.05 0.98  CALCIUM 9.2 8.7* 8.8*   GFR: Estimated  Creatinine Clearance: 119.8 mL/min (by C-G formula based on SCr of 0.98 mg/dL). Liver Function Tests: Recent Labs  Lab 10/25/23 1255 10/26/23 0617  AST 18 14*  ALT 33 26  ALKPHOS 59 48  BILITOT 1.0 0.7  PROT 7.0 6.0*  ALBUMIN 3.9 3.4*   CBG: No results for input(s): "GLUCAP" in the last 168 hours.  No results found for this or any previous visit (from the past 240 hours).   Radiology Studies: CT ABDOMEN PELVIS W CONTRAST Result Date: 10/25/2023 CLINICAL DATA:  Acute non localized abdominal pain. EXAM: CT ABDOMEN AND PELVIS WITH CONTRAST TECHNIQUE: Multidetector CT imaging of the abdomen and pelvis was performed using the standard protocol following bolus administration of intravenous contrast. RADIATION DOSE REDUCTION: This exam was performed according to the departmental dose-optimization program which includes automated exposure control, adjustment of the mA and/or kV according to patient size and/or use of iterative reconstruction technique. CONTRAST:  75mL OMNIPAQUE IOHEXOL 350 MG/ML SOLN COMPARISON:  Noncontrast CT on 04/25/2021 FINDINGS: Lower Chest: No acute findings. Hepatobiliary: No suspicious hepatic masses identified. Gallbladder is unremarkable. No evidence of biliary ductal dilatation. Pancreas:  No mass or inflammatory changes. Spleen: Within normal limits in size and appearance. Adrenals/Urinary Tract: No suspicious masses identified. No evidence of ureteral calculi or hydronephrosis. Stomach/Bowel: Moderate wall thickening is seen involving the sigmoid colon with pericolonic inflammatory changes. No definite diverticular changes are seen, and this is consistent with nonspecific colitis. No evidence of abscess or free fluid. Vascular/Lymphatic: No pathologically enlarged lymph nodes. No acute vascular findings. Reproductive:  No mass or other significant abnormality. Other:  None. Musculoskeletal:  No suspicious bone lesions identified. IMPRESSION: Moderate nonspecific sigmoid  colitis. No evidence of abscess or other complication. Electronically Signed   By: Danae Orleans M.D.   On: 10/25/2023 15:45    Scheduled Meds:  pantoprazole (PROTONIX) IV  40 mg Intravenous Q12H   sertraline  50 mg Oral Daily   sodium chloride flush  3 mL Intravenous Q12H   Continuous Infusions:  ciprofloxacin 400 mg (10/27/23 0525)   metronidazole 500 mg (10/27/23 0514)     LOS: 1 day    Time spent:   Debarah Crape, DO [facility]  To contact the attending physician between 7A-7P please use Epic Chat. To contact the covering physician during after hours 7P-7A, please review Amion.   10/27/2023, 9:58 AM   *This document has been created with the assistance of dictation software. Please excuse typographical errors. *

## 2023-10-28 DIAGNOSIS — K529 Noninfective gastroenteritis and colitis, unspecified: Secondary | ICD-10-CM | POA: Diagnosis not present

## 2023-10-28 LAB — CBC WITH DIFFERENTIAL/PLATELET
Abs Immature Granulocytes: 0.05 10*3/uL (ref 0.00–0.07)
Basophils Absolute: 0.1 10*3/uL (ref 0.0–0.1)
Basophils Relative: 1 %
Eosinophils Absolute: 0.3 10*3/uL (ref 0.0–0.5)
Eosinophils Relative: 4 %
HCT: 44.8 % (ref 39.0–52.0)
Hemoglobin: 15.3 g/dL (ref 13.0–17.0)
Immature Granulocytes: 1 %
Lymphocytes Relative: 33 %
Lymphs Abs: 3 10*3/uL (ref 0.7–4.0)
MCH: 28.8 pg (ref 26.0–34.0)
MCHC: 34.2 g/dL (ref 30.0–36.0)
MCV: 84.4 fL (ref 80.0–100.0)
Monocytes Absolute: 0.6 10*3/uL (ref 0.1–1.0)
Monocytes Relative: 7 %
Neutro Abs: 5.1 10*3/uL (ref 1.7–7.7)
Neutrophils Relative %: 54 %
Platelets: 243 10*3/uL (ref 150–400)
RBC: 5.31 MIL/uL (ref 4.22–5.81)
RDW: 12.6 % (ref 11.5–15.5)
WBC: 9.1 10*3/uL (ref 4.0–10.5)
nRBC: 0 % (ref 0.0–0.2)

## 2023-10-28 LAB — COMPREHENSIVE METABOLIC PANEL
ALT: 33 U/L (ref 0–44)
AST: 21 U/L (ref 15–41)
Albumin: 3.4 g/dL — ABNORMAL LOW (ref 3.5–5.0)
Alkaline Phosphatase: 44 U/L (ref 38–126)
Anion gap: 11 (ref 5–15)
BUN: 13 mg/dL (ref 6–20)
CO2: 25 mmol/L (ref 22–32)
Calcium: 8.7 mg/dL — ABNORMAL LOW (ref 8.9–10.3)
Chloride: 106 mmol/L (ref 98–111)
Creatinine, Ser: 1.13 mg/dL (ref 0.61–1.24)
GFR, Estimated: >60 mL/min (ref >60)
Glucose, Bld: 104 mg/dL — ABNORMAL HIGH (ref 70–99)
Potassium: 3.8 mmol/L (ref 3.5–5.1)
Sodium: 142 mmol/L (ref 135–145)
Total Bilirubin: 0.5 mg/dL (ref <1.2)
Total Protein: 6.2 g/dL — ABNORMAL LOW (ref 6.5–8.1)

## 2023-10-28 LAB — GASTROINTESTINAL PANEL BY PCR, STOOL (REPLACES STOOL CULTURE)

## 2023-10-28 MED ORDER — CIPROFLOXACIN HCL 500 MG PO TABS: 500.0000 mg | ORAL_TABLET | Freq: Two times a day (BID) | ORAL | 0 refills | Status: AC

## 2023-10-28 NOTE — Discharge Summary (Signed)
Physician Discharge Summary   Patient: Robert Mcpherson MRN: [id number] DOB: August 02, 1976  Admit date:     10/25/2023  Discharge date: 10/28/23  Discharge Physician: Fran Lowes   PCP: Donita Brooks, MD   Recommendations at discharge:    Complete 5 days of oral Cipro. GI to contact patient to arrange outpatient colonoscopy. Pt to follow up with PCP in 7-10 days. CBC to be drawn on that visit to be reported to PCP.  Discharge Diagnoses: Principal Problem:   Colitis Active Problems:   Anxiety   OCD (obsessive compulsive disorder)   Obstructive sleep apnea treated with continuous positive airway pressure (CPAP)   HLD (hyperlipidemia)   Severe recurrent major depression without psychotic features (HCC)   Acute GI bleeding  Resolved Problems:   * No resolved hospital problems. [facility] Course: Robert Mcpherson is a 47 y.o. male with medical history significant of hyperlipidemia, anxiety, depression, OCD, OSA on CPAP, status post microdiscectomy, tonsillectomy presenting with possible GI bleed. CT abdomen pelvis showed moderate nonspecific sigmoid colitis with no evidence of abscess or other complication GI was consulted for evaluation. GI felt that no acute intervention was required. The patient's hemoglobin was monitored for another 24 hours. The patient will be discharged to home with a stable hemoglobin at 9.1. He will be contacted by GI for a outpatient colonoscopy and will have a follow up CBC drawn at this follow up visit with his PCP. The patient is cleared for discharge to home. Assessment and Plan:  Principal Problem:   Colitis Active Problems:   Anxiety   OCD (obsessive compulsive disorder)   Obstructive sleep apnea treated with continuous positive airway pressure (CPAP)   HLD (hyperlipidemia)   Severe recurrent major depression without psychotic features (HCC)   Acute GI bleeding       Colitis GI bleed - Mix of coffee-ground stools and frank red blood -  Hemoglobin is very stable - GI consulted - On IV Cipro and Flagyl today. Plan to transition to PO in AM - As needed pain meds - Follow GI panel - Trend CBC and hemoglobin   Hypertension - Elevation is likely secondary to abdominal pain - Initiate antihypertensives as needed   Anxiety OCD - Continue home dose sertraline and clonazepam   OSA - CPAP at night     DVT prophylaxis: SCDs   Code Status: Full Code Family Communication: None at bedside, communicated directly with patient Disposition:  Status is: Inpatient, on IV antibiotics.  Will plan to transition to p.o. antibiotics tomorrow.  If hemoglobin remains stable will discharge home in the morning   Consultants:    Treatment Team:  Consulting Physician: Jenel Lucks, MD  Discharge Diet Orders (From admission, onward)     Start     Ordered   10/28/23 0000  Diet - low sodium heart healthy        10/28/23 1325            DISCHARGE MEDICATION: Allergies as of 10/28/2023       Reactions   Cephalexin Hives        Medication List     TAKE these medications    ciprofloxacin 500 MG tablet Commonly known as: Cipro Take 1 tablet (500 mg total) by mouth 2 (two) times daily for 5 days.   clonazePAM 1 MG tablet Commonly known as: KLONOPIN Take 1 tablet (1 mg total) by mouth 3 (three) times daily as needed. for anxiety What changed: when to  take this   diphenhydrAMINE 25 MG tablet Commonly known as: BENADRYL Take 25 mg by mouth at bedtime.   EPINEPHrine 0.3 mg/0.3 mL Soaj injection Commonly known as: EPI-PEN Inject 0.3 mg into the muscle as needed for anaphylaxis.   sertraline 50 MG tablet Commonly known as: ZOLOFT Take 2 tablets (100 mg total) by mouth daily. Decrease venlafaxine as directed and gradually wean up on zoloft What changed:  how much to take additional instructions   Testosterone 20.25 MG/ACT (1.62%) Gel Place 2 Pump onto the skin daily.        Discharge Exam: Filed Weights    10/25/23 1251 10/28/23 0500  Weight: 118.8 kg 117 kg   Exam:  Constitutional:  The patient is awake, alert, and oriented x 3. No acute distress. Respiratory:  No increased work of breathing. No wheezes, rales, or rhonchi No tactile fremitus Cardiovascular:  Regular rate and rhythm No murmurs, ectopy, or gallups. No lateral PMI. No thrills. Abdomen:  Abdomen is soft, non-tender, non-distended No hernias, masses, or organomegaly Normoactive bowel sounds.  Musculoskeletal:  No cyanosis, clubbing, or edema Skin:  No rashes, lesions, ulcers palpation of skin: no induration or nodules Neurologic:  CN 2-12 intact Sensation all 4 extremities intact Psychiatric:  Mental status Mood, affect appropriate Orientation to person, place, time  judgment and insight appear intact   Condition at discharge: fair  The results of significant diagnostics from this hospitalization (including imaging, microbiology, ancillary and laboratory) are listed below for reference.   Imaging Studies: CT ABDOMEN PELVIS W CONTRAST Result Date: 10/25/2023 CLINICAL DATA:  Acute non localized abdominal pain. EXAM: CT ABDOMEN AND PELVIS WITH CONTRAST TECHNIQUE: Multidetector CT imaging of the abdomen and pelvis was performed using the standard protocol following bolus administration of intravenous contrast. RADIATION DOSE REDUCTION: This exam was performed according to the departmental dose-optimization program which includes automated exposure control, adjustment of the mA and/or kV according to patient size and/or use of iterative reconstruction technique. CONTRAST:  75mL OMNIPAQUE IOHEXOL 350 MG/ML SOLN COMPARISON:  Noncontrast CT on 04/25/2021 FINDINGS: Lower Chest: No acute findings. Hepatobiliary: No suspicious hepatic masses identified. Gallbladder is unremarkable. No evidence of biliary ductal dilatation. Pancreas:  No mass or inflammatory changes. Spleen: Within normal limits in size and appearance.  Adrenals/Urinary Tract: No suspicious masses identified. No evidence of ureteral calculi or hydronephrosis. Stomach/Bowel: Moderate wall thickening is seen involving the sigmoid colon with pericolonic inflammatory changes. No definite diverticular changes are seen, and this is consistent with nonspecific colitis. No evidence of abscess or free fluid. Vascular/Lymphatic: No pathologically enlarged lymph nodes. No acute vascular findings. Reproductive:  No mass or other significant abnormality. Other:  None. Musculoskeletal:  No suspicious bone lesions identified. IMPRESSION: Moderate nonspecific sigmoid colitis. No evidence of abscess or other complication. Electronically Signed   By: Danae Orleans M.D.   On: 10/25/2023 15:45    Microbiology: Results for orders placed or performed during the hospital encounter of 10/25/23  Gastrointestinal Panel by PCR , Stool     Status: None   Collection Time: 10/25/23  4:59 PM   Specimen: Stool  Result Value Ref Range Status   Campylobacter species NOT DETECTED NOT DETECTED Final   Plesimonas shigelloides NOT DETECTED NOT DETECTED Final   Salmonella species NOT DETECTED NOT DETECTED Final   Yersinia enterocolitica NOT DETECTED NOT DETECTED Final   Vibrio species NOT DETECTED NOT DETECTED Final   Vibrio cholerae NOT DETECTED NOT DETECTED Final   Enteroaggregative E coli (EAEC) NOT DETECTED NOT  DETECTED Final   Enteropathogenic E coli (EPEC) NOT DETECTED NOT DETECTED Final   Enterotoxigenic E coli (ETEC) NOT DETECTED NOT DETECTED Final   Shiga like toxin producing E coli (STEC) NOT DETECTED NOT DETECTED Final   Shigella/Enteroinvasive E coli (EIEC) NOT DETECTED NOT DETECTED Final   Cryptosporidium NOT DETECTED NOT DETECTED Final   Cyclospora cayetanensis NOT DETECTED NOT DETECTED Final   Entamoeba histolytica NOT DETECTED NOT DETECTED Final   Giardia lamblia NOT DETECTED NOT DETECTED Final   Adenovirus F40/41 NOT DETECTED NOT DETECTED Final   Astrovirus NOT  DETECTED NOT DETECTED Final   Norovirus GI/GII NOT DETECTED NOT DETECTED Final   Rotavirus A NOT DETECTED NOT DETECTED Final   Sapovirus (I, II, IV, and V) NOT DETECTED NOT DETECTED Final    Comment: Performed at [facility], [address] [place], [place] [address]    Labs: CBC: Recent Labs  Lab 10/25/23 1255 10/25/23 2038 10/26/23 0617 10/27/23 0537 10/28/23 0637  WBC 15.3* 13.5* 11.8* 10.3 9.1  NEUTROABS  --   --   --   --  5.1  HGB 17.8* 16.1 15.9 15.3 15.3  HCT 52.1* 46.8 46.1 44.3 44.8  MCV 85.1 85.1 84.9 84.5 84.4  PLT 326 281 232 243 243   Basic Metabolic Panel: Recent Labs  Lab 10/25/23 1255 10/26/23 0617 10/27/23 0537 10/28/23 0637  NA 137 138 139 142  K 3.6 3.5 3.5 3.8  CL 102 105 103 106  CO2 23 26 26 25   GLUCOSE 114* 104* 100* 104*  BUN 11 10 10 13   CREATININE 1.10 1.05 0.98 1.13  CALCIUM 9.2 8.7* 8.8* 8.7*   Liver Function Tests: Recent Labs  Lab 10/25/23 1255 10/26/23 0617 10/28/23 0637  AST 18 14* 21  ALT 33 26 33  ALKPHOS 59 48 44  BILITOT 1.0 0.7 0.5  PROT 7.0 6.0* 6.2*  ALBUMIN 3.9 3.4* 3.4*   CBG: No results for input(s): "GLUCAP" in the last 168 hours.  Discharge time spent: greater than 30 minutes.  Signed: Dollie Bressi, DO [facility] 10/28/2023

## 2023-10-29 ENCOUNTER — Telehealth: Payer: Self-pay

## 2023-10-29 NOTE — Transitions of Care (Post Inpatient/ED Visit) (Unsigned)
   10/29/2023  Name: Robert Mcpherson MRN: [id number] DOB: [birth year]  Today's TOC FU Call Status: Today's TOC FU Call Status:: Unsuccessful Call (1st Attempt) Unsuccessful Call (1st Attempt) Date: 10/29/23  Attempted to reach the patient regarding the most recent Inpatient/ED visit.  Follow Up Plan: Additional outreach attempts will be made to reach the patient to complete the Transitions of Care (Post Inpatient/ED visit) call.    [name], CMA  [facility] Team Direct Dial: [phone]

## 2023-11-01 ENCOUNTER — Telehealth: Payer: Self-pay

## 2023-11-01 NOTE — Telephone Encounter (Signed)
We have scheduled you to see Quentin Mulling PA 11/30/23 at 1:30pm. Appt letter and Earleen Reaper message sent.

## 2023-11-01 NOTE — Telephone Encounter (Signed)
-----   Message from Jenel Lucks sent at 10/27/2023  2:37 PM EST ----- Regarding: Hospital follow-up Bonita Quin, Can you please schedule an outpatient follow-up visit with myself or app (preferably Marchelle Folks) in the next month to follow-up on colitis and to schedule a colonoscopy?  Thanks,  [place]

## 2023-11-02 ENCOUNTER — Telehealth: Payer: Self-pay | Admitting: Family Medicine

## 2023-11-02 NOTE — Telephone Encounter (Signed)
 Prescription Request  11/02/2023  LOV: 08/02/2023  What is the name of the medication or equipment? Zepbound  inject 2.5 mg under the skin once a week  Have you contacted your pharmacy to request a refill? Yes   Which pharmacy would you like this sent to?  [facility] - [place] - [place] - [address] [place] [address] Phone: [phone] Fax: [phone]    Patient notified that their request is being sent to the clinical staff for review and that they should receive a response within 2 business days.   Please advise at [facility] [phone]

## 2023-11-02 NOTE — Telephone Encounter (Signed)
Zepbound is not on medication list.

## 2023-11-02 NOTE — Transitions of Care (Post Inpatient/ED Visit) (Signed)
   11/02/2023  Name: Robert Mcpherson MRN: [id number] DOB: Feb 03, 1976  Today's TOC FU Call Status: Today's TOC FU Call Status:: Unsuccessful Call (2nd Attempt) Unsuccessful Call (1st Attempt) Date: 10/29/23 Unsuccessful Call (2nd Attempt) Date: 11/02/23  Attempted to reach the patient regarding the most recent Inpatient/ED visit.  Follow Up Plan: Additional outreach attempts will be made to reach the patient to complete the Transitions of Care (Post Inpatient/ED visit) call.   Tremont Gavitt, CMA  [facility] Team Direct Dial: [phone]

## 2023-11-04 NOTE — Transitions of Care (Post Inpatient/ED Visit) (Signed)
   11/04/2023  Name: Robert Mcpherson MRN: [id number] DOB: Oct 14, 1976  Today's TOC FU Call Status: Today's TOC FU Call Status:: Unsuccessful Call (3rd Attempt) Unsuccessful Call (1st Attempt) Date: 10/29/23 Unsuccessful Call (2nd Attempt) Date: 11/02/23 Unsuccessful Call (3rd Attempt) Date: 11/04/23  Attempted to reach the patient regarding the most recent Inpatient/ED visit.  Follow Up Plan: No further outreach attempts will be made at this time. We have been unable to contact the patient.  Ahlijah Raia, CMA  [facility] Team Direct Dial: [phone]

## 2023-11-09 ENCOUNTER — Other Ambulatory Visit: Payer: Self-pay | Admitting: Family Medicine

## 2023-11-11 DIAGNOSIS — J351 Hypertrophy of tonsils: Secondary | ICD-10-CM | POA: Diagnosis not present

## 2023-11-29 DIAGNOSIS — F422 Mixed obsessional thoughts and acts: Secondary | ICD-10-CM | POA: Diagnosis not present

## 2023-11-29 DIAGNOSIS — F410 Panic disorder [episodic paroxysmal anxiety] without agoraphobia: Secondary | ICD-10-CM | POA: Diagnosis not present

## 2023-11-29 DIAGNOSIS — F4010 Social phobia, unspecified: Secondary | ICD-10-CM | POA: Diagnosis not present

## 2023-11-29 DIAGNOSIS — F4312 Post-traumatic stress disorder, chronic: Secondary | ICD-10-CM | POA: Diagnosis not present

## 2023-11-29 DIAGNOSIS — F341 Dysthymic disorder: Secondary | ICD-10-CM | POA: Diagnosis not present

## 2023-11-30 ENCOUNTER — Ambulatory Visit: Payer: Medicaid Other | Admitting: Physician Assistant

## 2023-11-30 NOTE — Progress Notes (Deleted)
11/30/2023 Robert Mcpherson [id number] [birth year]  Referring provider: Donita Brooks, MD Primary GI doctor: {acdocs:27040}  ASSESSMENT AND PLAN:   Assessment and Plan              Patient Care Team: Donita Brooks, MD as PCP - General (Family Medicine) Linna Darner, RD as Dietitian (Family Medicine) West Bali, MD (Inactive) as Consulting Physician (Gastroenterology)  HISTORY OF PRESENT ILLNESS: 48 y.o. male with a past medical history of anxiety, OCD, OSA with CPAP, hyperlipidemia, depression and others listed below presents for evaluation of colonoscopy.   10/25/2023 for abdominal discomfort had CT that showed moderate nonspecific sigmoid colitis no abscesses or complications treated with Cipro Flagyl presents here for follow-up. Patient had leukocytosis at 15.3, recheck 11.8. Patient's kidney function was normal, had low protein at 3.4, AST low at 14.  After chart review a year to 2 years ago patient did have isolated ALT elevation.  04/25/2021 AST 45, ALT of 107, then ALT 1967  Discussed the use of AI scribe software for clinical note transcription with the patient, who gave verbal consent to proceed.  History of Present Illness             He  reports that he has never smoked. He has never used smokeless tobacco. He reports that he does not currently use alcohol. He reports that he does not currently use drugs after having used the following drugs: Marijuana.  RELEVANT GI HISTORY, LABS, IMAGING: 10/25/2023 CT abdomen pelvis with contrast IMPRESSION: Moderate nonspecific sigmoid colitis. No evidence of abscess or other complication. CBC    Component Value Date/Time   WBC 9.1 10/28/2023 0637   RBC 5.31 10/28/2023 0637   HGB 15.3 10/28/2023 0637   HCT 44.8 10/28/2023 0637   PLT 243 10/28/2023 0637   MCV 84.4 10/28/2023 0637   MCV 92.3 10/13/2014 1426   MCH 28.8 10/28/2023 0637   MCHC 34.2 10/28/2023 0637   RDW 12.6 10/28/2023 0637   LYMPHSABS  3.0 10/28/2023 0637   MONOABS 0.6 10/28/2023 0637   EOSABS 0.3 10/28/2023 0637   BASOSABS 0.1 10/28/2023 0637   Recent Labs    03/03/23 1155 08/02/23 1051 10/14/23 1133 10/25/23 1255 10/25/23 2038 10/26/23 0617 10/27/23 0537 10/28/23 0637  HGB 17.4* 17.0 17.2* 17.8* 16.1 15.9 15.3 15.3    CMP     Component Value Date/Time   NA 142 10/28/2023 0637   K 3.8 10/28/2023 0637   CL 106 10/28/2023 0637   CO2 25 10/28/2023 0637   GLUCOSE 104 (H) 10/28/2023 0637   BUN 13 10/28/2023 0637   CREATININE 1.13 10/28/2023 0637   CREATININE 1.46 (H) 08/02/2023 1051   CALCIUM 8.7 (L) 10/28/2023 0637   PROT 6.2 (L) 10/28/2023 0637   ALBUMIN 3.4 (L) 10/28/2023 0637   AST 21 10/28/2023 0637   ALT 33 10/28/2023 0637   ALKPHOS 44 10/28/2023 0637   BILITOT 0.5 10/28/2023 0637   GFRNONAA >60 10/28/2023 0637   GFRNONAA 87 08/25/2016 0950   GFRAA >60 11/29/2017 1156   GFRAA >89 08/25/2016 0950      Latest Ref Rng & Units 10/28/2023    6:37 AM 10/26/2023    6:17 AM 10/25/2023   12:55 PM  Hepatic Function  Total Protein 6.5 - 8.1 g/dL 6.2  6.0  7.0   Albumin 3.5 - 5.0 g/dL 3.4  3.4  3.9   AST 15 - 41 U/L 21  14  18    ALT  0 - 44 U/L 33  26  33   Alk Phosphatase 38 - 126 U/L 44  48  59   Total Bilirubin <1.2 mg/dL 0.5  0.7  1.0       Current Medications:   Current Outpatient Medications (Endocrine & Metabolic):    Testosterone 20.25 MG/ACT (1.62%) GEL, Place 2 Pump onto the skin daily.  Current Outpatient Medications (Cardiovascular):    EPINEPHrine 0.3 mg/0.3 mL IJ SOAJ injection, Inject 0.3 mg into the muscle as needed for anaphylaxis.  Current Outpatient Medications (Respiratory):    diphenhydrAMINE (BENADRYL) 25 MG tablet, Take 25 mg by mouth at bedtime.    Current Outpatient Medications (Other):    clonazePAM (KLONOPIN) 1 MG tablet, TAKE 1 TABLET BY MOUTH THREE TIMES DAILY AS NEEDED FOR ANXIETY   sertraline (ZOLOFT) 50 MG tablet, Take 2 tablets (100 mg total) by mouth  daily. 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 Family History:  His family history includes Heart disease (age of onset: 3) in his maternal uncle; Heart disease (age of onset: 64) in his maternal uncle.  REVIEW OF SYSTEMS  : All other systems reviewed and negative except where noted in the History of Present Illness.  PHYSICAL EXAM: There were no vitals taken for this visit. General Appearance: Well nourished, in no apparent distress. Head:   Normocephalic and atraumatic. Eyes:  sclerae anicteric,conjunctive pink  Respiratory: Respiratory effort normal, BS equal bilaterally without rales, rhonchi, wheezing. Cardio: RRR with no MRGs. Peripheral pulses intact.  Abdomen: Soft,  {BlankSingle:19197::"Flat","Obese","Non-distended"} ,active bowel sounds. {actendernessAB:27319} tenderness {anatomy; site abdomen:5010}. {BlankMultiple:19196::"Without guarding","With guarding","Without rebound","With rebound"}. No masses. Rectal: {acrectalexam:27461} Musculoskeletal: Full ROM, {PSY - GAIT AND STATION:22860} gait. {With/Without:304960234} edema. Skin:  Dry and intact without  significant lesions or rashes Neuro: Alert and  oriented x4;  No focal deficits. Psych:  Cooperative. Normal mood and affect.    Doree Albee, PA-C 8:26 AM

## 2023-12-10 ENCOUNTER — Telehealth: Payer: Self-pay

## 2023-12-10 NOTE — Telephone Encounter (Signed)
 Robert Mcpherson (Key: [id number])  This request has received a Favorable outcome.  Please note any additional information provided by [employer]   [employer] at the bottom of this request.

## 2023-12-15 DIAGNOSIS — F422 Mixed obsessional thoughts and acts: Secondary | ICD-10-CM | POA: Diagnosis not present

## 2023-12-15 DIAGNOSIS — F4312 Post-traumatic stress disorder, chronic: Secondary | ICD-10-CM | POA: Diagnosis not present

## 2023-12-15 DIAGNOSIS — F341 Dysthymic disorder: Secondary | ICD-10-CM | POA: Diagnosis not present

## 2023-12-15 DIAGNOSIS — F410 Panic disorder [episodic paroxysmal anxiety] without agoraphobia: Secondary | ICD-10-CM | POA: Diagnosis not present

## 2023-12-15 DIAGNOSIS — F4010 Social phobia, unspecified: Secondary | ICD-10-CM | POA: Diagnosis not present

## 2023-12-23 ENCOUNTER — Other Ambulatory Visit (HOSPITAL_COMMUNITY): Payer: Self-pay

## 2023-12-27 ENCOUNTER — Other Ambulatory Visit: Payer: Self-pay | Admitting: Family Medicine

## 2023-12-28 ENCOUNTER — Other Ambulatory Visit: Payer: Self-pay | Admitting: Family Medicine

## 2023-12-28 MED ORDER — CLONAZEPAM 1 MG PO TABS: 1.0000 mg | ORAL_TABLET | Freq: Three times a day (TID) | ORAL | 0 refills | Status: DC | PRN

## 2024-02-06 ENCOUNTER — Other Ambulatory Visit: Payer: Self-pay | Admitting: Family Medicine

## 2024-02-08 NOTE — Telephone Encounter (Signed)
 Requested medication (s) are due for refill today: yes  Requested medication (s) are on the active medication list: yes  Last refill:  12/28/23 #90   Future visit scheduled: no  Notes to clinic:  med not delegated to NT to RF   Requested Prescriptions  Pending Prescriptions Disp Refills   clonazePAM (KLONOPIN) 1 MG tablet [Pharmacy Med Name: clonazePAM 1 MG Oral Tablet] 90 tablet 0    Sig: TAKE 1 TABLET BY MOUTH THREE TIMES DAILY AS NEEDED FOR ANXIETY     Not Delegated - Psychiatry: Anxiolytics/Hypnotics 2 Failed - 02/08/2024 10:22 AM      Failed - This refill cannot be delegated      Failed - Urine Drug Screen completed in last 360 days      Failed - Valid encounter within last 6 months    Recent Outpatient Visits           6 months ago Hypogonadism in male   [facility] Pickard, Priscille Heidelberg, MD   1 year ago Current severe episode of major depressive disorder without psychotic features, unspecified whether recurrent [facility])   [place] [facility] Pickard, Priscille Heidelberg, MD   1 year ago MDD (major depressive disorder), recurrent episode, moderate (HCC)   [place] [facility] Pickard, Priscille Heidelberg, MD   1 year ago Fatigue, unspecified type   [place] [facility] Pickard, Priscille Heidelberg, MD   1 year ago MDD (major depressive disorder), recurrent episode, moderate (HCC)   [place] [facility] Pickard, Priscille Heidelberg, MD              Passed - Patient is not pregnant

## 2024-02-23 ENCOUNTER — Ambulatory Visit: Payer: Self-pay

## 2024-02-23 DIAGNOSIS — Z6839 Body mass index (BMI) 39.0-39.9, adult: Secondary | ICD-10-CM | POA: Diagnosis not present

## 2024-02-23 DIAGNOSIS — G4733 Obstructive sleep apnea (adult) (pediatric): Secondary | ICD-10-CM | POA: Diagnosis not present

## 2024-02-23 NOTE — Telephone Encounter (Signed)
 Copied from CRM [id number]. Topic: Clinical - Red Word Triage >> Feb 23, 2024  3:40 PM Oddis Bench wrote: Red Word that prompted transfer to Nurse Triage: Patient is calling about Blood pressure being 180/126.   Chief Complaint: Blood Pressure-High  Symptoms: Dizziness,  Frequency: Acute  Pertinent Negatives: Patient denies chest pain, headaches   Disposition: [x] ED /[] Urgent Care (no appt availability in office) / [] Appointment(In office/virtual)/ []  [place] [facility]/ [] Home Care/ [] Refused Recommended Disposition /[] [facility]/ []  Follow-up with PCP Additional Notes: HF is being triaged for an elevated blood pressure reading at the ENT 182/126.The patient reports some dyspnea and dizziness, recommended ED for prompt evaluation and treatment and safety. Patient verbalized understanding and agreed to disposition.   Reason for Disposition  [1] Systolic BP  >= 160 OR Diastolic >= 100 AND [2] cardiac (e.g., breathing difficulty, chest pain) or neurologic symptoms (e.g., new-onset blurred or double vision, unsteady gait)  Answer Assessment - Initial Assessment Questions 1. BLOOD PRESSURE: "What is the blood pressure?" "Did you take at least two measurements 5 minutes apart?"     182/126 at the ENT  2. ONSET: "When did you take your blood pressure?"     Today  3. HOW: "How did you take your blood pressure?" (e.g., automatic home BP monitor, visiting nurse)     At the ENT's office  4. HISTORY: "Do you have a history of high blood pressure?"     Yes, untreated  5. MEDICINES: "Are you taking any medicines for blood pressure?" "Have you missed any doses recently?"     No  6. OTHER SYMPTOMS: "Do you have any symptoms?" (e.g., blurred vision, chest pain, difficulty breathing, headache, weakness)     Dizziness, Dyspnea  Protocols used: Blood Pressure - High-A-AH

## 2024-02-29 ENCOUNTER — Ambulatory Visit: Admitting: Family Medicine

## 2024-03-11 ENCOUNTER — Other Ambulatory Visit: Payer: Self-pay | Admitting: Family Medicine

## 2024-03-13 ENCOUNTER — Ambulatory Visit: Admitting: Family Medicine

## 2024-03-14 NOTE — Telephone Encounter (Signed)
 Requested medications are due for refill today.  yes  Requested medications are on the active medications list.  yes  Last refill. 02/08/2024 #90 1YN  Future visit scheduled.   no  Notes to clinic.  Refill not delegated.    Requested Prescriptions  Pending Prescriptions Disp Refills   clonazePAM  (KLONOPIN ) 1 MG tablet [Pharmacy Med Name: clonazePAM  1 MG Oral Tablet] 90 tablet 0    Sig: TAKE 1 TABLET BY MOUTH THREE TIMES DAILY AS NEEDED FOR ANXIETY     Not Delegated - Psychiatry: Anxiolytics/Hypnotics 2 Failed - 03/14/2024 11:12 AM      Failed - This refill cannot be delegated      Failed - Urine Drug Screen completed in last 360 days      Failed - Valid encounter within last 6 months    Recent Outpatient Visits           7 months ago Hypogonadism in male   [facility] Pickard, Cisco Crest, MD   1 year ago Current severe episode of major depressive disorder without psychotic features, unspecified whether recurrent [facility])   [place] [facility] Pickard, Cisco Crest, MD   1 year ago MDD (major depressive disorder), recurrent episode, moderate (HCC)   [place] [facility] Pickard, Cisco Crest, MD   1 year ago Fatigue, unspecified type   [place] [facility] Pickard, Cisco Crest, MD   1 year ago MDD (major depressive disorder), recurrent episode, moderate (HCC)    [facility] Pickard, Cisco Crest, MD              Passed - Patient is not pregnant

## 2024-04-26 ENCOUNTER — Other Ambulatory Visit: Payer: Self-pay | Admitting: Family Medicine

## 2024-04-26 NOTE — Telephone Encounter (Signed)
 Copied from CRM [id number]. Topic: Clinical - Medication Refill >> Apr 26, 2024  2:15 PM Keana M wrote: Medication: clonazePAM  (KLONOPIN ) 1 MG tablet  Has the patient contacted their pharmacy? Yes (Agent: If no, request that the patient contact the pharmacy for the refill. If patient does not wish to contact the pharmacy document the reason why and proceed with request.) (Agent: If yes, when and what did the pharmacy advise?)  This is the patient's preferred pharmacy:  [facility] [address], [place] - [address] [place] [address] Phone: [phone] Fax: [phone]  Is this the correct pharmacy for this prescription? Yes If no, delete pharmacy and type the correct one.   Has the prescription been filled recently? No  Is the patient out of the medication? Yes  Has the patient been seen for an appointment in the last year OR does the patient have an upcoming appointment? Yes  Can we respond through MyChart? Yes  Agent: Please be advised that Rx refills may take up to 3 business days. We ask that you follow-up with your pharmacy.

## 2024-04-27 ENCOUNTER — Encounter: Payer: Self-pay | Admitting: Family Medicine

## 2024-04-27 NOTE — Telephone Encounter (Signed)
 Requested medications are due for refill today.  yes  Requested medications are on the active medications list.  yes  Last refill. 03/14/2024 #90 0 rf  Future visit scheduled.   no  Notes to clinic.  Refill not delegated.    Requested Prescriptions  Pending Prescriptions Disp Refills   clonazePAM  (KLONOPIN ) 1 MG tablet 90 tablet 0    Sig: Take 1 tablet (1 mg total) by mouth 3 (three) times daily as needed. for anxiety     Not Delegated - Psychiatry: Anxiolytics/Hypnotics 2 Failed - 04/27/2024  4:26 PM      Failed - This refill cannot be delegated      Failed - Urine Drug Screen completed in last 360 days      Failed - Valid encounter within last 6 months    Recent Outpatient Visits           8 months ago Hypogonadism in male   [place] [facility] Pickard, Butler DASEN, MD   1 year ago Current severe episode of major depressive disorder without psychotic features, unspecified whether recurrent [facility])   [place] [facility] Pickard, Butler DASEN, MD   1 year ago MDD (major depressive disorder), recurrent episode, moderate (HCC)   [facility] Pickard, Butler DASEN, MD   1 year ago Fatigue, unspecified type   [place] [facility] Pickard, Butler DASEN, MD   1 year ago MDD (major depressive disorder), recurrent episode, moderate (HCC)   [facility] Pickard, Butler DASEN, MD              Passed - Patient is not pregnant

## 2024-05-03 ENCOUNTER — Ambulatory Visit: Payer: Self-pay

## 2024-05-03 NOTE — Telephone Encounter (Signed)
 FYI Only or Action Required?: FYI only for provider.  Patient was last seen in primary care on 08/02/2023 by Duanne Butler DASEN, MD. Called Nurse Triage reporting Hypertension. Symptoms began yesterday. Interventions attempted: Rest, hydration, or home remedies. Symptoms are: unchanged.  Triage Disposition: See PCP When Office is Open (Within 3 Days)  Patient/caregiver understands and will follow disposition?: Yes Pt states he feels like BP elevated, doesn't have a way to check it but wife states she will go by a BP monitor. Pt feels may be dehydrated too. Offered OV tomorrow but pt doesn't want to come in he rather do VV, appt scheduled and advised to increase fluids and get BP for tomorrows appt. Pt verbalized understanding.   Copied from CRM [id number]. Topic: Clinical - Red Word Triage >> May 03, 2024 12:52 PM Shardie S wrote: [facility] that prompted transfer to Nurse Triage: concerned about BP being elevated, Headaches Reason for Disposition  Systolic BP  >= 160 OR Diastolic >= 100  Answer Assessment - Initial Assessment Questions 1. BLOOD PRESSURE: What is the blood pressure? Did you take at least two measurements 5 minutes apart?     Unable to check  2. ONSET: When did you take your blood pressure?     Yesterday  4. HISTORY: Do you have a history of high blood pressure?     Elevated at times, but never dx  5. MEDICINES: Are you taking any medicines for blood pressure? Have you missed any doses recently?     Not on any medications 6. OTHER SYMPTOMS: Do you have any symptoms? (e.g., blurred vision, chest pain, difficulty breathing, headache, weakness)     HA, feels hot  Protocols used: Blood Pressure - High-A-AH

## 2024-05-04 ENCOUNTER — Telehealth: Admitting: Family Medicine

## 2024-05-04 ENCOUNTER — Encounter: Payer: Self-pay | Admitting: Family Medicine

## 2024-05-04 DIAGNOSIS — I10 Essential (primary) hypertension: Secondary | ICD-10-CM

## 2024-05-04 MED ORDER — LISINOPRIL 10 MG PO TABS: 10.0000 mg | ORAL_TABLET | Freq: Every day | ORAL | 3 refills | Status: AC

## 2024-05-04 NOTE — Progress Notes (Signed)
 Virtual Visit via Video note  I connected with Robert Mcpherson on 05/04/24 at 1506 by video and verified that I am speaking with the correct person using two identifiers. Robert Mcpherson is currently located at home and no one is currently with him during visit. The provider, Jeoffrey GORMAN Barrio, FNP is located in their office at time of visit.  I discussed the limitations, risks, security and privacy concerns of performing an evaluation and management service by video and the availability of in person appointments. I also discussed with the patient that there may be a patient responsible charge related to this service. The patient expressed understanding and agreed to proceed.  Subjective: PCP: Duanne Butler DASEN, MD  No chief complaint on file.   HPI Pt being seen today for elevated BP readings at home. Home readings have been 154/112, 158/100. HR has been in 80s. Does have history of elevated BP and has considered with his PCP starting medication but has never been treated for his elevated BP in the past. Denies chest pain, palpitations, vision changes, lightheadedness, dizziness, dyspnea on exertion, or swelling of extremities. He did have headaches yesterday. Has been going through increased stress at home in his relationship. Has stopped taking Zoloft  due to side effects, is using Klonopin  PRN. Discussed importance of managing stress, anxiety, depression, and lifestyle interventions.   ROS: Per HPI  Current Outpatient Medications:    lisinopril (ZESTRIL) 10 MG tablet, Take 1 tablet (10 mg total) by mouth daily., Disp: 90 tablet, Rfl: 3   clonazePAM  (KLONOPIN ) 1 MG tablet, TAKE 1 TABLET BY MOUTH THREE TIMES DAILY AS NEEDED FOR ANXIETY, Disp: 90 tablet, Rfl: 0   diphenhydrAMINE  (BENADRYL ) 25 MG tablet, Take 25 mg by mouth at bedtime., Disp: , Rfl:    EPINEPHrine  0.3 mg/0.3 mL IJ SOAJ injection, Inject 0.3 mg into the muscle as needed for anaphylaxis., Disp: 1 each, Rfl: 1   sertraline  (ZOLOFT )  50 MG tablet, Take 2 tablets (100 mg total) by mouth daily. 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  Pulmonary:     Effort: No respiratory distress.  Neurological:     General: No focal deficit present.     Mental Status: He is alert and oriented to person, place, and time.  Psychiatric:        Mood and Affect: Mood normal.        Behavior: Behavior normal.  Thought Content: Thought content normal.        Judgment: Judgment normal.    Assessment and Plan: Hypertension, unspecified type Assessment & Plan: BP elevated at home readings. Does endorse high anxiety however feels his BP has been elevated in past despite control of anxiety. Will start lisinopril 10mg  daily. Recommend heart healthy diet such as Mediterranean diet with whole grains, fruits, vegetable, fish, lean meats, nuts, and olive oil. Limit salt. Encouraged moderate walking, 3-5 times/week for 30-50 minutes each session. Aim for at least 150 minutes.week. Goal should be pace of 3 miles/hours, or walking 1.5 miles in 30 minutes. Avoid tobacco products. Avoid excess alcohol. Take medications as  prescribed and bring medications and blood pressure log with cuff to each office visit. Seek medical care for chest pain, palpitations, shortness of breath with exertion, dizziness/lightheadedness, vision changes, recurrent headaches, or swelling of extremities. Follow up in 2-4 weeks with PCP.    Other orders -     Lisinopril; Take 1 tablet (10 mg total) by mouth daily.  Dispense: 90 tablet; Refill: 3    Follow Up Instructions: No follow-ups on file.   I discussed the assessment and treatment plan with the patient. The patient was provided an opportunity to ask questions and all were answered. The patient agreed with the plan and demonstrated an understanding of the instructions.   The patient was advised to call back or seek an in-person evaluation if the symptoms worsen or if the condition fails to improve as anticipated.  The above assessment and management plan was discussed with the patient. The patient verbalized understanding of and has agreed to the management plan. Patient is aware to call the clinic if symptoms persist or worsen. Patient is aware when to return to the clinic for a follow-up visit. Patient educated on when it is appropriate to go to the emergency department.   Time call ended: 1517  I provided 11 minutes of face-to-face time during this encounter.   Jeoffrey Barrio, MSN, APRN, FNP-C [facility]

## 2024-05-04 NOTE — Assessment & Plan Note (Signed)
 BP elevated at home readings. Does endorse high anxiety however feels his BP has been elevated in past despite control of anxiety. Will start lisinopril 10mg  daily. Recommend heart healthy diet such as Mediterranean diet with whole grains, fruits, vegetable, fish, lean meats, nuts, and olive oil. Limit salt. Encouraged moderate walking, 3-5 times/week for 30-50 minutes each session. Aim for at least 150 minutes.week. Goal should be pace of 3 miles/hours, or walking 1.5 miles in 30 minutes. Avoid tobacco products. Avoid excess alcohol. Take medications as prescribed and bring medications and blood pressure log with cuff to each office visit. Seek medical care for chest pain, palpitations, shortness of breath with exertion, dizziness/lightheadedness, vision changes, recurrent headaches, or swelling of extremities. Follow up in 2-4 weeks with PCP.

## 2024-05-04 NOTE — Telephone Encounter (Signed)
 Called pt and LVMTCB to see if pt can change appt to in person per Jeoffrey, NP request.

## 2024-05-23 ENCOUNTER — Encounter: Payer: Self-pay | Admitting: Family Medicine

## 2024-05-24 ENCOUNTER — Other Ambulatory Visit: Payer: Self-pay | Admitting: Family Medicine

## 2024-05-24 MED ORDER — SERTRALINE HCL 50 MG PO TABS: 50.0000 mg | ORAL_TABLET | Freq: Every day | ORAL | 0 refills | Status: DC

## 2024-05-25 ENCOUNTER — Ambulatory Visit: Admitting: Family Medicine

## 2024-05-30 ENCOUNTER — Other Ambulatory Visit: Payer: Self-pay | Admitting: Family Medicine

## 2024-06-17 ENCOUNTER — Other Ambulatory Visit: Payer: Self-pay | Admitting: Family Medicine

## 2024-06-19 ENCOUNTER — Telehealth: Admitting: Family Medicine

## 2024-06-19 DIAGNOSIS — F411 Generalized anxiety disorder: Secondary | ICD-10-CM | POA: Diagnosis not present

## 2024-06-19 MED ORDER — BUSPIRONE HCL 10 MG PO TABS: 10.0000 mg | ORAL_TABLET | Freq: Two times a day (BID) | ORAL | 3 refills | Status: DC

## 2024-06-19 MED ORDER — SERTRALINE HCL 100 MG PO TABS
100.0000 mg | ORAL_TABLET | Freq: Every day | ORAL | 3 refills | Status: AC
Start: 2024-06-19 — End: ?

## 2024-06-19 NOTE — Progress Notes (Signed)
 Subjective:    Patient ID: Robert Mcpherson, male    DOB: [birth year], 48 y.o.   MRN: [id number]  HPI  Patient presents today for a video visit.  He is currently at home.  I am currently in my office.  He consents to be seen via video.  Video call began at 4:03.  Call concluded at 420.  Patient has a history of generalized anxiety disorder.  He has been on Zoloft  for many years.  1 month ago he resumed Zoloft  50 mg p.o. nightly.  He had stopped Zoloft  due to worsening anxiety and depression in February.  However at that time, his wife was having a nervous breakdown.  He believes that this was likely the problem.  Over the next several months, he has noticed worsening anxiety.  He is now afraid to leave the home.  He is no longer working.  He is making money simply by selling all of his assets he is afraid to go into public.  He is afraid to be around people.  He is afraid to even drive his kids to school.  He reports always being afraid of something bad happening.  He has agoraphobia but also panic disorder.  Zoloft  50 mg is not helping.  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   Nasal turbinate hypertrophy 09/2013   OCD (obsessive compulsive disorder) 04/02/2014   Sensation of fullness in right ear 09/13/2018   Sleep apnea    Snoring 09/13/2018   Past Surgical History:  Procedure Laterality Date   APPENDECTOMY     LUMBAR LAMINECTOMY/DECOMPRESSION MICRODISCECTOMY Left 12/02/2017   Procedure: Left Lumbar 4-5 disectomy;  Surgeon: Burnetta Aures, MD;  Location: [facility] OR;  Service: Orthopedics;  Laterality: Left;  2 hrs   NASAL SEPTOPLASTY W/ TURBINOPLASTY Bilateral 10/02/2013   Procedure: BILATERAL NASAL SEPTOPLASTY WITH TURBINATE REDUCTION;  Surgeon: Ida Loader,  MD;  Location: [facility];  Service: ENT;  Laterality: Bilateral;   SHOULDER ARTHROSCOPY Right 11/2013   TONSILLECTOMY Bilateral 10/14/2023   Procedure: TONSILLECTOMY;  Surgeon: Carlie Clark, MD;  [facility]: [facility];  Service: ENT;  Laterality: Bilateral;   VASECTOMY  06/02/2013   Current Outpatient Medications on File Prior to Visit  Medication Sig Dispense Refill   clonazePAM  (KLONOPIN ) 1 MG tablet TAKE 1 TABLET BY MOUTH THREE TIMES DAILY AS NEEDED FOR ANXIETY 90 tablet 0   diphenhydrAMINE  (BENADRYL ) 25 MG tablet Take 25 mg by mouth at bedtime.     EPINEPHrine  0.3 mg/0.3 mL IJ SOAJ injection Inject 0.3 mg into the muscle as needed for anaphylaxis. 1 each 1   lisinopril  (ZESTRIL ) 10 MG tablet Take 1 tablet (10 mg total) by mouth daily. 90 tablet 3   sertraline  (ZOLOFT ) 50 MG tablet Take 1 tablet (50 mg total) by mouth daily. 30 tablet 0   Testosterone  20.25 MG/ACT (1.62%) GEL Place 2 Pump onto the skin daily. 75 g 5   No current facility-administered medications on file prior to visit.   Allergies  Allergen Reactions   Cephalexin Hives   Social History   Socioeconomic History   Marital status: Married    Spouse name: Not on file   Number  of children: Not on file   Years of education: Not on file   Highest education level: Some college, no degree  Occupational History   Not on file  Tobacco Use   Smoking status: Never   Smokeless tobacco: Never  Vaping Use   Vaping status: Never Used  Substance and Sexual Activity   Alcohol use: Not Currently    Comment: history of alcohol use in his 87s, none now    Drug use: Not Currently    Types: Marijuana   Sexual activity: Yes  Other Topics Concern   Not on file  Social History Narrative   Not on file   Social Drivers of Health   Financial Resource Strain: Medium Risk (05/03/2024)   Overall Financial Resource Strain (CARDIA)    Difficulty of Paying Living Expenses: Somewhat hard  Food Insecurity: No Food Insecurity  (05/03/2024)   Hunger Vital Sign    Worried About Running Out of Food in the Last Year: Never true    Ran Out of Food in the Last Year: Never true  Transportation Needs: No Transportation Needs (05/03/2024)   PRAPARE - Administrator, Civil Service (Medical): No    Lack of Transportation (Non-Medical): No  Physical Activity: Inactive (05/03/2024)   Exercise Vital Sign    Days of Exercise per Week: 0 days    Minutes of Exercise per Session: Not on file  Stress: Stress Concern Present (05/03/2024)   [employer] of Occupational Health - Occupational Stress Questionnaire    Feeling of Stress: Very much  Social Connections: Moderately Integrated (05/03/2024)   Social Connection and Isolation Panel    Frequency of Communication with Friends and Family: More than three times a week    Frequency of Social Gatherings with Friends and Family: Never    Attends Religious Services: 1 to 4 times per year    Active Member of [employer] or Organizations: No    Attends Banker Meetings: Not on file    Marital Status: Married  Catering manager Violence: Not At Risk (10/25/2023)   Humiliation, Afraid, Rape, and Kick questionnaire    Fear of Current or Ex-Partner: No    Emotionally Abused: No    Physically Abused: No    Sexually Abused: No     Review of Systems  All other systems reviewed and are negative.      Objective:   Physical Exam        Assessment & Plan:   GAD (generalized anxiety disorder) Patient denies suicidal thoughts.  He is said that he denies depression.  He is more sad about how this is affecting his wife and his children.  We have decided to increase Zoloft  to 100 mg daily and supplement with BuSpar  10 mg twice daily.  Reassess in 1 month or sooner if worsening.  We discussed possibly trying Vraylar versus trying gabapentin for anxiety but have elected to use BuSpar 

## 2024-06-20 NOTE — Telephone Encounter (Signed)
 Requested medication (s) are due for refill today: yes  Requested medication (s) are on the active medication list: yes  Last refill:  05/24/24 30  Future visit scheduled: no  Notes to clinic:  per telemedicine visit 06/19/24 Serraline  dose increased to 100 mg.   Requested Prescriptions  Pending Prescriptions Disp Refills   sertraline  (ZOLOFT ) 50 MG tablet [Pharmacy Med Name: Sertraline  HCl 50 MG Oral Tablet] 30 tablet 0    Sig: Take 1 tablet by mouth once daily     Psychiatry:  Antidepressants - SSRI - sertraline  Passed - 06/20/2024  1:41 PM      Passed - AST in normal range and within 360 days    AST  Date Value Ref Range Status  10/28/2023 21 15 - 41 U/L Final         Passed - ALT in normal range and within 360 days    ALT  Date Value Ref Range Status  10/28/2023 33 0 - 44 U/L Final         Passed - Completed PHQ-2 or PHQ-9 in the last 360 days      Passed - Valid encounter within last 6 months    Recent Outpatient Visits           Yesterday GAD (generalized anxiety disorder)   [place] [facility] Duanne Butler DASEN, MD   1 month ago Hypertension, unspecified type   [place] [facility] Kayla Jeoffrey RAMAN, FNP   10 months ago Hypogonadism in male   [place] [facility] Pickard, Butler DASEN, MD   1 year ago Current severe episode of major depressive disorder without psychotic features, unspecified whether recurrent [facility])   [place] [facility] Pickard, Butler DASEN, MD   1 year ago MDD (major depressive disorder), recurrent episode, moderate (HCC)   [place] [facility] Pickard, Butler DASEN, MD

## 2024-06-30 ENCOUNTER — Encounter: Payer: Self-pay | Admitting: Family Medicine

## 2024-07-15 ENCOUNTER — Other Ambulatory Visit: Payer: Self-pay | Admitting: Family Medicine

## 2024-07-17 NOTE — Telephone Encounter (Signed)
 Requested medication (s) are due for refill today: yes  Requested medication (s) are on the active medication list: yes  Last refill:  06/01/24  Future visit scheduled: no  Notes to clinic:  Unable to refill per protocol, cannot delegate.      Requested Prescriptions  Pending Prescriptions Disp Refills   clonazePAM  (KLONOPIN ) 1 MG tablet [Pharmacy Med Name: clonazePAM  1 MG Oral Tablet] 90 tablet 0    Sig: TAKE 1 TABLET BY MOUTH THREE TIMES DAILY AS NEEDED FOR ANXIETY     Not Delegated - Psychiatry: Anxiolytics/Hypnotics 2 Failed - 07/17/2024  4:24 PM      Failed - This refill cannot be delegated      Failed - Urine Drug Screen completed in last 360 days      Passed - Patient is not pregnant      Passed - Valid encounter within last 6 months    Recent Outpatient Visits           4 weeks ago GAD (generalized anxiety disorder)   [place] [facility] Duanne Butler DASEN, MD   2 months ago Hypertension, unspecified type   [place] [facility] Kayla Jeoffrey RAMAN, FNP   11 months ago Hypogonadism in male   [place] [facility] Pickard, Butler DASEN, MD   1 year ago Current severe episode of major depressive disorder without psychotic features, unspecified whether recurrent [facility])   [place] [facility] Pickard, Butler DASEN, MD   1 year ago MDD (major depressive disorder), recurrent episode, moderate (HCC)   [place] [facility] Pickard, Butler DASEN, MD

## 2024-08-02 NOTE — Addendum Note (Signed)
 Addended by: KAYLA JEOFFREY RAMAN on: 08/02/2024 03:50 PM   Modules accepted: Level of Service

## 2024-08-28 ENCOUNTER — Encounter: Payer: Self-pay | Admitting: Family Medicine

## 2024-08-29 ENCOUNTER — Ambulatory Visit: Admitting: Family Medicine

## 2024-08-29 VITALS — BP 160/110 | HR 99 | Temp 98.6°F | Ht 69.0 in | Wt 268.4 lb

## 2024-08-29 DIAGNOSIS — R1024 Suprapubic pain: Secondary | ICD-10-CM

## 2024-08-29 DIAGNOSIS — R3129 Other microscopic hematuria: Secondary | ICD-10-CM | POA: Diagnosis not present

## 2024-08-29 DIAGNOSIS — R3915 Urgency of urination: Secondary | ICD-10-CM | POA: Diagnosis not present

## 2024-08-29 LAB — MICROSCOPIC MESSAGE

## 2024-08-29 LAB — URINALYSIS, ROUTINE W REFLEX MICROSCOPIC
Bacteria, UA: NONE SEEN /HPF
Bilirubin Urine: NEGATIVE
Glucose, UA: NEGATIVE
Hyaline Cast: NONE SEEN /LPF
Ketones, ur: NEGATIVE
Leukocytes,Ua: NEGATIVE
Nitrite: NEGATIVE
Specific Gravity, Urine: 1.01 (ref 1.001–1.035)
Squamous Epithelial / HPF: NONE SEEN /HPF (ref <=5)
WBC, UA: NONE SEEN /HPF (ref 0–5)
pH: 6 (ref 5.0–8.0)

## 2024-08-29 MED ORDER — CIPROFLOXACIN HCL 500 MG PO TABS: 500.0000 mg | ORAL_TABLET | Freq: Two times a day (BID) | ORAL | 0 refills | Status: AC

## 2024-08-29 MED ORDER — TAMSULOSIN HCL 0.4 MG PO CAPS: 0.4000 mg | ORAL_CAPSULE | Freq: Every day | ORAL | 0 refills | Status: AC

## 2024-08-29 NOTE — Progress Notes (Unsigned)
 Patient Office Visit  Assessment & Plan:  Microscopic hematuria -     Urinalysis, Routine w reflex microscopic -     Urine Culture -     CBC with Differential/Platelet -     Comprehensive metabolic panel with GFR -     Microscopic Message -     Ciprofloxacin  HCl; Take 1 tablet (500 mg total) by mouth 2 (two) times daily for 10 days.  Dispense: 20 tablet; Refill: 0 -     CT ABDOMEN PELVIS W CONTRAST; Future -     Tamsulosin  HCl; Take 1 capsule (0.4 mg total) by mouth daily.  Dispense: 30 capsule; Refill: 0  Urinary urgency -     Urinalysis, Routine w reflex microscopic -     Urine Culture -     CBC with Differential/Platelet -     Comprehensive metabolic panel with GFR -     Microscopic Message -     Ciprofloxacin  HCl; Take 1 tablet (500 mg total) by mouth 2 (two) times daily for 10 days.  Dispense: 20 tablet; Refill: 0 -     CT ABDOMEN PELVIS W CONTRAST; Future  Suprapubic pain -     CT ABDOMEN PELVIS W CONTRAST; Future -     Tamsulosin  HCl; Take 1 capsule (0.4 mg total) by mouth daily.  Dispense: 30 capsule; Refill: 0   Assessment and Plan    Hematuria with urinary frequency, urgency, and penile pain Microscopic hematuria with urinary frequency, urgency, and penile pain. Differential includes UTI and possible prostate issue. Urine analysis shows blood, minimal white cells or bacteria. Risk of pyelonephritis if untreated. - Send urine for culture. - Prescribe Cipro  for suspected UTI. - Order blood work for kidney function and white cell count.  Essential hypertension Essential hypertension with elevated blood pressure, likely exacerbated by pain. Not taking antihypertensive medication. Discussed importance of blood pressure management to prevent complications. - Encourage resumption of antihypertensive medication.      Follow up on lab work and notify patient. Patient will let us  know if he worsens in any way, may need to go to ED.   Ordered CT scan in [place]. If  positive for stone, patient may need to see Urology re this.  Return if symptoms worsen or fail to improve.    Subjective:    Patient ID: Robert Mcpherson, male    DOB: 1976-02-21  Age: 48 y.o. MRN: [id number]  Chief Complaint  Patient presents with   Dysuria   Urinary Frequency    Dysuria  Associated symptoms include frequency.  Urinary Frequency  Associated symptoms include frequency.       History of Present Illness Robert Mcpherson is a 48 year old male who presents with urinary frequency/urgency and penile/suprapubic pain.  He has been experiencing urinary urgency every three to five minutes, accompanied by constant penile pain rated as a 'five or six' on a scale of zero to ten, since Friday.  No testicular pain, fever, chills, or nausea/vomiting. No gross hematuria. He has not taken any medication for the pain due to concerns about his high blood pressure.   He has a history of kidney stones from two and a half years ago. He is not experiencing back or flank pain or pain radiating to the groin. He has never had urinary tract infections but suspected one due to his symptoms and increased his water intake.  His blood pressure is elevated, which he attributes to the pain. He has not  been taking his prescribed blood pressure medication, despite having refills available, because he prefers to avoid taking multiple medications. He is currently taking fish oil, Valerian root for sleep, magnesium , Zoloft  at night, and testosterone  in the morning.  He is managing his symptoms while taking care of his four children, as his wife is currently hospitalized. Despite his discomfort, he continues to work, owning a geographical information systems officer. He reports difficulty sleeping, getting only two to three hours of sleep due to the urgency and pain.  No burning sensation during urination, though he experiences frequency and urgency. He has no history of prostate problems and denies any low back pain. He is  allergic to cephalexin. Patient has had urinary frequency and urgency  and deep pain over suprapubic area/inside his penis since Friday.  No gross hematuria.  Patient does have a previous history of kidney stones however he thinks that this is different since he has not seen any blood.  Not having any back pain no nausea vomiting fever or chills.  Has been trying to drink plenty water.  Does not have any concerns regarding sexual transmitted infections and has not had a previous history of prostate issues or previous urinary tract infections.  Patient did not want to go to urgent care or ER on Friday because his wife is in the hospital at this time.  Patient also cares for 4 children Works full-time as a administrator.  Patient did not take anything for pain because he does not want his blood pressure to be affected by it. Patient does have a history of hypertension however has not taken his lisinopril  in the long time.  Patient is elevated today because of his pain level.  Patient with states his pain level is a 5 out of 10 at this time.  Patient has been under stress due to caretaking of his children wife is in the hospital and working full-time. Results LABS Urinalysis: Microscopic hematuria, no significant leukocytes, no significant bacteria (08/29/2024)  RADIOLOGY CT scan abdomen: Colonic inflammation (10/2023)  Assessment and Plan Hematuria with urinary frequency, urgency, and penile pain Microscopic hematuria with urinary frequency, urgency, and penile pain. Differential includes UTI and possible prostate issue. Urine analysis shows blood, minimal white cells or bacteria. Risk of pyelonephritis if untreated. - Send urine for culture. - Prescribe Cipro  for suspected UTI. - Order blood work for kidney function and white cell count.  Essential hypertension uncontrolled Essential hypertension with elevated blood pressure, likely exacerbated by pain. Not taking antihypertensive medication. Discussed  importance of blood pressure management to prevent complications. - Encourage resumption of antihypertensive medication. 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   Nasal turbinate hypertrophy 09/2013   OCD (obsessive compulsive disorder) 04/02/2014   Sensation of fullness in right ear 09/13/2018   Sleep apnea    Snoring 09/13/2018   Past Surgical History:  Procedure Laterality Date   APPENDECTOMY     LUMBAR LAMINECTOMY/DECOMPRESSION MICRODISCECTOMY Left 12/02/2017   Procedure: Left Lumbar 4-5 disectomy;  Surgeon: Burnetta Aures, MD;  Location: [facility] OR;  Service: Orthopedics;  Laterality: Left;  2 hrs   NASAL SEPTOPLASTY W/ TURBINOPLASTY Bilateral 10/02/2013   Procedure: BILATERAL NASAL SEPTOPLASTY WITH TURBINATE REDUCTION;  Surgeon: Ida Loader, MD;  Location:  [facility];  Service: ENT;  Laterality: Bilateral;   SHOULDER ARTHROSCOPY Right 11/2013   TONSILLECTOMY Bilateral 10/14/2023   Procedure: TONSILLECTOMY;  Surgeon: Carlie Clark, MD;  Location: [facility] OR;  Service: ENT;  Laterality: Bilateral;   VASECTOMY  06/02/2013   Social History   Tobacco Use   Smoking status: Never   Smokeless tobacco: Never  Vaping Use   Vaping status: Never Used  Substance Use Topics   Alcohol use: Not Currently    Comment: history of alcohol use in his 35s, none now    Drug use: Not Currently    Types: Marijuana   Family History  Problem Relation Age of Onset   Heart disease Maternal Uncle 52       heart attack   Heart disease Maternal Uncle 33       CVA   Colon cancer Neg Hx    Colon polyps Neg Hx    Liver disease Neg Hx    Allergies  Allergen Reactions   Cephalexin Hives    Review of Systems  Genitourinary:  Positive for dysuria and frequency.      Objective:    BP (!)  160/110   Pulse 99   Temp 98.6 F (37 C)   Ht 5' 9 (1.753 m)   Wt 268 lb 6 oz (121.7 kg)   SpO2 97%   BMI 39.63 kg/m  BP Readings from Last 3 Encounters:  08/29/24 (!) 160/110  10/28/23 (!) 156/91  10/15/23 124/84   Wt Readings from Last 3 Encounters:  08/29/24 268 lb 6 oz (121.7 kg)  10/28/23 257 lb 15 oz (117 kg)  10/15/23 261 lb 14.5 oz (118.8 kg)    Physical Exam Vitals and nursing note reviewed.  Constitutional:      Appearance: Normal appearance. He is obese.     Comments: patient looks uncomfortable sitting in the chair.  HENT:     Head: Normocephalic.     Right Ear: Tympanic membrane, ear canal and external ear normal.     Left Ear: Tympanic membrane, ear canal and external ear normal.  Eyes:     Extraocular Movements: Extraocular movements intact.     Pupils: Pupils are equal, round, and reactive to light.  Cardiovascular:     Rate and Rhythm: Regular rhythm. Tachycardia present.     Heart sounds: Normal heart sounds.  Pulmonary:     Effort: Pulmonary effort is normal.     Breath sounds: Normal breath sounds. No wheezing.  Abdominal:     Tenderness: There is no abdominal tenderness. There is no right CVA tenderness or left CVA tenderness.  Genitourinary:    Penis: No erythema, tenderness, discharge or swelling.      Testes: Normal.        Right: Tenderness not present.        Left: Tenderness not present.     Epididymis:     Right: Normal.     Left: Normal.  Musculoskeletal:     Right lower leg: No edema.     Left lower leg: No edema.  Neurological:     General: No focal deficit present.     Mental Status: He is alert and oriented to person, place, and time.  Psychiatric:        Mood and Affect: Mood normal.        Behavior: Behavior normal.      Results for orders placed or performed in visit on 08/29/24  Urinalysis, Routine w reflex  microscopic  Result Value Ref Range   Color, Urine YELLOW YELLOW   APPearance CLEAR CLEAR   Specific  Gravity, Urine 1.010 1.001 - 1.035   pH 6.0 5.0 - 8.0   Glucose, UA NEGATIVE NEGATIVE   Bilirubin Urine NEGATIVE NEGATIVE   Ketones, ur NEGATIVE NEGATIVE   Hgb urine dipstick 1+ (A) NEGATIVE   Protein, ur TRACE (A) NEGATIVE   Nitrite NEGATIVE NEGATIVE   Leukocytes,Ua NEGATIVE NEGATIVE   WBC, UA NONE SEEN 0 - 5 /HPF   RBC / HPF 20-40 (A) 0 - 2 /HPF   Squamous Epithelial / HPF NONE SEEN < OR = 5 /HPF   Bacteria, UA NONE SEEN NONE SEEN /HPF   Hyaline Cast NONE SEEN NONE SEEN /LPF  CBC with Differential/Platelet  Result Value Ref Range   WBC 8.8 3.8 - 10.8 Thousand/uL   RBC 5.49 4.20 - 5.80 Million/uL   Hemoglobin 16.5 13.2 - 17.1 g/dL   HCT 52.1 61.4 - 49.9 %   MCV 87.1 80.0 - 100.0 fL   MCH 30.1 27.0 - 33.0 pg   MCHC 34.5 32.0 - 36.0 g/dL   RDW 86.3 88.9 - 84.9 %   Platelets 269 140 - 400 Thousand/uL   MPV 10.7 7.5 - 12.5 fL   Neutro Abs 5,799 1,500 - 7,800 cells/uL   Absolute Lymphocytes 2,226 850 - 3,900 cells/uL   Absolute Monocytes 572 200 - 950 cells/uL   Eosinophils Absolute 150 15 - 500 cells/uL   Basophils Absolute 53 0 - 200 cells/uL   Neutrophils Relative % 65.9 %   Total Lymphocyte 25.3 %   Monocytes Relative 6.5 %   Eosinophils Relative 1.7 %   Basophils Relative 0.6 %  Comprehensive metabolic panel with GFR  Result Value Ref Range   Glucose, Bld 105 (H) 65 - 99 mg/dL   BUN 16 7 - 25 mg/dL   Creat 8.99 9.39 - 8.70 mg/dL   eGFR 93 > OR = 60 fO/fpw/8.26f7   BUN/Creatinine Ratio SEE NOTE: 6 - 22 (calc)   Sodium 138 135 - 146 mmol/L   Potassium 4.3 3.5 - 5.3 mmol/L   Chloride 100 98 - 110 mmol/L   CO2 29 20 - 32 mmol/L   Calcium  9.3 8.6 - 10.3 mg/dL   Total Protein 6.8 6.1 - 8.1 g/dL   Albumin 4.7 3.6 - 5.1 g/dL   Globulin 2.1 1.9 - 3.7 g/dL (calc)   AG Ratio 2.2 1.0 - 2.5 (calc)   Total Bilirubin 0.4 0.2 - 1.2 mg/dL   Alkaline phosphatase (APISO) 57 36 - 130 U/L   AST 24 10 - 40 U/L   ALT 58 (H) 9 - 46 U/L  Microscopic Message  Result Value Ref Range    Note

## 2024-08-30 ENCOUNTER — Ambulatory Visit: Payer: Self-pay | Admitting: Family Medicine

## 2024-08-30 ENCOUNTER — Other Ambulatory Visit: Payer: Self-pay

## 2024-08-30 DIAGNOSIS — R1024 Suprapubic pain: Secondary | ICD-10-CM

## 2024-08-30 DIAGNOSIS — R3129 Other microscopic hematuria: Secondary | ICD-10-CM

## 2024-08-30 DIAGNOSIS — R3915 Urgency of urination: Secondary | ICD-10-CM

## 2024-08-30 LAB — CBC WITH DIFFERENTIAL/PLATELET
Absolute Lymphocytes: 2226 {cells}/uL (ref 850–3900)
Absolute Monocytes: 572 {cells}/uL (ref 200–950)
Basophils Absolute: 53 {cells}/uL (ref 0–200)
Basophils Relative: 0.6 %
Eosinophils Absolute: 150 {cells}/uL (ref 15–500)
Eosinophils Relative: 1.7 %
HCT: 47.8 % (ref 38.5–50.0)
Hemoglobin: 16.5 g/dL (ref 13.2–17.1)
MCH: 30.1 pg (ref 27.0–33.0)
MCHC: 34.5 g/dL (ref 32.0–36.0)
MCV: 87.1 fL (ref 80.0–100.0)
MPV: 10.7 fL (ref 7.5–12.5)
Monocytes Relative: 6.5 %
Neutro Abs: 5799 {cells}/uL (ref 1500–7800)
Neutrophils Relative %: 65.9 %
Platelets: 269 Thousand/uL (ref 140–400)
RBC: 5.49 Million/uL (ref 4.20–5.80)
RDW: 13.6 % (ref 11.0–15.0)
Total Lymphocyte: 25.3 %
WBC: 8.8 Thousand/uL (ref 3.8–10.8)

## 2024-08-30 LAB — COMPREHENSIVE METABOLIC PANEL WITH GFR
AG Ratio: 2.2 (calc) (ref 1.0–2.5)
ALT: 58 U/L — ABNORMAL HIGH (ref 9–46)
AST: 24 U/L (ref 10–40)
Albumin: 4.7 g/dL (ref 3.6–5.1)
Alkaline phosphatase (APISO): 57 U/L (ref 36–130)
BUN: 16 mg/dL (ref 7–25)
CO2: 29 mmol/L (ref 20–32)
Calcium: 9.3 mg/dL (ref 8.6–10.3)
Chloride: 100 mmol/L (ref 98–110)
Creat: 1 mg/dL (ref 0.60–1.29)
Globulin: 2.1 g/dL (ref 1.9–3.7)
Glucose, Bld: 105 mg/dL — ABNORMAL HIGH (ref 65–99)
Potassium: 4.3 mmol/L (ref 3.5–5.3)
Sodium: 138 mmol/L (ref 135–146)
Total Bilirubin: 0.4 mg/dL (ref 0.2–1.2)
Total Protein: 6.8 g/dL (ref 6.1–8.1)
eGFR: 93 mL/min/1.73m2 (ref >=60)

## 2024-08-30 LAB — URINE CULTURE
MICRO NUMBER:: 17157708
Result:: NO GROWTH
SPECIMEN QUALITY:: ADEQUATE

## 2024-09-11 ENCOUNTER — Other Ambulatory Visit: Payer: Self-pay | Admitting: Family Medicine

## 2024-09-13 NOTE — Telephone Encounter (Signed)
 Requested medication (s) are due for refill today: Yes  Requested medication (s) are on the active medication list: Yes  Last refill:  07/18/24  Future visit scheduled: No  Notes to clinic:  Unable to refill per protocol, cannot delegate.      Requested Prescriptions  Pending Prescriptions Disp Refills   clonazePAM  (KLONOPIN ) 1 MG tablet [Pharmacy Med Name: clonazePAM  1 MG Oral Tablet] 90 tablet 0    Sig: TAKE 1 TABLET BY MOUTH THREE TIMES DAILY AS NEEDED FOR ANXIETY     Not Delegated - Psychiatry: Anxiolytics/Hypnotics 2 Failed - 09/13/2024  5:15 PM      Failed - This refill cannot be delegated      Failed - Urine Drug Screen completed in last 360 days      Passed - Patient is not pregnant      Passed - Valid encounter within last 6 months    Recent Outpatient Visits           2 weeks ago Microscopic hematuria   [place] [facility] Aletha Bene, MD   2 months ago GAD (generalized anxiety disorder)   [place] [facility] Duanne Butler DASEN, MD   4 months ago Hypertension, unspecified type   [place] [facility] Kayla Jeoffrey RAMAN, FNP   1 year ago Hypogonadism in male   [place] [facility] Family Medicine Pickard, Butler DASEN, MD   1 year ago Current severe episode of major depressive disorder without psychotic features, unspecified whether recurrent [facility])   [place] [facility] Pickard, Butler DASEN, MD

## 2024-10-02 ENCOUNTER — Encounter: Payer: Self-pay | Admitting: Family Medicine

## 2024-10-12 ENCOUNTER — Ambulatory Visit: Admitting: Family Medicine

## 2024-10-12 ENCOUNTER — Encounter: Payer: Self-pay | Admitting: Family Medicine

## 2024-10-12 VITALS — BP 146/98 | HR 103 | Temp 98.4°F | Ht 69.0 in | Wt 274.0 lb

## 2024-10-12 DIAGNOSIS — Z6841 Body Mass Index (BMI) 40.0 and over, adult: Secondary | ICD-10-CM

## 2024-10-12 DIAGNOSIS — R030 Elevated blood-pressure reading, without diagnosis of hypertension: Secondary | ICD-10-CM

## 2024-10-12 DIAGNOSIS — G4733 Obstructive sleep apnea (adult) (pediatric): Secondary | ICD-10-CM | POA: Diagnosis not present

## 2024-10-12 DIAGNOSIS — E291 Testicular hypofunction: Secondary | ICD-10-CM | POA: Diagnosis not present

## 2024-10-12 DIAGNOSIS — F4000 Agoraphobia, unspecified: Secondary | ICD-10-CM

## 2024-10-12 MED ORDER — HYDROXYZINE PAMOATE 25 MG PO CAPS: 25.0000 mg | ORAL_CAPSULE | Freq: Three times a day (TID) | ORAL | 0 refills | Status: AC | PRN

## 2024-10-12 MED ORDER — WEGOVY 0.25 MG/0.5ML ~~LOC~~ SOAJ: 0.2500 mg | SUBCUTANEOUS | 1 refills | Status: DC

## 2024-10-12 NOTE — Progress Notes (Signed)
 Subjective:    Patient ID: Robert Mcpherson, male    DOB: October 25, 1976, 48 y.o.   MRN: [id number]  Sleep study in 2019: IMPRESSION: Severe OSA  RECOMMENDATION: This home sleep test demonstrates severe  obstructive sleep apnea with a total AHI of 57.5/hour and O2  nadir of 76%.   Per patient's report AHI was 81 in 8/24 at ENT, Tonsils removed 12/24.  Repeat sleep study in 3/25 and AHI was 54.   Patient is struggling to lose weight.  He is working on diet and exercise however he has been unsuccessful.  His weight is over 274 pounds.  His blood pressure today is elevated at 146/98.  Patient is very anxious.  He has agoraphobia.  He has to take a Klonopin  to be in crowds.  He is trying to wean himself away from Klonopin  because he is taking the medication for over 16 years.  He would like something that he can use to go out in public that would not be a benzo that may help with anxiety.  Lastly, the patient has hypogonadism and is currently on testosterone  replacement however he reports feeling tired with no energy, low sex drive, and issues with erections.  He questions whether he would do better from testosterone  injections. 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   Nasal turbinate hypertrophy 09/2013   OCD (obsessive compulsive disorder) 04/02/2014   Sensation of fullness in right ear 09/13/2018   Sleep apnea    Snoring 09/13/2018   Past Surgical History:  Procedure Laterality Date   APPENDECTOMY     LUMBAR LAMINECTOMY/DECOMPRESSION MICRODISCECTOMY Left 12/02/2017   Procedure: Left Lumbar 4-5 disectomy;  Surgeon: Burnetta Aures, MD;  Location: [facility] OR;  Service: Orthopedics;  Laterality: Left;  2 hrs   NASAL SEPTOPLASTY W/ TURBINOPLASTY Bilateral 10/02/2013   Procedure: BILATERAL NASAL SEPTOPLASTY WITH TURBINATE REDUCTION;  Surgeon: Ida Loader, MD;   Location: [facility];  Service: ENT;  Laterality: Bilateral;   SHOULDER ARTHROSCOPY Right 11/2013   TONSILLECTOMY Bilateral 10/14/2023   Procedure: TONSILLECTOMY;  Surgeon: Carlie Clark, MD;  Location: [facility] OR;  Service: ENT;  Laterality: Bilateral;   VASECTOMY  06/02/2013   Current Outpatient Medications on File Prior to Visit  Medication Sig Dispense Refill   clonazePAM  (KLONOPIN ) 1 MG tablet TAKE 1 TABLET BY MOUTH THREE TIMES DAILY AS NEEDED FOR ANXIETY 90 tablet 0   EPINEPHrine  0.3 mg/0.3 mL IJ SOAJ injection Inject 0.3 mg into the muscle as needed for anaphylaxis. 1 each 1   lisinopril  (ZESTRIL ) 10 MG tablet Take 1 tablet (10 mg total) by mouth daily. (Patient not taking: Reported on 08/30/2024) 90 tablet 3   sertraline  (ZOLOFT ) 100 MG tablet Take 1 tablet (100 mg total) by mouth daily. 30 tablet 3   sertraline  (ZOLOFT ) 50 MG tablet Take 1 tablet by mouth once daily 30 tablet 0   tamsulosin  (FLOMAX ) 0.4 MG CAPS capsule Take 1 capsule (0.4 mg total) by mouth daily. 30 capsule 0   Testosterone  20.25 MG/ACT (1.62%) GEL Place 2 Pump onto the skin daily. 75 g 5   No current facility-administered medications on file prior to visit.   Allergies  Allergen Reactions   Cephalexin Hives   Social History   Socioeconomic History   Marital status: Married  Spouse name: Not on file   Number of children: Not on file   Years of education: Not on file   Highest education level: Some college, no degree  Occupational History   Not on file  Tobacco Use   Smoking status: Never   Smokeless tobacco: Never  Vaping Use   Vaping status: Never Used  Substance and Sexual Activity   Alcohol use: Not Currently    Comment: history of alcohol use in his 59s, none now    Drug use: Not Currently    Types: Marijuana   Sexual activity: Yes  Other Topics Concern   Not on file  Social History Narrative   Not on file   Social Drivers of Health   Tobacco Use: Low Risk (10/12/2024)    Patient History    Smoking Tobacco Use: Never    Smokeless Tobacco Use: Never    Passive Exposure: Not on file  Financial Resource Strain: Medium Risk (08/29/2024)   Overall Financial Resource Strain (CARDIA)    Difficulty of Paying Living Expenses: Somewhat hard  Food Insecurity: Food Insecurity Present (08/29/2024)   Epic    Worried About Programme Researcher, Broadcasting/film/video in the Last Year: Sometimes true    Ran Out of Food in the Last Year: Never true  Transportation Needs: No Transportation Needs (08/29/2024)   Epic    Lack of Transportation (Medical): No    Lack of Transportation (Non-Medical): No  Physical Activity: Inactive (08/29/2024)   Exercise Vital Sign    Days of Exercise per Week: 0 days    Minutes of Exercise per Session: Not on file  Stress: Stress Concern Present (08/29/2024)   [employer] of Occupational Health - Occupational Stress Questionnaire    Feeling of Stress: Very much  Social Connections: Socially Isolated (08/29/2024)   Social Connection and Isolation Panel    Frequency of Communication with Friends and Family: Never    Frequency of Social Gatherings with Friends and Family: Never    Attends Religious Services: Never    Database Administrator or Organizations: No    Attends Engineer, Structural: Not on file    Marital Status: Married  Catering Manager Violence: Not At Risk (10/25/2023)   Humiliation, Afraid, Rape, and Kick questionnaire    Fear of Current or Ex-Partner: No    Emotionally Abused: No    Physically Abused: No    Sexually Abused: No  Depression (PHQ2-9): Low Risk (08/02/2023)   Depression (PHQ2-9)    PHQ-2 Score: 0  Alcohol Screen: Low Risk (08/29/2024)   Alcohol Screen    Last Alcohol Screening Score (AUDIT): 1  Housing: Low Risk (08/29/2024)   Epic    Unable to Pay for Housing in the Last Year: No    Number of Times Moved in the Last Year: 0    Homeless in the Last Year: No  Utilities: Not At Risk (10/25/2023)   [facility] Utilities     Threatened with loss of utilities: No  Health Literacy: Not on file      Review of Systems  All other systems reviewed and are negative.      Objective:   Physical Exam Vitals reviewed.  Constitutional:      Appearance: Normal appearance. He is obese.  Cardiovascular:     Rate and Rhythm: Normal rate and regular rhythm.     Heart sounds: No murmur heard.    No gallop.  Pulmonary:     Effort: Pulmonary effort is normal. No  respiratory distress.     Breath sounds: Normal breath sounds. No wheezing, rhonchi or rales.  Abdominal:     General: Bowel sounds are normal. There is no distension.     Palpations: Abdomen is soft. There is no mass.     Tenderness: There is no abdominal tenderness. There is no guarding.  Neurological:     Mental Status: He is alert.           Assessment & Plan:  BMI 40.0-44.9, adult (HCC) - Plan: semaglutide -weight management (WEGOVY ) 0.25 MG/0.5ML SOAJ SQ injection  Obstructive sleep apnea syndrome - Plan: semaglutide -weight management (WEGOVY ) 0.25 MG/0.5ML SOAJ SQ injection  Elevated blood pressure reading - Plan: semaglutide -weight management (WEGOVY ) 0.25 MG/0.5ML SOAJ SQ injection  Hypogonadism in male - Plan: Testosterone  Total,Free,Bio, Males  Agoraphobia BMI qualifies for weight loss medication.  The patient has severe obstructive sleep apnea with an apnea hypopnea index of 54 events per hour.  He is trying exercise, dietary changes but has been unsuccessful for weight loss.  He would like to try Wegovy .  Begin Wegovy  0.25 mg subcu weekly and uptitrate on a monthly basis as tolerated.  Hopefully this would help lower his blood pressure as well.  Regarding his hypogonadism, I have recommended that he come in fasting in the morning so that we can check a testosterone  level.  If low, the patient may benefit from switching to testosterone  injections.  If his testosterone  level is normal, a different route of administration would likely not  make any difference.  Lastly regarding his agoraphobia I recommended trying hydroxyzine  25 to 50 mg every 8 hours as needed for panic attacks.  Continue to try to wean away from Klonopin 

## 2024-10-13 ENCOUNTER — Telehealth: Payer: Self-pay | Admitting: Pharmacy Technician

## 2024-10-13 ENCOUNTER — Other Ambulatory Visit (HOSPITAL_COMMUNITY): Payer: Self-pay

## 2024-10-13 NOTE — Telephone Encounter (Signed)
 Pharmacy Patient Advocate Encounter   Received notification from Onbase that prior authorization for Zepbound  2.5MG /0.5ML pen-injectors is required/requested.   Insurance verification completed.   The patient is insured through [employer].   Per test claim: PA required; PA started via CoverMyMeds. KEY [id number] . Waiting for clinical questions to populate.   Pharmacy Patient Advocate Encounter  Effective October 1st, [place] discontinued coverage of GLP1 medications for weight loss (such as Wegovy  and Zepbound ), unless the patient has a documented history of a heart attack or stroke. Zepbound  will continue to be covered only for patients with moderate to severe sleep apnea (AHI 15-30) and a BMI greater than 40. Because of this change, the prior authorization team will not be submitting new PA requests for GLP1 medications prescribed for weight loss, as patients will be unable to continue therapy under Medicaid coverage.  Because of the new medicaid changes I submitted his PA for zepbound  because of his OSA Dx and his BMI.  Medicaid plans are covering Wegovy  for cardivascular with obesity only.

## 2024-10-13 NOTE — Telephone Encounter (Signed)
 Pharmacy Patient Advocate Encounter  Received notification from [employer] that Prior Authorization for Zepbound  2.5MG /0.5ML pen-injectors has been APPROVED from 10/13/24 to 04/11/25. Ran test claim, Copay is $4.00. This test claim was processed through [facility]- copay amounts may vary at other pharmacies due to pharmacy/plan contracts, or as the patient moves through the different stages of their insurance plan.   PA #/Case ID/Reference #: [id number]

## 2024-10-16 ENCOUNTER — Other Ambulatory Visit (HOSPITAL_COMMUNITY): Payer: Self-pay

## 2024-10-18 ENCOUNTER — Other Ambulatory Visit (HOSPITAL_COMMUNITY): Payer: Self-pay

## 2024-10-19 ENCOUNTER — Other Ambulatory Visit: Payer: Self-pay | Admitting: Family Medicine

## 2024-10-19 MED ORDER — ZEPBOUND 2.5 MG/0.5ML ~~LOC~~ SOAJ: 2.5000 mg | SUBCUTANEOUS | 1 refills | Status: DC

## 2024-10-30 ENCOUNTER — Other Ambulatory Visit: Payer: Self-pay | Admitting: Family Medicine

## 2024-11-01 ENCOUNTER — Other Ambulatory Visit (HOSPITAL_COMMUNITY): Payer: Self-pay

## 2024-11-01 ENCOUNTER — Other Ambulatory Visit: Payer: Self-pay

## 2024-11-01 MED ORDER — TIRZEPATIDE 5 MG/0.5ML ~~LOC~~ SOAJ: 5.0000 mg | SUBCUTANEOUS | 1 refills | Status: DC

## 2024-11-03 ENCOUNTER — Telehealth: Payer: Self-pay | Admitting: Pharmacy Technician

## 2024-11-03 ENCOUNTER — Other Ambulatory Visit: Payer: Self-pay

## 2024-11-03 ENCOUNTER — Other Ambulatory Visit (HOSPITAL_COMMUNITY): Payer: Self-pay

## 2024-11-03 MED ORDER — ZEPBOUND 5 MG/0.5ML ~~LOC~~ SOAJ: 5.0000 mg | SUBCUTANEOUS | 1 refills | Status: AC

## 2024-11-03 NOTE — Telephone Encounter (Signed)
 Pharmacy Patient Advocate Encounter   Received notification from Onbase that prior authorization for Mounjaro is required/requested.   Insurance verification completed.   The patient is insured through [employer].   Per test claim: We received PA request for mounjaro but patient was approved for Zepbound , please advise. Thanks!!

## 2024-11-06 NOTE — Telephone Encounter (Signed)
No problem, thank you

## 2024-11-14 ENCOUNTER — Other Ambulatory Visit: Payer: Self-pay | Admitting: Family Medicine
